# Patient Record
Sex: Female | Born: 1960 | Race: Black or African American | Hispanic: No | Marital: Single | Smoking: Never smoker
Health system: Southern US, Community
[De-identification: ages and names within clinical notes are randomized; demographics above are authoritative.]

## PROBLEM LIST (undated history)

## (undated) DIAGNOSIS — Z9289 Personal history of other medical treatment: Secondary | ICD-10-CM

## (undated) DIAGNOSIS — J302 Other seasonal allergic rhinitis: Secondary | ICD-10-CM

## (undated) DIAGNOSIS — R937 Abnormal findings on diagnostic imaging of other parts of musculoskeletal system: Secondary | ICD-10-CM

## (undated) DIAGNOSIS — E119 Type 2 diabetes mellitus without complications: Secondary | ICD-10-CM

## (undated) DIAGNOSIS — Z973 Presence of spectacles and contact lenses: Secondary | ICD-10-CM

## (undated) DIAGNOSIS — D869 Sarcoidosis, unspecified: Secondary | ICD-10-CM

## (undated) DIAGNOSIS — E785 Hyperlipidemia, unspecified: Secondary | ICD-10-CM

## (undated) DIAGNOSIS — IMO0001 Reserved for inherently not codable concepts without codable children: Secondary | ICD-10-CM

## (undated) DIAGNOSIS — M7590 Shoulder lesion, unspecified, unspecified shoulder: Secondary | ICD-10-CM

## (undated) DIAGNOSIS — D709 Neutropenia, unspecified: Secondary | ICD-10-CM

## (undated) DIAGNOSIS — M7520 Bicipital tendinitis, unspecified shoulder: Secondary | ICD-10-CM

## (undated) DIAGNOSIS — I10 Essential (primary) hypertension: Secondary | ICD-10-CM

## (undated) HISTORY — DX: Neutropenia, unspecified: D70.9

## (undated) HISTORY — DX: Sarcoidosis, unspecified: D86.9

## (undated) HISTORY — DX: Essential (primary) hypertension: I10

## (undated) HISTORY — DX: Hyperlipidemia, unspecified: E78.5

## (undated) HISTORY — DX: Shoulder lesion, unspecified, unspecified shoulder: M75.90

## (undated) HISTORY — PX: TONSILLECTOMY: SUR1361

## (undated) HISTORY — DX: Presence of spectacles and contact lenses: Z97.3

## (undated) HISTORY — DX: Bicipital tendinitis, unspecified shoulder: M75.20

## (undated) HISTORY — PX: BACK SURGERY: SHX140

## (undated) HISTORY — DX: Personal history of other medical treatment: Z92.89

## (undated) HISTORY — DX: Abnormal findings on diagnostic imaging of other parts of musculoskeletal system: R93.7

---

## 1985-12-26 HISTORY — PX: LUNG SURGERY: SHX703

## 1986-12-26 DIAGNOSIS — Z9289 Personal history of other medical treatment: Secondary | ICD-10-CM

## 1986-12-26 HISTORY — DX: Personal history of other medical treatment: Z92.89

## 1988-12-26 HISTORY — PX: ABDOMINAL HYSTERECTOMY: SHX81

## 1992-12-26 HISTORY — PX: REDUCTION MAMMAPLASTY: SUR839

## 2004-11-06 ENCOUNTER — Emergency Department (HOSPITAL_COMMUNITY): Admission: AD | Admit: 2004-11-06 | Discharge: 2004-11-06 | Payer: Self-pay | Admitting: Family Medicine

## 2004-11-19 ENCOUNTER — Emergency Department (HOSPITAL_COMMUNITY): Admission: EM | Admit: 2004-11-19 | Discharge: 2004-11-19 | Payer: Self-pay | Admitting: Family Medicine

## 2005-09-14 ENCOUNTER — Emergency Department (HOSPITAL_COMMUNITY): Admission: EM | Admit: 2005-09-14 | Discharge: 2005-09-14 | Payer: Self-pay | Admitting: Family Medicine

## 2006-04-20 ENCOUNTER — Encounter: Admission: RE | Admit: 2006-04-20 | Discharge: 2006-04-20 | Payer: Self-pay | Admitting: Family Medicine

## 2006-04-20 ENCOUNTER — Other Ambulatory Visit: Admission: RE | Admit: 2006-04-20 | Discharge: 2006-04-20 | Payer: Self-pay | Admitting: Family Medicine

## 2006-06-08 ENCOUNTER — Ambulatory Visit: Payer: Self-pay | Admitting: Family Medicine

## 2006-07-06 ENCOUNTER — Ambulatory Visit: Payer: Self-pay | Admitting: Family Medicine

## 2006-08-12 ENCOUNTER — Emergency Department (HOSPITAL_COMMUNITY): Admission: EM | Admit: 2006-08-12 | Discharge: 2006-08-12 | Payer: Self-pay | Admitting: Emergency Medicine

## 2007-03-07 ENCOUNTER — Ambulatory Visit: Payer: Self-pay | Admitting: Family Medicine

## 2007-03-26 ENCOUNTER — Ambulatory Visit: Payer: Self-pay | Admitting: Family Medicine

## 2007-04-24 ENCOUNTER — Ambulatory Visit: Payer: Self-pay | Admitting: Family Medicine

## 2007-05-14 ENCOUNTER — Ambulatory Visit: Payer: Self-pay | Admitting: Family Medicine

## 2007-05-14 ENCOUNTER — Other Ambulatory Visit: Admission: RE | Admit: 2007-05-14 | Discharge: 2007-05-14 | Payer: Self-pay | Admitting: Family Medicine

## 2007-05-14 LAB — HM PAP SMEAR: HM Pap smear: NEGATIVE

## 2007-10-31 ENCOUNTER — Emergency Department (HOSPITAL_COMMUNITY): Admission: EM | Admit: 2007-10-31 | Discharge: 2007-10-31 | Payer: Self-pay | Admitting: Emergency Medicine

## 2007-11-09 ENCOUNTER — Emergency Department (HOSPITAL_COMMUNITY): Admission: EM | Admit: 2007-11-09 | Discharge: 2007-11-09 | Payer: Self-pay | Admitting: Emergency Medicine

## 2007-11-13 ENCOUNTER — Ambulatory Visit: Payer: Self-pay | Admitting: Family Medicine

## 2007-11-27 ENCOUNTER — Ambulatory Visit: Payer: Self-pay | Admitting: Family Medicine

## 2007-11-29 ENCOUNTER — Emergency Department (HOSPITAL_COMMUNITY): Admission: EM | Admit: 2007-11-29 | Discharge: 2007-11-29 | Payer: Self-pay | Admitting: Emergency Medicine

## 2007-12-07 ENCOUNTER — Ambulatory Visit: Payer: Self-pay | Admitting: Family Medicine

## 2008-02-03 ENCOUNTER — Emergency Department (HOSPITAL_COMMUNITY): Admission: EM | Admit: 2008-02-03 | Discharge: 2008-02-03 | Payer: Self-pay | Admitting: Family Medicine

## 2008-02-06 ENCOUNTER — Ambulatory Visit: Payer: Self-pay | Admitting: Family Medicine

## 2008-03-13 ENCOUNTER — Ambulatory Visit: Payer: Self-pay | Admitting: Family Medicine

## 2008-03-27 ENCOUNTER — Ambulatory Visit: Payer: Self-pay | Admitting: Family Medicine

## 2008-08-29 ENCOUNTER — Ambulatory Visit: Payer: Self-pay | Admitting: Family Medicine

## 2008-10-13 ENCOUNTER — Ambulatory Visit: Payer: Self-pay | Admitting: Family Medicine

## 2008-11-12 ENCOUNTER — Ambulatory Visit: Payer: Self-pay | Admitting: Family Medicine

## 2009-04-23 ENCOUNTER — Ambulatory Visit: Payer: Self-pay | Admitting: Family Medicine

## 2009-09-10 ENCOUNTER — Emergency Department (HOSPITAL_COMMUNITY): Admission: EM | Admit: 2009-09-10 | Discharge: 2009-09-11 | Payer: Self-pay | Admitting: Emergency Medicine

## 2009-09-14 ENCOUNTER — Ambulatory Visit: Payer: Self-pay | Admitting: Family Medicine

## 2009-10-09 ENCOUNTER — Ambulatory Visit: Payer: Self-pay | Admitting: Family Medicine

## 2009-12-03 ENCOUNTER — Ambulatory Visit: Payer: Self-pay | Admitting: Family Medicine

## 2010-01-20 ENCOUNTER — Ambulatory Visit: Payer: Self-pay | Admitting: Family Medicine

## 2010-03-22 ENCOUNTER — Ambulatory Visit: Payer: Self-pay | Admitting: Physician Assistant

## 2010-03-25 ENCOUNTER — Ambulatory Visit: Payer: Self-pay | Admitting: Oncology

## 2010-04-08 LAB — MORPHOLOGY
PLT EST: ADEQUATE
RBC Comments: NORMAL

## 2010-04-08 LAB — CBC & DIFF AND RETIC
BASO%: 0.2 % (ref 0.0–2.0)
Basophils Absolute: 0 10*3/uL (ref 0.0–0.1)
EOS%: 2.3 % (ref 0.0–7.0)
Eosinophils Absolute: 0.1 10*3/uL (ref 0.0–0.5)
HCT: 35.9 % (ref 34.8–46.6)
HGB: 12.1 g/dL (ref 11.6–15.9)
Immature Retic Fract: 1.9 % (ref 0.00–10.70)
LYMPH%: 46.3 % (ref 14.0–49.7)
MCH: 29.2 pg (ref 25.1–34.0)
MCHC: 33.7 g/dL (ref 31.5–36.0)
MCV: 86.7 fL (ref 79.5–101.0)
MONO#: 0.3 10*3/uL (ref 0.1–0.9)
MONO%: 6.9 % (ref 0.0–14.0)
NEUT#: 1.9 10*3/uL (ref 1.5–6.5)
NEUT%: 44.3 % (ref 38.4–76.8)
Platelets: 317 10*3/uL (ref 145–400)
RBC: 4.14 10*6/uL (ref 3.70–5.45)
RDW: 13.7 % (ref 11.2–14.5)
Retic %: 1.07 % (ref 0.50–1.50)
Retic Ct Abs: 44.3 10*3/uL (ref 18.30–72.70)
WBC: 4.3 10*3/uL (ref 3.9–10.3)
lymph#: 2 10*3/uL (ref 0.9–3.3)

## 2010-04-08 LAB — COMPREHENSIVE METABOLIC PANEL WITH GFR
ALT: 17 U/L (ref 0–35)
AST: 23 U/L (ref 0–37)
Albumin: 4.3 g/dL (ref 3.5–5.2)
Alkaline Phosphatase: 56 U/L (ref 39–117)
BUN: 10 mg/dL (ref 6–23)
CO2: 31 meq/L (ref 19–32)
Calcium: 9.4 mg/dL (ref 8.4–10.5)
Chloride: 103 meq/L (ref 96–112)
Creatinine, Ser: 0.77 mg/dL (ref 0.40–1.20)
Glucose, Bld: 162 mg/dL — ABNORMAL HIGH (ref 70–99)
Potassium: 3.3 meq/L — ABNORMAL LOW (ref 3.5–5.3)
Sodium: 141 meq/L (ref 135–145)
Total Bilirubin: 0.6 mg/dL (ref 0.3–1.2)
Total Protein: 7.7 g/dL (ref 6.0–8.3)

## 2010-04-08 LAB — CHCC SMEAR

## 2010-04-08 LAB — LACTATE DEHYDROGENASE: LDH: 146 U/L (ref 94–250)

## 2010-04-14 LAB — KAPPA/LAMBDA LIGHT CHAINS
Kappa free light chain: 0.99 mg/dL (ref 0.33–1.94)
Kappa:Lambda Ratio: 0.81 (ref 0.26–1.65)
Lambda Free Lght Chn: 1.22 mg/dL (ref 0.57–2.63)

## 2010-04-14 LAB — IRON AND TIBC
%SAT: 17 % — ABNORMAL LOW (ref 20–55)
Iron: 62 ug/dL (ref 42–145)
TIBC: 375 ug/dL (ref 250–470)
UIBC: 313 ug/dL

## 2010-04-14 LAB — PROTEIN ELECTROPHORESIS, SERUM
Albumin ELP: 59 % (ref 55.8–66.1)
Alpha-1-Globulin: 6.3 % — ABNORMAL HIGH (ref 2.9–4.9)
Alpha-2-Globulin: 8.7 % (ref 7.1–11.8)
Beta 2: 4.4 % (ref 3.2–6.5)
Beta Globulin: 6.8 % (ref 4.7–7.2)
Gamma Globulin: 14.8 % (ref 11.1–18.8)
Total Protein, Serum Electrophoresis: 7.4 g/dL (ref 6.0–8.3)

## 2010-04-14 LAB — VITAMIN B12: Vitamin B-12: 313 pg/mL (ref 211–911)

## 2010-04-14 LAB — TSH: TSH: 2.331 u[IU]/mL (ref 0.350–4.500)

## 2010-04-14 LAB — FOLATE: Folate: 12.7 ng/mL

## 2010-04-14 LAB — FERRITIN: Ferritin: 54 ng/mL (ref 10–291)

## 2010-07-15 ENCOUNTER — Ambulatory Visit: Payer: Self-pay | Admitting: Oncology

## 2010-07-19 LAB — CBC WITH DIFFERENTIAL/PLATELET
BASO%: 0.2 % (ref 0.0–2.0)
Basophils Absolute: 0 10*3/uL (ref 0.0–0.1)
EOS%: 1.6 % (ref 0.0–7.0)
Eosinophils Absolute: 0.1 10*3/uL (ref 0.0–0.5)
HCT: 34.9 % (ref 34.8–46.6)
HGB: 11.8 g/dL (ref 11.6–15.9)
LYMPH%: 45.7 % (ref 14.0–49.7)
MCH: 29.1 pg (ref 25.1–34.0)
MCHC: 33.8 g/dL (ref 31.5–36.0)
MCV: 86.2 fL (ref 79.5–101.0)
MONO#: 0.4 10*3/uL (ref 0.1–0.9)
MONO%: 9.9 % (ref 0.0–14.0)
NEUT#: 1.8 10*3/uL (ref 1.5–6.5)
NEUT%: 42.6 % (ref 38.4–76.8)
Platelets: 300 10*3/uL (ref 145–400)
RBC: 4.05 10*6/uL (ref 3.70–5.45)
RDW: 13.5 % (ref 11.2–14.5)
WBC: 4.3 10*3/uL (ref 3.9–10.3)
lymph#: 2 10*3/uL (ref 0.9–3.3)

## 2010-07-19 LAB — CHCC SMEAR

## 2010-07-29 ENCOUNTER — Ambulatory Visit: Payer: Self-pay | Admitting: Physician Assistant

## 2010-10-04 ENCOUNTER — Encounter: Admission: RE | Admit: 2010-10-04 | Discharge: 2010-10-04 | Payer: Self-pay | Admitting: Family Medicine

## 2010-10-29 ENCOUNTER — Ambulatory Visit: Payer: Self-pay | Admitting: Family Medicine

## 2010-11-09 ENCOUNTER — Ambulatory Visit: Payer: Self-pay | Admitting: Oncology

## 2010-12-08 ENCOUNTER — Ambulatory Visit: Payer: Self-pay | Admitting: Family Medicine

## 2011-01-10 ENCOUNTER — Ambulatory Visit
Admission: RE | Admit: 2011-01-10 | Discharge: 2011-01-10 | Payer: Self-pay | Source: Home / Self Care | Attending: Family Medicine | Admitting: Family Medicine

## 2011-01-17 ENCOUNTER — Emergency Department (HOSPITAL_COMMUNITY)
Admission: EM | Admit: 2011-01-17 | Discharge: 2011-01-18 | Payer: Self-pay | Source: Home / Self Care | Admitting: Emergency Medicine

## 2011-01-21 ENCOUNTER — Ambulatory Visit
Admission: RE | Admit: 2011-01-21 | Discharge: 2011-01-21 | Payer: Self-pay | Source: Home / Self Care | Attending: Family Medicine | Admitting: Family Medicine

## 2011-01-26 DIAGNOSIS — M7520 Bicipital tendinitis, unspecified shoulder: Secondary | ICD-10-CM

## 2011-01-26 DIAGNOSIS — M7590 Shoulder lesion, unspecified, unspecified shoulder: Secondary | ICD-10-CM

## 2011-01-26 HISTORY — DX: Shoulder lesion, unspecified, unspecified shoulder: M75.90

## 2011-01-26 HISTORY — DX: Bicipital tendinitis, unspecified shoulder: M75.20

## 2011-03-11 ENCOUNTER — Other Ambulatory Visit: Payer: Self-pay | Admitting: Oncology

## 2011-03-11 ENCOUNTER — Encounter (HOSPITAL_BASED_OUTPATIENT_CLINIC_OR_DEPARTMENT_OTHER): Payer: 59 | Admitting: Oncology

## 2011-03-11 DIAGNOSIS — I10 Essential (primary) hypertension: Secondary | ICD-10-CM

## 2011-03-11 DIAGNOSIS — E119 Type 2 diabetes mellitus without complications: Secondary | ICD-10-CM

## 2011-03-11 DIAGNOSIS — D6489 Other specified anemias: Secondary | ICD-10-CM

## 2011-03-11 LAB — CBC WITH DIFFERENTIAL/PLATELET
BASO%: 0.1 % (ref 0.0–2.0)
EOS%: 0.8 % (ref 0.0–7.0)
Eosinophils Absolute: 0.1 10*3/uL (ref 0.0–0.5)
HCT: 37.6 % (ref 34.8–46.6)
LYMPH%: 32.9 % (ref 14.0–49.7)
MCH: 29.5 pg (ref 25.1–34.0)
MCHC: 35.1 g/dL (ref 31.5–36.0)
MONO#: 0.6 10*3/uL (ref 0.1–0.9)
NEUT%: 58.1 % (ref 38.4–76.8)
Platelets: 300 10*3/uL (ref 145–400)
RDW: 12.9 % (ref 11.2–14.5)
WBC: 7.3 10*3/uL (ref 3.9–10.3)
lymph#: 2.4 10*3/uL (ref 0.9–3.3)
nRBC: 0 % (ref 0–0)

## 2011-03-11 LAB — COMPREHENSIVE METABOLIC PANEL
ALT: 12 U/L (ref 0–35)
AST: 16 U/L (ref 0–37)
Albumin: 4.5 g/dL (ref 3.5–5.2)
Alkaline Phosphatase: 55 U/L (ref 39–117)
BUN: 18 mg/dL (ref 6–23)
CO2: 27 mEq/L (ref 19–32)
Chloride: 101 mEq/L (ref 96–112)
Creatinine, Ser: 0.86 mg/dL (ref 0.40–1.20)
Potassium: 3.7 mEq/L (ref 3.5–5.3)
Total Bilirubin: 0.7 mg/dL (ref 0.3–1.2)
Total Protein: 7 g/dL (ref 6.0–8.3)

## 2011-04-01 LAB — COMPREHENSIVE METABOLIC PANEL
ALT: 16 U/L (ref 0–35)
AST: 25 U/L (ref 0–37)
Albumin: 4.6 g/dL (ref 3.5–5.2)
Alkaline Phosphatase: 65 U/L (ref 39–117)
BUN: 14 mg/dL (ref 6–23)
CO2: 30 mEq/L (ref 19–32)
Calcium: 9.5 mg/dL (ref 8.4–10.5)
Chloride: 103 mEq/L (ref 96–112)
Creatinine, Ser: 0.76 mg/dL (ref 0.4–1.2)
GFR calc Af Amer: >60 mL/min (ref >60)
GFR calc non Af Amer: >60 mL/min (ref >60)
Glucose, Bld: 152 mg/dL — ABNORMAL HIGH (ref 70–99)
Potassium: 3.7 mEq/L (ref 3.5–5.1)
Sodium: 141 mEq/L (ref 135–145)
Total Bilirubin: 0.7 mg/dL (ref 0.3–1.2)
Total Protein: 8.1 g/dL (ref 6.0–8.3)

## 2011-04-01 LAB — POCT CARDIAC MARKERS
CKMB, poc: 1.1 ng/mL (ref 1.0–8.0)
Myoglobin, poc: 79.3 ng/mL (ref 12–200)
Troponin i, poc: <0.05 ng/mL (ref 0.00–0.09)

## 2011-04-01 LAB — CBC
HCT: 37.5 % (ref 36.0–46.0)
Hemoglobin: 12.7 g/dL (ref 12.0–15.0)
MCHC: 33.9 g/dL (ref 30.0–36.0)
MCV: 88.3 fL (ref 78.0–100.0)
Platelets: 285 10*3/uL (ref 150–400)
RBC: 4.25 MIL/uL (ref 3.87–5.11)
RDW: 13.6 % (ref 11.5–15.5)
WBC: 6.2 10*3/uL (ref 4.0–10.5)

## 2011-04-01 LAB — DIFFERENTIAL
Basophils Absolute: 0 10*3/uL (ref 0.0–0.1)
Basophils Relative: 1 % (ref 0–1)
Eosinophils Absolute: 0.1 10*3/uL (ref 0.0–0.7)
Eosinophils Relative: 2 % (ref 0–5)
Lymphocytes Relative: 50 % — ABNORMAL HIGH (ref 12–46)
Lymphs Abs: 3.1 10*3/uL (ref 0.7–4.0)
Monocytes Absolute: 0.4 10*3/uL (ref 0.1–1.0)
Monocytes Relative: 6 % (ref 3–12)
Neutro Abs: 2.6 10*3/uL (ref 1.7–7.7)
Neutrophils Relative %: 41 % — ABNORMAL LOW (ref 43–77)

## 2011-04-01 LAB — LIPASE, BLOOD: Lipase: 82 U/L — ABNORMAL HIGH (ref 11–59)

## 2011-04-01 LAB — GLUCOSE, CAPILLARY: Glucose-Capillary: 175 mg/dL — ABNORMAL HIGH (ref 70–99)

## 2011-05-20 ENCOUNTER — Encounter: Payer: Self-pay | Admitting: Family Medicine

## 2011-05-20 ENCOUNTER — Ambulatory Visit (INDEPENDENT_AMBULATORY_CARE_PROVIDER_SITE_OTHER): Payer: 59 | Admitting: Family Medicine

## 2011-05-20 ENCOUNTER — Telehealth: Payer: Self-pay | Admitting: Family Medicine

## 2011-05-20 VITALS — BP 110/70 | HR 72 | Temp 97.9°F | Ht 64.0 in | Wt 170.0 lb

## 2011-05-20 DIAGNOSIS — H9201 Otalgia, right ear: Secondary | ICD-10-CM

## 2011-05-20 DIAGNOSIS — H9209 Otalgia, unspecified ear: Secondary | ICD-10-CM

## 2011-05-20 NOTE — Patient Instructions (Signed)
Please call with name of ear drops you have recently been using, and we will let you know the plan later today  Otalgia (Earache) Otalgia is pain in or around the ear. When the pain is from the ear itself it is called primary otalgia. Pain may also be coming from somewhere else, like the head and neck. This is called secondary otalgia.  CAUSES Causes of primary otalgia include:  Middle ear infection.   It can also be caused by injury to the ear or infection of the ear canal (swimmer's ear). Swimmer's ear causes pain, swelling and often drainage from the ear canal.  Causes of secondary otalgia include:  Sinus infections.   Allergies and colds that cause stuffiness of the nose and tubes that drain the ears (eustachian tubes).   Dental problems like cavities, gum infections or teething.   Sore Throat (tonsillitis and pharyngitis).   Swollen glands in the neck.   Infection of the bone behind the ear (mastoiditis).   TMJ discomfort (problems with the joint between your jaw and your skull).   Other problems such as nerve disorders, circulation problems, heart disease and tumors of the head and neck can also cause symptoms of ear pain. This is rare.  DIAGNOSIS Evaluation, Diagnosis and Testing:  Examination by your medical caregiver is recommended to evaluate and diagnose the cause of otalgia.   Further testing or referral to a specialist may be indicated if the cause of the ear pain is not found and the symptom persists.  TREATMENT  Your doctor may prescribe antibiotics if an ear infection is diagnosed.   Pain relievers and topical analgesics may be recommended.   It is important to take all medications as prescribed.  HOME CARE INSTRUCTIONS  It may be helpful to sleep with the painful ear in the up position.   A warm compress over the painful ear may provide relief.   A soft diet and avoiding gum may help while ear pain is present.  SEEK IMMEDIATE MEDICAL CARE IF YOU  DEVELOP:  Severe pain, a high fever, repeated vomiting or dehydration.   Extreme dizziness, headache, confusion, ringing in the ears (tinnitus) or hearing loss.  Document Released: 01/19/2005 Document Re-Released: 11/30/2009 [facility] Patient Information 2011 [place], [place].

## 2011-05-20 NOTE — Telephone Encounter (Signed)
Med is generic Cortisporin otic.  Should be used 4 drops to affected ear 3-4x/day for a total of 10 days.  LM on pt's machine with this info, and to have her call back with which pharmacy she uses so that we can send in a refill so she has enough to complete a course

## 2011-05-20 NOTE — Progress Notes (Signed)
Subjective:    Patient ID: Bethany Webb, female    DOB: June 13, 1961, 50 y.o.   MRN: [id number]  HPI Patient presents with complaint of R ear pain for over a week.  Has been using ear drops left over from last year, helps some, but still hurting.  Doesn't remember the name of the drops; used it once or twice a day over the last week.  Begins with itching/scratching, starts to get sore, then becomes more painful.  Denies cold symptoms.  Allergies are controlled by her Zyrtec, using it daily.    Works at [employer]  Was tent camping over the last week  Past Medical History  Diagnosis Date  . Diabetes mellitus   . Hypertension   . Hyperlipidemia   . Allergy     Past Surgical History  Procedure Date  . Abdominal hysterectomy 1990    and BSO    History   Social History  . Marital Status: Single    Spouse Name: N/A    Number of Children: N/A  . Years of Education: N/A   Occupational History  . Not on file.   Social History Main Topics  . Smoking status: Never Smoker   . Smokeless tobacco: Never Used  . Alcohol Use: Yes     wine 1- 3 times a week  . Drug Use: No  . Sexually Active: Not on file   Other Topics Concern  . Not on file   Social History Narrative  . No narrative on file    History reviewed. No pertinent family history.  Current outpatient prescriptions:amLODipine (NORVASC) 10 MG tablet, Take 10 mg by mouth daily.  , Disp: , Rfl: ;  cetirizine (ZYRTEC) 10 MG tablet, Take 10 mg by mouth daily.  , Disp: , Rfl: ;  glipiZIDE (GLUCOTROL) 10 MG tablet, Take 10 mg by mouth 2 (two) times daily before a meal.  , Disp: , Rfl: ;  lisinopril-hydrochlorothiazide (PRINZIDE,ZESTORETIC) 20-25 MG per tablet, Take 1 tablet by mouth daily.  , Disp: , Rfl:  metFORMIN (GLUCOPHAGE) 500 MG tablet, Take 500 mg by mouth 2 (two) times daily with a meal.  , Disp: , Rfl: ;  rosuvastatin (CRESTOR) 10 MG tablet, Take 10 mg by mouth daily.  , Disp: , Rfl: ;  DISCONTD: simvastatin  (ZOCOR) 40 MG tablet, Take 40 mg by mouth at bedtime.  , Disp: , Rfl:   Allergies  Allergen Reactions  . Codeine Nausea Only   Review of Systems No fevers, nausea/vomiting/diarrhea, no skin rashes, no cough, SOB, sinus pressure.     Objective:   Physical Exam BP 110/70  Pulse 72  Temp(Src) 97.9 F (36.6 C) (Oral)  Ht 5\' 4"  (1.626 m)  Wt 170 lb (77.111 kg)  BMI 29.18 kg/m2 HEENT:  PERRL, EOMI, conjunctiva clear.  TM's normal bilaterally.  Minimal cerumen.  EAC's also appear normal bilaterally without any erythema, inflammation or exudates.  Mildly tender behind ear--no lymphadenopathy, tender over muscles Neck:  No lymphadenopathy or thyromegaly Heart:  Regular rate and rhythm without murmurs Lungs:  Clear bilaterally Extremities:  No CCE       Assessment & Plan:   1. Otalgia of right ear    No evidence of otitis media or externa on exam today.  May have partially treated OE.  Patient will call with name of drops she has been using.    Discussed ear drops with HC Discussed use of NSAIDs prn for neck (behind ear) pain Follow  up if fevers, worsening pain, other symptoms develop for re-evaluation.  Phone call from patient--on cortisporin otic.  See phone message. Recommended using at proper dose (4 ggts TID). F/u prn

## 2011-06-30 ENCOUNTER — Encounter: Payer: Self-pay | Admitting: Medical

## 2011-06-30 ENCOUNTER — Ambulatory Visit (INDEPENDENT_AMBULATORY_CARE_PROVIDER_SITE_OTHER): Payer: 59 | Admitting: Medical

## 2011-06-30 VITALS — BP 122/88 | HR 80 | Temp 98.2°F | Ht 64.0 in | Wt 169.0 lb

## 2011-06-30 DIAGNOSIS — T887XXA Unspecified adverse effect of drug or medicament, initial encounter: Secondary | ICD-10-CM

## 2011-06-30 DIAGNOSIS — D492 Neoplasm of unspecified behavior of bone, soft tissue, and skin: Secondary | ICD-10-CM

## 2011-06-30 DIAGNOSIS — IMO0002 Reserved for concepts with insufficient information to code with codable children: Secondary | ICD-10-CM

## 2011-06-30 DIAGNOSIS — H6690 Otitis media, unspecified, unspecified ear: Secondary | ICD-10-CM

## 2011-06-30 MED ORDER — AMOXICILLIN 875 MG PO TABS: 875.0000 mg | ORAL_TABLET | Freq: Two times a day (BID) | ORAL | Status: AC

## 2011-06-30 NOTE — Progress Notes (Signed)
Subjective:     Bethany Webb is a 50 y.o. female who presents with ear pain and possible ear infection. Symptoms include: right ear pain. Onset of symptoms was 1 month ago, and have been gradually worsening since that time. Associated symptoms include: achiness, sinus pressure and discomfort in right neck.  Patient denies: chills, fever  and sneezing. She is drinking plenty of fluids.  Treatment to date: finished course of cortisporin ear drops.  Denies sick contacts.  No other aggravating or relieving factors.    She also notes skin on tip of nose is darker than surrounding skin that has gotten bigger over months.  It doesn't bother her, but she has tried creams OTC unsuccessfully.    She notes chronic nausea and belly discomfort on Metformin.  Has been on other medications prior.    The following portions of the patient's history were reviewed and updated as appropriate: allergies, current medications, past family history, past medical history, past social history, past surgical history and problem list.  Past Medical History  Diagnosis Date  . Diabetes mellitus   . Hypertension   . Hyperlipidemia   . Allergy     Review of Systems Constitutional: denies chills, sweats, anorexia Skin: denies rash HEENT: +ear pain, denies hearing loss, sore throat, itchy watery eyes, sneezing Cardiovascular: denies chest pain Lungs: denies wheezing, SOB Abdomen: +nausea; denies abdominal pain, vomiting, diarrhea GU: denies dysuria  Objective:   Filed Vitals:   06/30/11 0811  BP: 122/88  Pulse: 80  Temp: 98.2 F (36.8 C)    General appearance: Alert, WD/WN, no distress, mildly ill appearing                             Skin: warm, no rash                           Head: no sinus tenderness                            Eyes: conjunctiva normal, corneas clear, PERRLA                            Ears: left TM normal, right TM retracted and mildly erythema, external ear canals normal              Nose: septum midline, turbinates swollen, with erythema and clear discharge             Mouth/throat: MMM, tongue normal, mild pharyngeal erythema                           Neck: tender on the right, supple, no adenopathy, no thyromegaly                          Heart: RRR, normal S1, S2, no murmurs                         Lungs: CTA bilaterally, no wheezes, rales, or rhonchi     Assessment:   Encounter Diagnoses  Name Primary?  . Otitis media Yes  . Neoplasm of skin of nose   . Adverse effect      Plan:   OM - Prescription for Amoxicillin given as below.  Discussed diagnosis and treatment of otitis media.  Suggested symptomatic OTC remedies.  Nasal saline spray for nasal congestion.  Tylenol or Ibuprofen OTC for fever and malaise.  Call/return in 2-3 days if symptoms aren't resolving.   Skin of nose - referral to derm for eval  Adverse effect - metformin causes her nausea frequently.  Advised she take the medication with food, return soon for diabetes checkup and discuss other medication options.

## 2011-08-22 ENCOUNTER — Other Ambulatory Visit: Payer: Self-pay | Admitting: Family Medicine

## 2011-08-24 ENCOUNTER — Other Ambulatory Visit: Payer: Self-pay

## 2011-09-16 ENCOUNTER — Telehealth: Payer: Self-pay | Admitting: Medical

## 2011-09-16 MED ORDER — ROSUVASTATIN CALCIUM 10 MG PO TABS: 10.0000 mg | ORAL_TABLET | Freq: Every day | ORAL | Status: DC

## 2011-09-16 NOTE — Telephone Encounter (Signed)
PATIENT WAS NOTIFIED THAT SHE WOULD NEED TO COME IN FOR A OFFICE VISIT BUT WE WILL REFILL HER CRESTOR FOR 30 DAYS. SHE AGREEDED. RX REILL WAS SENT IN. CLS

## 2011-10-03 LAB — BASIC METABOLIC PANEL
BUN: 11
Calcium: 9.3
GFR calc non Af Amer: >60
Glucose, Bld: 251 — ABNORMAL HIGH
Sodium: 136

## 2011-10-03 LAB — CBC
HCT: 36.8
MCHC: 35
MCV: 83.2
RBC: 4.42
WBC: 6.3

## 2011-10-03 LAB — DIFFERENTIAL
Basophils Relative: 1
Eosinophils Absolute: 0.1 — ABNORMAL LOW
Eosinophils Relative: 1
Lymphs Abs: 2.3
Monocytes Absolute: 0.4
Monocytes Relative: 7
Neutrophils Relative %: 55

## 2011-10-05 ENCOUNTER — Encounter: Payer: Self-pay | Admitting: Family Medicine

## 2011-10-06 ENCOUNTER — Ambulatory Visit (INDEPENDENT_AMBULATORY_CARE_PROVIDER_SITE_OTHER): Payer: 59 | Admitting: Medical

## 2011-10-06 ENCOUNTER — Encounter: Payer: Self-pay | Admitting: Medical

## 2011-10-06 VITALS — BP 120/80 | HR 84 | Temp 98.2°F | Resp 20 | Ht 64.0 in | Wt 168.0 lb

## 2011-10-06 DIAGNOSIS — M791 Myalgia, unspecified site: Secondary | ICD-10-CM

## 2011-10-06 DIAGNOSIS — Z1211 Encounter for screening for malignant neoplasm of colon: Secondary | ICD-10-CM

## 2011-10-06 DIAGNOSIS — E118 Type 2 diabetes mellitus with unspecified complications: Secondary | ICD-10-CM | POA: Insufficient documentation

## 2011-10-06 DIAGNOSIS — I10 Essential (primary) hypertension: Secondary | ICD-10-CM

## 2011-10-06 DIAGNOSIS — E1159 Type 2 diabetes mellitus with other circulatory complications: Secondary | ICD-10-CM | POA: Insufficient documentation

## 2011-10-06 DIAGNOSIS — E119 Type 2 diabetes mellitus without complications: Secondary | ICD-10-CM

## 2011-10-06 DIAGNOSIS — I152 Hypertension secondary to endocrine disorders: Secondary | ICD-10-CM | POA: Insufficient documentation

## 2011-10-06 DIAGNOSIS — M79603 Pain in arm, unspecified: Secondary | ICD-10-CM

## 2011-10-06 DIAGNOSIS — E785 Hyperlipidemia, unspecified: Secondary | ICD-10-CM

## 2011-10-06 DIAGNOSIS — M79609 Pain in unspecified limb: Secondary | ICD-10-CM

## 2011-10-06 DIAGNOSIS — R5381 Other malaise: Secondary | ICD-10-CM

## 2011-10-06 DIAGNOSIS — IMO0001 Reserved for inherently not codable concepts without codable children: Secondary | ICD-10-CM

## 2011-10-06 DIAGNOSIS — R5383 Other fatigue: Secondary | ICD-10-CM

## 2011-10-06 DIAGNOSIS — E1169 Type 2 diabetes mellitus with other specified complication: Secondary | ICD-10-CM | POA: Insufficient documentation

## 2011-10-06 LAB — POCT URINALYSIS DIPSTICK
Blood, UA: NEGATIVE
Glucose, UA: NEGATIVE
Ketones, UA: NEGATIVE
Protein, UA: NEGATIVE
Spec Grav, UA: 1.015

## 2011-10-06 LAB — CBC WITH DIFFERENTIAL/PLATELET
Basophils Absolute: 0 10*3/uL (ref 0.0–0.1)
Eosinophils Relative: 2 % (ref 0–5)
HCT: 37 % (ref 36.0–46.0)
Hemoglobin: 12.3 g/dL (ref 12.0–15.0)
Lymphocytes Relative: 46 % (ref 12–46)
Lymphs Abs: 1.4 10*3/uL (ref 0.7–4.0)
MCV: 87.5 fL (ref 78.0–100.0)
Monocytes Absolute: 0.2 10*3/uL (ref 0.1–1.0)
Neutro Abs: 1.3 10*3/uL — ABNORMAL LOW (ref 1.7–7.7)
RBC: 4.23 MIL/uL (ref 3.87–5.11)
RDW: 14.1 % (ref 11.5–15.5)
WBC: 3 10*3/uL — ABNORMAL LOW (ref 4.0–10.5)

## 2011-10-06 LAB — COMPREHENSIVE METABOLIC PANEL
BUN: 15 mg/dL (ref 6–23)
CO2: 28 mEq/L (ref 19–32)
Calcium: 9.4 mg/dL (ref 8.4–10.5)
Chloride: 103 mEq/L (ref 96–112)
Creat: 0.78 mg/dL (ref 0.50–1.10)
Glucose, Bld: 179 mg/dL — ABNORMAL HIGH (ref 70–99)
Total Bilirubin: 0.9 mg/dL (ref 0.3–1.2)

## 2011-10-06 LAB — LIPID PANEL
HDL: 45 mg/dL (ref >39)
LDL Cholesterol: 100 mg/dL — ABNORMAL HIGH (ref 0–99)
Total CHOL/HDL Ratio: 3.5 Ratio
Triglycerides: 62 mg/dL (ref <150)

## 2011-10-06 LAB — HEMOGLOBIN A1C: Hgb A1c MFr Bld: 7.2 % — ABNORMAL HIGH (ref <5.7)

## 2011-10-06 LAB — CK: Total CK: 136 U/L (ref 7–177)

## 2011-10-06 LAB — TSH: TSH: 1.697 u[IU]/mL (ref 0.350–4.500)

## 2011-10-06 MED ORDER — ROSUVASTATIN CALCIUM 10 MG PO TABS: 10.0000 mg | ORAL_TABLET | Freq: Every day | ORAL | Status: DC

## 2011-10-06 MED ORDER — METFORMIN HCL 500 MG PO TABS: 500.0000 mg | ORAL_TABLET | Freq: Two times a day (BID) | ORAL | Status: DC

## 2011-10-06 MED ORDER — GLIPIZIDE 10 MG PO TABS: 10.0000 mg | ORAL_TABLET | Freq: Every day | ORAL | Status: DC

## 2011-10-06 MED ORDER — CETIRIZINE HCL 10 MG PO TABS: 10.0000 mg | ORAL_TABLET | Freq: Every day | ORAL | Status: DC

## 2011-10-06 MED ORDER — LISINOPRIL-HYDROCHLOROTHIAZIDE 20-25 MG PO TABS: 1.0000 | ORAL_TABLET | Freq: Every day | ORAL | Status: DC

## 2011-10-06 NOTE — Progress Notes (Signed)
Subjective:     Bethany Webb is an 50 y.o. female who presents for follow up of diabetes. Current symptoms include: none. Patient denies foot ulcerations, paresthesia of the feet, polydipsia, polyuria and visual disturbances.  Home sugars: usually 120-150s, but one recent 55 after not eating supper.  Last dilated eye exam May 2012.  She declines flu and pneumococcal vaccines.  She did have Hep B vaccine series in remote past.    Patient here for follow-up of elevated blood pressure.  She is exercising and is adherent to a low-salt diet.  Blood pressure is well controlled at home. Cardiac symptoms: none. Patient denies: chest pain and palpitations. Cardiovascular risk factors: diabetes mellitus, dyslipidemia and hypertension. Use of agents associated with hypertension: none. History of target organ damage: none.   Bethany Webb is here for follow up of elevated cholesterol. Compliance with treatment has been excellent. The patient exercises several times per week. Patient denies muscle pain associated with her medications.  She has never had colonoscopy and is agreeable.  She wants to see a doctor in [place].   She notes ongoing aches in her right elbow, right arm, right shoulder and upper back.  Last year she had this on the left.  She gets occasional pain that comes down her right neck to upper arm.  She notes seeing [facility] for this in the past year with MRI, xrays, and no definitive diagnosis found.  She has been looking on the Internet and thinks she has fibromyalgia.  Using OTC menthol pain patches with some relief.   The following portions of the patient's history were reviewed and updated as appropriate: allergies, current medications, past family history, past medical history, past social history, past surgical history and problem list.  Past Medical History  Diagnosis Date  . Diabetes mellitus   . Hypertension   . Hyperlipidemia   . Allergy     Review of  Systems Constitutional: +fatigue; denies recent fevers, chills, weight changes, anorexia Allergy: denies recent allergy problems, nasal congestion, sneezing Skin: denies foot lesions, rash, or other worrisome lesion ENT: denies cough, runny nose, sore throat, hoarseness Cardiology: denies chest pain, palpitations, edema, orthopnea, paroxysmal nocturnal dyspnea Respiratory: denies shortness of breath, dyspnea on exertion, wheezing Gastroenterology: no abdominal pain, nausea, vomiting, diarrhea, constipation, bowel change, blood in stool Musculoskeletal: +myalgias, upper back pain, right arm pain Opthalmology: denies vision change, blurred vision, allergy symptoms Urology: denies dysuria, frequency, urgency, blood in urine, stream changes Neurology: denies numbness, tingling, weakness, gait changes   Objective:   Filed Vitals:   10/06/11 0911  BP: 120/80  Pulse: 84  Temp: 98.2 F (36.8 C)  Resp: 20    General Appearance:    Alert, no distress   HEENT:    Normocephalic, without obvious abnormality, PERRLA, conjunctiva/corneas clear, EOM intact, fundi benign, TMs and external ears normal, nares patent, mucosa normal, pharynx normal appearing  Oral cavity:   Lips, mucosa, and tongue normal; teeth and gums normal  Neck:   Supple, no lymphadenopathy;  thyroid:  no    enlargement/tenderness/nodules; no carotid   bruit or JVD  Lungs:     Clear to auscultation bilaterally without wheezes, rales or       rhonchi; respirations unlabored   Heart:    Regular rate and rhythm, S1 and S2 normal, no murmur  Abdomen:     Soft, non-tender, non distended, normoactive bowel sounds,    no masses, no hepatosplenomegaly MSK: tender along right arm along olecranon,  distal tricep, medial forearm, tender along right upper back and deltoid, but no obvious joint tenderness, no obvious spasm,  otherwise UE and LE exam unremarkable  Extremities:   No clubbing, cyanosis or edema  Pulses:   2+ and symmetric all  extremities  Skin:   Warm, dry, normal turgor, specifically - no foot lesions  Neurologic:   monofilament exam of feet normal          Psych:   Normal mood, affect, hygiene and grooming.     Assessment:   Encounter Diagnoses  Name Primary?  . Type II or unspecified type diabetes mellitus without mention of complication, not stated as uncontrolled Yes  . Hyperlipidemia   . Essential hypertension, benign   . Myalgia   . Fatigue   . Arm pain   . Screen for colon cancer      Plan:  DM type II - c/t same medications, refilled meds today, advised daily foot checks, check glucose fasting daily.   Hemoglobin A1C lab today.   Last micro albumin within the year is normal  Hyperlipidemia - refilled medications, labs today.  HTN - controlled on current medications.  Myalgia - labs.  Advised massage, routine exercise including walking and stretching and strength training.  Consider Capsaicin cream.   We discussed other medications such as Cymbatla but she wants to hold off on this.   We will send for prior records from [facility].  Fatigue - labs  Arm pain - pending ortho records  Screen for Colon Cancer - she will call back with name of GI physician in [facility] she wants to be referred to.

## 2011-10-06 NOTE — Patient Instructions (Signed)
Consider OTC Capsacian cream to the painful areas.   Exercise daily with walking and some time of upper body exercise such as Yoga, stretching, and consider strength training to work the muscles.   Call back with info on where you want to go for colonoscopy.  We will call with lab results.   I refilled your medications today.  Next visit we need to do breast and pelvic exam, and set you up for mammogram.  You are due at this time for mammogram.

## 2011-10-12 ENCOUNTER — Other Ambulatory Visit: Payer: Self-pay | Admitting: Medical

## 2011-10-12 ENCOUNTER — Other Ambulatory Visit: Payer: Self-pay | Admitting: Family Medicine

## 2011-10-12 MED ORDER — METFORMIN HCL 850 MG PO TABS: 850.0000 mg | ORAL_TABLET | Freq: Two times a day (BID) | ORAL | Status: DC

## 2011-10-12 MED ORDER — AMLODIPINE BESYLATE 10 MG PO TABS: 10.0000 mg | ORAL_TABLET | Freq: Every day | ORAL | Status: DC

## 2011-10-18 ENCOUNTER — Ambulatory Visit (INDEPENDENT_AMBULATORY_CARE_PROVIDER_SITE_OTHER): Payer: 59 | Admitting: Medical

## 2011-10-18 ENCOUNTER — Encounter: Payer: Self-pay | Admitting: Medical

## 2011-10-18 VITALS — BP 120/70 | HR 72 | Temp 98.3°F | Resp 16 | Wt 168.0 lb

## 2011-10-18 DIAGNOSIS — H1045 Other chronic allergic conjunctivitis: Secondary | ICD-10-CM

## 2011-10-18 DIAGNOSIS — J309 Allergic rhinitis, unspecified: Secondary | ICD-10-CM

## 2011-10-18 DIAGNOSIS — H1010 Acute atopic conjunctivitis, unspecified eye: Secondary | ICD-10-CM | POA: Insufficient documentation

## 2011-10-18 MED ORDER — OLOPATADINE HCL 0.2 % OP SOLN: 1.0000 [drp] | Freq: Every day | OPHTHALMIC | Status: DC

## 2011-10-18 NOTE — Progress Notes (Signed)
Subjective:   HPI  Bethany Webb is a 50 y.o. female who presents for 1 day c/o pink eye.  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  She notes this morning she had some pus and clear eye discharge, irritation, and was worried about pink eye infection.  She also has allergies, on Zyrtec daily with relatively good control.   No sick contacts. No other aggravating or relieving factors.  No other c/o.  She is [place] to the mountains [place] this [place].     The following portions of the patient's history were reviewed and updated as appropriate: allergies, current medications, past family history, past medical history, past social history, past surgical history and problem list.  Past Medical History  Diagnosis Date  . Diabetes mellitus   . Hypertension   . Hyperlipidemia   . Allergy     Review of Systems Constitutional: -fever, -chills, -sweats, -unexpected -weight change,-fatigue ENT: +runny nose, -ear pain, -sore throat Cardiology:  -chest pain, -palpitations, -edema Respiratory: -cough, -shortness of breath, -wheezing Gastroenterology: -abdominal pain, -nausea, -vomiting, -diarrhea, -constipation Hematology: -bleeding or bruising problems Musculoskeletal: -arthralgias, -myalgias, -joint swelling, -back pain Ophthalmology: +vision changes Urology: -dysuria, -difficulty urinating, -hematuria, -urinary frequency, -urgency Neurology: -headache, -weakness, -tingling, -numbness     Objective:   Physical Exam  Filed Vitals:   10/18/11 1604  BP: 120/70  Pulse: 72  Temp: 98.3 F (36.8 C)  Resp: 16    General appearance: alert, no distress, WD/WN Eye: bilat conjunctiva with clear discharge, but no erythema, on inflammation, swelling, crusting, PERRLA, EOMi, 20/20 left eye and 20/25 right eye corrected  HEENT: normocephalic, sclerae anicteric, TMs pearly, nares patent, no discharge or erythema, pharynx normal Oral  cavity: MMM, no lesions Neck: supple, no lymphadenopathy, no thyromegaly, no masses   Assessment and Plan :    Encounter Diagnoses  Name Primary?  . Allergic conjunctivitis Yes  . Allergic rhinitis    C/t Zyrtec daily, daily nasal saline, script for Pataday eye drops.   Call/return if worse or not improving.

## 2011-10-28 ENCOUNTER — Telehealth: Payer: Self-pay | Admitting: Medical

## 2011-11-01 ENCOUNTER — Other Ambulatory Visit: Payer: Self-pay | Admitting: Medical

## 2011-11-01 MED ORDER — AZELASTINE HCL 0.05 % OP SOLN: 1.0000 [drp] | Freq: Two times a day (BID) | OPHTHALMIC | Status: DC

## 2011-11-01 MED ORDER — PRAVASTATIN SODIUM 80 MG PO TABS: 80.0000 mg | ORAL_TABLET | Freq: Every evening | ORAL | Status: DC

## 2011-11-01 NOTE — Telephone Encounter (Signed)
Patient was notified that her medication was called intot the pharmacy. CLS

## 2011-11-01 NOTE — Telephone Encounter (Signed)
i had already handwrote on Thursday on the pharmacy paper to change to optivar.  I resent electronic script for Optivar today, and I sent Pravachol 80mg .  Thus, when she runs out of Crestor, lets switch to Pravachol 80mg  daily at bedtime.  Recheck in 55mo on cholesterol given the change to pravachol.

## 2012-01-06 ENCOUNTER — Ambulatory Visit (INDEPENDENT_AMBULATORY_CARE_PROVIDER_SITE_OTHER): Payer: 59 | Admitting: Medical

## 2012-01-06 ENCOUNTER — Encounter: Payer: Self-pay | Admitting: Medical

## 2012-01-06 VITALS — BP 130/80 | HR 78 | Temp 97.7°F | Resp 16 | Wt 165.0 lb

## 2012-01-06 DIAGNOSIS — J069 Acute upper respiratory infection, unspecified: Secondary | ICD-10-CM | POA: Insufficient documentation

## 2012-01-06 MED ORDER — BECLOMETHASONE DIPROPIONATE 80 MCG/ACT NA AERS: 1.0000 | INHALATION_SPRAY | Freq: Two times a day (BID) | NASAL | Status: DC

## 2012-01-06 MED ORDER — AMOXICILLIN 875 MG PO TABS: 875.0000 mg | ORAL_TABLET | Freq: Two times a day (BID) | ORAL | Status: DC

## 2012-01-06 NOTE — Progress Notes (Signed)
Subjective:  Bethany Webb is a 51 y.o. female who presents for 1 week hx/o nasal congestion, stuffy nose, sinus pressure, sneezing, headache, runny nose, subjective fever, malaise.  Denies body aches, sore throat, ear pain.  Using Ibuprofen, Zyrtec, green tea and water.  Using OTC nasal spray which helps some. Using Vicks vapor rub on nose.  Sick contacts at work. No other aggravating or relieving factors.  No other c/o.  Past Medical History  Diagnosis Date  . Diabetes mellitus   . Hypertension   . Hyperlipidemia   . Allergy    ROS: Gen: +subjective fever, chills, sweat Lungs: no SOB, wheezing Heart: no CP, palpitations. GI: +occasional nausea, occasional vomiting    Objective:   Filed Vitals:   01/06/12 0812  BP: 130/80  Pulse: 78  Temp: 97.7 F (36.5 C)  Resp: 16    General appearance: Alert, WD/WN, no distress, mildly ill appearing                             Skin: warm, no rash                           Head: mild sinus tenderness                            Eyes: conjunctiva normal, corneas clear, PERRLA                            Ears: pearly TMs, external ear canals normal                          Nose: septum midline, turbinates swollen, with erythema and clear discharge             Mouth/throat: MMM, tongue normal, mild pharyngeal erythema                           Neck: supple, no adenopathy, no thyromegaly, nontender                          Heart: RRR, normal S1, S2, no murmurs                         Lungs: CTA bilaterally, no wheezes, rales, or rhonchi     Assessment and Plan:   Encounter Diagnosis  Name Primary?  . Viral URI Yes   Advised that she currently has a viral cold.   Advised she continue drinking plenty of fluids, rest, you can use nasal saline spray, or you can try Neti Pot nasal saline flush OTC.  Can continue Zyrtec OTC, can continue Ibuprofen, consider Mucinex DM, Mucinex plain or Coricidin HBP for cough and congestion.  For nasal  congestion, you can get relief with either Afrin nasal spray OTC for 3 days or less, or try the nasal spray sample Qnasl 1-2 sprays per nostril once to twice daily.  Worse over the weekend with fever, worse nasal congestion sinus pressure with colored discharge, can consider beginning amoxicillin. Prescription given just in case.

## 2012-01-06 NOTE — Patient Instructions (Signed)
You have a viral cold.   Continue drinking plenty of fluids, rest, you can use nasal saline spray, or you can try Neti Pot nasal saline flush OTC.  You can continue Zyrtec OTC, can continue Ibuprofen, consider Mucinex DM, Mucinex plain or Coricidin HBP for cough and congestion.   For nasal congestion, you can get relief with either Afrin nasal spray OTC for 3 days or less, or try the nasal spray sample Qnasl 1-2 sprays per nostril once to twice daily.    Upper Respiratory Infection, Adult An upper respiratory infection (URI) is also known as the common cold. It is often caused by a type of germ (virus). Colds are easily spread (contagious). You can pass it to others by kissing, coughing, sneezing, or drinking out of the same glass. Usually, you get better in 1 or 2 weeks.  HOME CARE   Only take medicine as told by your doctor.   Use a warm mist humidifier or breathe in steam from a hot shower.   Drink enough water and fluids to keep your pee (urine) clear or pale yellow.   Get plenty of rest.   Return to work when your temperature is back to normal or as told by your doctor. You may use a face mask and wash your hands to stop your cold from spreading.  GET HELP RIGHT AWAY IF:   After the first few days, you feel you are getting worse.   You have questions about your medicine.   You have chills, shortness of breath, or brown or red spit (mucus).   You have yellow or brown snot (nasal discharge) or pain in the face, especially when you bend forward.   You have a fever, puffy (swollen) neck, pain when you swallow, or white spots in the back of your throat.   You have a bad headache, ear pain, sinus pain, or chest pain.   You have a high-pitched whistling sound when you breathe in and out (wheezing).   You have a lasting cough or cough up blood.   You have sore muscles or a stiff neck.  MAKE SURE YOU:   Understand these instructions.   Will watch your condition.   Will get  help right away if you are not doing well or get worse.  Document Released: 05/30/2008 Document Revised: 08/24/2011 Document Reviewed: 04/18/2011 [facility] Patient Information 2012 [place], [place].

## 2012-03-02 ENCOUNTER — Encounter: Payer: Self-pay | Admitting: Internal Medicine

## 2012-03-06 HISTORY — PX: COLONOSCOPY: SHX174

## 2012-03-09 ENCOUNTER — Ambulatory Visit (INDEPENDENT_AMBULATORY_CARE_PROVIDER_SITE_OTHER): Payer: 59 | Admitting: Medical

## 2012-03-09 ENCOUNTER — Encounter: Payer: Self-pay | Admitting: Medical

## 2012-03-09 ENCOUNTER — Ambulatory Visit: Payer: 59 | Admitting: Medical

## 2012-03-09 VITALS — BP 110/80 | HR 80 | Temp 98.0°F | Resp 16 | Wt 165.0 lb

## 2012-03-09 DIAGNOSIS — Z79899 Other long term (current) drug therapy: Secondary | ICD-10-CM

## 2012-03-09 DIAGNOSIS — J309 Allergic rhinitis, unspecified: Secondary | ICD-10-CM

## 2012-03-09 DIAGNOSIS — E119 Type 2 diabetes mellitus without complications: Secondary | ICD-10-CM

## 2012-03-09 DIAGNOSIS — E785 Hyperlipidemia, unspecified: Secondary | ICD-10-CM

## 2012-03-09 DIAGNOSIS — I10 Essential (primary) hypertension: Secondary | ICD-10-CM

## 2012-03-09 NOTE — Progress Notes (Signed)
Subjective: Here for recheck on diabetes, cholesterol, blood pressure.  Been doing well, compliant with medications, exercising, trying to eat healthy.  At last visit we increased her metformin to 850 twice a day, but she continues to have a lot of problems with diarrhea with the metformin.  She also had diarrhea with metformin on the 500 mg dose.  He continues on glipizide 10 mg once a day, Pravachol 80. We had switched from name brand to generic over 6 months ago due to cost.  She was checking her glucose twice a day, fasting morning readings usually from 90s to 150s.  No other complaint.  Since last visit she did have a colonoscopy with Dr. Jason Fila in [place], had one polyp, pathology still pending.  The following portions of the patient's history were reviewed and updated as appropriate: allergies, current medications, past family history, past medical history, past social history, past surgical history and problem list.  Past Medical History  Diagnosis Date  . Diabetes mellitus   . Hypertension   . Hyperlipidemia   . Allergy     Review of Systems As noted in history of present illness    Objective:   Physical Exam  Filed Vitals:   03/09/12 1335  BP: 110/80  Pulse: 80  Temp: 98 F (36.7 C)  Resp: 16    General appearance: alert, no distress, WD/WN HEENT: normocephalic, sclerae anicteric, TMs pearly, nares with boggy turbinates, clear discharge, pharynx normal Oral cavity: MMM, no lesions Neck: supple, no lymphadenopathy, no thyromegaly, no masses Heart: RRR, normal S1, S2, no murmurs Lungs: CTA bilaterally, no wheezes, rhonchi, or rales Pulses: 2+ symmetric   Assessment and Plan :    Encounter Diagnoses  Name Primary?  . Type II or unspecified type diabetes mellitus without mention of complication, not stated as uncontrolled Yes  . Hyperlipidemia   . Essential hypertension, benign   . Encounter for long-term (current) use of other medications   . Allergic  rhinitis    She will come in for fasting labs on Monday.  Continue same medications for now, healthy diet, exercise.  Blood pressure well controlled. We'll likely need to switch to a different version of metformin for different medication given the diarrhea.  Advise she take Zyrtec 5 mg nightly since 10 mg dries her out, and asked her to use the Qnasal nasal spray I gave her last visit to help with her allergies.

## 2012-03-12 ENCOUNTER — Other Ambulatory Visit: Payer: 59

## 2012-03-26 ENCOUNTER — Other Ambulatory Visit: Payer: 59

## 2012-03-26 DIAGNOSIS — E119 Type 2 diabetes mellitus without complications: Secondary | ICD-10-CM

## 2012-03-26 DIAGNOSIS — E785 Hyperlipidemia, unspecified: Secondary | ICD-10-CM

## 2012-03-26 DIAGNOSIS — Z79899 Other long term (current) drug therapy: Secondary | ICD-10-CM

## 2012-03-26 LAB — LIPID PANEL: Cholesterol: 142 mg/dL (ref 0–200)

## 2012-03-26 LAB — ALT: ALT: 14 U/L (ref 0–35)

## 2012-03-27 ENCOUNTER — Encounter: Payer: Self-pay | Admitting: Medical

## 2012-03-27 ENCOUNTER — Ambulatory Visit (INDEPENDENT_AMBULATORY_CARE_PROVIDER_SITE_OTHER): Payer: 59 | Admitting: Medical

## 2012-03-27 VITALS — BP 130/90 | HR 68 | Temp 98.9°F | Resp 16 | Wt 162.0 lb

## 2012-03-27 DIAGNOSIS — M25511 Pain in right shoulder: Secondary | ICD-10-CM

## 2012-03-27 DIAGNOSIS — E785 Hyperlipidemia, unspecified: Secondary | ICD-10-CM

## 2012-03-27 DIAGNOSIS — I10 Essential (primary) hypertension: Secondary | ICD-10-CM

## 2012-03-27 DIAGNOSIS — E119 Type 2 diabetes mellitus without complications: Secondary | ICD-10-CM

## 2012-03-27 DIAGNOSIS — M25519 Pain in unspecified shoulder: Secondary | ICD-10-CM

## 2012-03-27 LAB — HEMOGLOBIN A1C
Hgb A1c MFr Bld: 6.4 % — ABNORMAL HIGH (ref <5.7)
Mean Plasma Glucose: 137 mg/dL — ABNORMAL HIGH (ref <117)

## 2012-03-27 MED ORDER — NAPROXEN SODIUM ER 750 MG PO TB24: 1.0000 | ORAL_TABLET | Freq: Every day | ORAL | Status: DC

## 2012-03-27 NOTE — Progress Notes (Signed)
  Subjective:   HPI  Bethany Webb is a 51 y.o. female who presents with shoulder pain.  She denies specific injury or trauma, but has been lifting some boxes above her head and taking out the trash, doing routine chores around the house routines, but the shoulder started flaring up over the last several days.  She wonders if this is fibromyalgia.  The pain is mostly right neck, upper back, right sided radiating pains, pain from neck to forearm.  She does get some tingling the first 1-3 fingers on the right.   She has had issues with the left shoulder prior, saw therapy for this.  Here to discuss her recent lab results.  No other c/o.  The following portions of the patient's history were reviewed and updated as appropriate: allergies, current medications, past family history, past medical history, past social history, past surgical history and problem list.  Past Medical History  Diagnosis Date  . Diabetes mellitus   . Hypertension   . Hyperlipidemia   . Allergy     Allergies  Allergen Reactions  . Codeine Nausea Only   Review of Systems ROS reviewed and was negative other than noted in HPI or above.    Objective:   Physical Exam  General appearance: alert, no distress, WD/WN Oral cavity: MMM, no lesions Neck: somewhat guarded and decreased ROM due to pain, no lymphadenopathy, no thyromegaly, no masses MSK: tender throughout right UE, somewhat out of proportion to expected exam, even minimal palpation of right AC joint, anterior shoulder upper arm, distal right clavicle all with tenderness, pain with passive ROM, and passive and active ROM limited to about 15% of normal in all planes.  No forearm, wrist or hand pain on the right.  Left shoulder with mild tenderness, but otherwise good RO Neuro: normal sensation and strength of UE Pulses: 2+ symmetric, upper extremities, normal cap refill   Assessment and Plan :     Encounter Diagnoses  Name Primary?  . Shoulder pain, bilateral  Yes  . Type II or unspecified type diabetes mellitus without mention of complication, not stated as uncontrolled   . Hyperlipidemia   . Essential hypertension, benign    Shoulder pain, bilat, R>L - etiology today likely bursitis.   Advised rest, ice gentle stretching and ROM as pain improves, begin samples of Naprelan 750mg  daily x 2 days, then 500mg  daily x 4 days, call report by Friday.  Discussed her recent labs which all looked fine for ALT, HgbA1C 6.4%, lipid panel.  C/t same medications, diabetic diet, and exercise in general.

## 2012-03-29 ENCOUNTER — Ambulatory Visit (INDEPENDENT_AMBULATORY_CARE_PROVIDER_SITE_OTHER): Payer: 59 | Admitting: Family Medicine

## 2012-03-29 ENCOUNTER — Encounter: Payer: Self-pay | Admitting: Family Medicine

## 2012-03-29 ENCOUNTER — Telehealth: Payer: Self-pay | Admitting: Internal Medicine

## 2012-03-29 VITALS — BP 130/84 | HR 72 | Ht 64.0 in | Wt 162.0 lb

## 2012-03-29 DIAGNOSIS — M62838 Other muscle spasm: Secondary | ICD-10-CM

## 2012-03-29 DIAGNOSIS — M25519 Pain in unspecified shoulder: Secondary | ICD-10-CM

## 2012-03-29 MED ORDER — TRAMADOL HCL 50 MG PO TABS: 50.0000 mg | ORAL_TABLET | Freq: Four times a day (QID) | ORAL | Status: DC | PRN

## 2012-03-29 MED ORDER — CYCLOBENZAPRINE HCL 10 MG PO TABS: 10.0000 mg | ORAL_TABLET | Freq: Three times a day (TID) | ORAL | Status: AC | PRN

## 2012-03-29 NOTE — Patient Instructions (Signed)
Please use heat and/or ice at least three times daily.  Do some range of motion exercises for both your neck, AND your shoulder.  It is important to keep the shoulder moving to avoid it getting "frozen"/stiff.  Continue the naprelan as prescribed by [facility].  This might take a week or so to really start helping.  Call next week if not improving, and we can refer you to physical therapy.  We can call in rx for naprosyn if you're doing well with the samples.

## 2012-03-29 NOTE — Telephone Encounter (Signed)
Error, pt decided to come back in about shoulder pain

## 2012-03-29 NOTE — Progress Notes (Signed)
Presents for re-evaluation of R shoulder pain.  Started 4 days ago, rapidly progressed. Unable to lift arm.  Seen on Tuesday and given Naprelan.  Didn't take anything today, but too 750 mg for the last 2 days, eased the pain just a little bit. Thumb is twitching.  Yesterday had some numbness in her R hand, but no numbness today.  Earlier in the week, was having numbness in R 1st three fingers.  Very tender in 2 spots, anteriorly (AC) and posteriorly at her neck/shoulder.  Has had similar problems with left arm in the past, last year. She had been given cortisone shots, and physical therapy.  It eventually went away, although it still throbs, but not as severe. She states it "moved to the right arm".    Past Medical History  Diagnosis Date  . Diabetes mellitus   . Hypertension   . Hyperlipidemia   . Allergy    Past Surgical History  Procedure Date  . Breast reduction surgery   . Exploratory laparotomy w/ pull-through 1980    due to bloody aspirate  . Abdominal hysterectomy 1990    and BSO; endometriosis   Current Outpatient Prescriptions on File Prior to Visit  Medication Sig Dispense Refill  . amLODipine (NORVASC) 10 MG tablet Take 1 tablet (10 mg total) by mouth daily.  30 tablet  5  . Beclomethasone Dipropionate (QNASL) 80 MCG/ACT AERS Place 1 spray into the nose 2 (two) times daily.  8.7 g  0  . glipiZIDE (GLUCOTROL) 10 MG tablet Take 1 tablet (10 mg total) by mouth daily.  90 tablet  3  . lisinopril-hydrochlorothiazide (PRINZIDE,ZESTORETIC) 20-25 MG per tablet Take 1 tablet by mouth daily.  90 tablet  3  . metFORMIN (GLUCOPHAGE) 850 MG tablet Take 1 tablet (850 mg total) by mouth 2 (two) times daily with a meal.  60 tablet  11  . pravastatin (PRAVACHOL) 80 MG tablet Take 1 tablet (80 mg total) by mouth every evening.  30 tablet  3  . Naproxen Sodium (NAPRELAN) 750 MG TB24 Take 1 tablet (750 mg total) by mouth daily.  60 each  4  . DISCONTD: cetirizine (ZYRTEC) 10 MG tablet Take 1  tablet (10 mg total) by mouth daily.  90 tablet  3   Allergies  Allergen Reactions  . Codeine Nausea Only   Reports she also can't tolerate hydrocodone due to stomach upset, nausea.  ROS:  Denies fevers, URI symptoms, chest pain.  Currently denies numbness, tingling, weakness, just severe pain.  Denies neck pain, nausea, vomiting, abdominal pain, bleeding/bruising or other concerns  PHYSICAL EXAM: BP 130/84  Pulse 72  Ht 5\' 4"  (1.626 m)  Wt 162 lb (73.483 kg)  BMI 27.81 kg/m2 Well developed female, in moderate distress, unable to support or move her RUE, using L arm to support/move it. Very limited range of motion--abduction only about 15 degrees.  Also very limited forward flexion, and internal/external rotation Spine is nontender. Very tender at R trapezius--significant spasm. Also tender at R [facility] joint Normal strength/sensation Skin: no rashes  Pt is using L arm to move her R  ASSESSMENT/PLAN:  1. Shoulder pain  traMADol (ULTRAM) 50 MG tablet  2. Muscle spasm  cyclobenzaprine (FLEXERIL) 10 MG tablet    Risks/side effects of medications reviewed in detail.  Use flexeril qHS, can try 1/2 tablet during day, up to full tablet if not too sedating.  If too sedating even at 1/2 tab, can call for less sedating muscle relaxant to  take during the day.  Discussed heat/cold, stretches shown. Continue the naprelan--may call for rx next week if effective.  Call next week for referral to PT if ongoing pain

## 2012-04-13 ENCOUNTER — Telehealth: Payer: Self-pay | Admitting: Internal Medicine

## 2012-04-13 MED ORDER — PRAVASTATIN SODIUM 80 MG PO TABS: 80.0000 mg | ORAL_TABLET | Freq: Every evening | ORAL | Status: DC

## 2012-04-13 NOTE — Telephone Encounter (Signed)
Med sent in.

## 2012-04-30 ENCOUNTER — Encounter: Payer: Self-pay | Admitting: Gastroenterology

## 2012-05-25 ENCOUNTER — Encounter: Payer: Self-pay | Admitting: Internal Medicine

## 2012-05-25 DIAGNOSIS — Z9289 Personal history of other medical treatment: Secondary | ICD-10-CM

## 2012-05-25 HISTORY — DX: Personal history of other medical treatment: Z92.89

## 2012-05-25 LAB — HM MAMMOGRAPHY: HM Mammogram: NEGATIVE

## 2012-06-18 ENCOUNTER — Telehealth: Payer: Self-pay | Admitting: Internal Medicine

## 2012-06-18 MED ORDER — AMLODIPINE BESYLATE 10 MG PO TABS: 10.0000 mg | ORAL_TABLET | Freq: Every day | ORAL | Status: DC

## 2012-06-18 NOTE — Telephone Encounter (Signed)
I SENT THE RX REFILL TO THE PHARMACY FOR THE PATIENTS NORVASC. CLS

## 2012-07-24 ENCOUNTER — Encounter: Payer: Self-pay | Admitting: Medical

## 2012-07-24 ENCOUNTER — Ambulatory Visit (INDEPENDENT_AMBULATORY_CARE_PROVIDER_SITE_OTHER): Payer: 59 | Admitting: Medical

## 2012-07-24 VITALS — BP 140/80 | HR 80 | Temp 98.1°F | Resp 16 | Wt 167.0 lb

## 2012-07-24 DIAGNOSIS — D234 Other benign neoplasm of skin of scalp and neck: Secondary | ICD-10-CM

## 2012-07-24 MED ORDER — AMOXICILLIN-POT CLAVULANATE 875-125 MG PO TABS: 1.0000 | ORAL_TABLET | Freq: Two times a day (BID) | ORAL | Status: AC

## 2012-07-24 NOTE — Progress Notes (Signed)
Subjective: Here for knot on right scalp.  Been there for 22mo but getting bigger to the point of tenderness. No warmth, redness or drainage.  No prior similar.  No other aggravating or relieving factors.  No other c/o  Objective: Gen: wd, wn, nad Scalp: right scalp with raised cystic 1.5 cm diameter mobile lesion c/w sebaceous cyst. No induration or fluctuance.    Assessment: Encounter Diagnosis  Name Primary?  . Dermoid cyst of scalp Yes    Plan: She is involved in camp and can't do procedure today.  Wants to return on Friday for I&D.   In the meantime will begin Augmentin and warm compresses.  Discussed nature of these cysts.  RTC Friday.

## 2012-07-27 ENCOUNTER — Ambulatory Visit (INDEPENDENT_AMBULATORY_CARE_PROVIDER_SITE_OTHER): Payer: 59 | Admitting: Medical

## 2012-07-27 ENCOUNTER — Encounter: Payer: Self-pay | Admitting: Medical

## 2012-07-27 VITALS — BP 132/80 | HR 84 | Temp 98.5°F | Resp 16

## 2012-07-27 DIAGNOSIS — L723 Sebaceous cyst: Secondary | ICD-10-CM

## 2012-07-27 DIAGNOSIS — L720 Epidermal cyst: Secondary | ICD-10-CM

## 2012-07-27 NOTE — Patient Instructions (Signed)
I removed a sebaceous cyst from your right parietal scalp today.  The sutures will need to be removed in 1 week.  You will need to be seen to have these removed.   There are 2 simple interrupted blue Prolene sutures along the right parietal scalp.    Keep the area clean and dry for 24 hours.  Then after 24 hours, you can shower, but don't let water just pour on the area.  Use soap and water to keep the area clean.  Epidermal Cyst An epidermal cyst is sometimes called a sebaceous cyst, epidermal inclusion cyst, or infundibular cyst. These cysts usually contain a substance that looks "pasty" or "cheesy" and may have a bad smell. This substance is a protein called keratin. Epidermal cysts are usually found on the face, neck, or trunk. They may also occur in the vaginal area or other parts of the genitalia of both men and women. Epidermal cysts are usually small, painless, slow-growing bumps or lumps that move freely under the skin. It is important not to try to pop them. This may cause an infection and lead to tenderness and swelling. CAUSES  Epidermal cysts may be caused by a deep penetrating injury to the skin or a plugged hair follicle, often associated with acne. SYMPTOMS  Epidermal cysts can become inflamed and cause:  Redness.   Tenderness.   Increased temperature of the skin over the bumps or lumps.   Grayish-white, bad smelling material that drains from the bump or lump.  DIAGNOSIS  Epidermal cysts are easily diagnosed by your caregiver during an exam. Rarely, a tissue sample (biopsy) may be taken to rule out other conditions that may resemble epidermal cysts. TREATMENT   Epidermal cysts often get better and disappear on their own. They are rarely ever cancerous.   If a cyst becomes infected, it may become inflamed and tender. This may require opening and draining the cyst. Treatment with antibiotics may be necessary. When the infection is gone, the cyst may be removed with minor  surgery.   Small, inflamed cysts can often be treated with antibiotics or by injecting steroid medicines.   Sometimes, epidermal cysts become large and bothersome. If this happens, surgical removal in your caregiver's office may be necessary.  HOME CARE INSTRUCTIONS  Only take over-the-counter or prescription medicines as directed by your caregiver.   Take your antibiotics as directed. Finish them even if you start to feel better.  SEEK MEDICAL CARE IF:   Your cyst becomes tender, red, or swollen.   Your condition is not improving or is getting worse.   You have any other questions or concerns.  MAKE SURE YOU:  Understand these instructions.   Will watch your condition.   Will get help right away if you are not doing well or get worse.  Document Released: 11/12/2004 Document Revised: 12/01/2011 Document Reviewed: 06/20/2011 [facility] Patient Information 2012 [place], [place].

## 2012-07-27 NOTE — Progress Notes (Signed)
Subjective: Here for removal of sebaceous cyst.   Cyst has been there about 6 mo on scalp, but has grown to the point that it is tender and has pressure behind it.  No other c/o.  No fever, nausea.     Objective: Gen: wd, wn, nad Skin: right scalp parietal region, somewhat anterior within hairline with 1.5 cm raised round lesion with central pore c/w sebaceous cyst.  No induration, fluctuance, or drainage.  Assessment: Encounter Diagnosis  Name Primary?  . Epidermoid cyst of skin Yes   Plan: discussed risks/benefits of procedure, gave consent for excision/I&D of cyst.  prepped skin in usual sterile fashion, used 2.5 cc of 1% lidocaine with epinephrine for local anesthesia, used #15 blade to make an elliptical incision, used scissors and forceps to tease away cyst sac.  With pressure some cheesy material was expressed but entire sac was removed.  Irrigated area with approx 30 cc of saline, used 5.0 Prolene to close with 2 interrupted simple sutures.  Cleaned skin off with saline, applied small amount of antibiotic ointment, and pt tolerated procedure well.  Less then 3 cc blood loss.  Pt tolerated procedure well.  discussed wound care, and advised she f/u in 1 wk for suture removal.  Advised she can stop the antibiotic.

## 2012-08-03 ENCOUNTER — Ambulatory Visit (INDEPENDENT_AMBULATORY_CARE_PROVIDER_SITE_OTHER): Payer: 59 | Admitting: Medical

## 2012-08-03 ENCOUNTER — Encounter: Payer: Self-pay | Admitting: Medical

## 2012-08-03 DIAGNOSIS — Z4802 Encounter for removal of sutures: Secondary | ICD-10-CM

## 2012-08-03 NOTE — Progress Notes (Signed)
Subjective: Here for suture removal.  I saw her 07/27/12 for sebaceous cyst removal of right parietal scalp.  She has since done well, no particular c/o.  No other c/o.  No fever, nausea, vomiting, drainage.  Objective: Gen: wd, wn Skin: right parietal scalp with 2 simple interrupted purple Prolene sutures present. No erythema, induration, fluctuance  Assessment: Encounter Diagnosis  Name Primary?  . Visit for suture removal Yes  ' Plan: Cleaned and prepped scalp, removed 2 single simple interrupted Prolene sutures.  Pt tolerated procedure well.

## 2012-10-02 ENCOUNTER — Encounter: Payer: Self-pay | Admitting: Medical

## 2012-10-02 ENCOUNTER — Ambulatory Visit (INDEPENDENT_AMBULATORY_CARE_PROVIDER_SITE_OTHER): Payer: 59 | Admitting: Medical

## 2012-10-02 VITALS — BP 140/82 | HR 80 | Temp 98.1°F | Resp 16 | Wt 169.0 lb

## 2012-10-02 DIAGNOSIS — J329 Chronic sinusitis, unspecified: Secondary | ICD-10-CM

## 2012-10-02 DIAGNOSIS — R51 Headache: Secondary | ICD-10-CM

## 2012-10-02 DIAGNOSIS — J309 Allergic rhinitis, unspecified: Secondary | ICD-10-CM

## 2012-10-02 NOTE — Progress Notes (Signed)
Subjective:  Bethany Webb is a 51 y.o. female who presents for week hx/o sinus pressure, bad headache, congestion in head and ears, sneezing, runny nose, some sore throat, some nausea, fatigue similar to prior sinus infections.  Has been using allergy pill some and qnasal some for allergies, but not daily.  Denies sick contacts.  No other aggravating or relieving factors.  No other c/o.   Objective:   Filed Vitals:   10/02/12 1105  BP: 140/82  Pulse: 80  Temp: 98.1 F (36.7 C)  Resp: 16    General appearance: Alert, WD/WN, no distress, congested sounding, ill appearing                             Skin: warm, no rash                           Head: + frontal sinus tenderness,                            Eyes: conjunctiva normal, corneas clear, PERRLA                            Ears: pearly TMs, external ear canals normal                          Nose: septum midline, turbinates swollen, with erythema and clear discharge             Mouth/throat: MMM, tongue normal, mild pharyngeal erythema                           Neck: supple, no adenopathy, no thyromegaly, nontender                          Heart: RRR, normal S1, S2, no murmurs                         Lungs: CTA bilaterally, no wheezes, rales, or rhonchi      Assessment and Plan:   Encounter Diagnoses  Name Primary?  . Sinusitis Yes  . Headache   . Allergic rhinitis    Begin Augmentin (from prior script a month ago she didn't end up using) BID x 10 days.  Mucinex DM OTC.   Discussed symptomatic relief, nasal saline, and call or return if worse or not improving in 2-3 days.     Headache - OTC Tylenol or Ibuprofen for fever and malaise.   Allergic rhinitis - c/t OTC Zyrtec and nasal spray Qnasal

## 2012-10-10 ENCOUNTER — Telehealth: Payer: Self-pay | Admitting: Medical

## 2012-10-10 MED ORDER — PRAVASTATIN SODIUM 80 MG PO TABS: 80.0000 mg | ORAL_TABLET | Freq: Every evening | ORAL | Status: DC

## 2012-10-10 MED ORDER — GLIPIZIDE 10 MG PO TABS: 10.0000 mg | ORAL_TABLET | Freq: Every day | ORAL | Status: DC

## 2012-10-10 MED ORDER — LISINOPRIL-HYDROCHLOROTHIAZIDE 20-25 MG PO TABS: 1.0000 | ORAL_TABLET | Freq: Every day | ORAL | Status: DC

## 2012-10-10 NOTE — Telephone Encounter (Signed)
PATIENT'S RX REFILLS WAS SENT TO THE PHARMACY. CLS

## 2012-10-11 ENCOUNTER — Telehealth: Payer: Self-pay | Admitting: Internal Medicine

## 2012-10-11 MED ORDER — PRAVASTATIN SODIUM 80 MG PO TABS: 80.0000 mg | ORAL_TABLET | Freq: Every evening | ORAL | Status: DC

## 2012-10-11 NOTE — Telephone Encounter (Signed)
Done

## 2012-10-11 NOTE — Telephone Encounter (Signed)
Refill request for a 90 day supply for pravastatin 80mg 

## 2012-11-28 ENCOUNTER — Telehealth: Payer: Self-pay | Admitting: Internal Medicine

## 2012-11-28 NOTE — Telephone Encounter (Signed)
Bethany Webb I think pt was taken off this please advise

## 2012-11-29 ENCOUNTER — Other Ambulatory Visit: Payer: Self-pay | Admitting: Family Medicine

## 2012-11-29 MED ORDER — METFORMIN HCL 850 MG PO TABS: 850.0000 mg | ORAL_TABLET | Freq: Two times a day (BID) | ORAL | Status: DC

## 2012-11-29 NOTE — Telephone Encounter (Signed)
Patient is still on Metformin and has never stop the medication so I sent her refills into the pharmacy.CLS

## 2012-11-29 NOTE — Telephone Encounter (Signed)
Call and see if she is taking metformin at all?  We received refill request, and she was having issues with metformin?

## 2012-12-05 ENCOUNTER — Other Ambulatory Visit: Payer: Self-pay | Admitting: Family Medicine

## 2012-12-05 ENCOUNTER — Telehealth: Payer: Self-pay | Admitting: Internal Medicine

## 2012-12-05 MED ORDER — GLIPIZIDE 10 MG PO TABS: 10.0000 mg | ORAL_TABLET | Freq: Every day | ORAL | Status: DC

## 2012-12-05 MED ORDER — LISINOPRIL-HYDROCHLOROTHIAZIDE 20-25 MG PO TABS: 1.0000 | ORAL_TABLET | Freq: Every day | ORAL | Status: DC

## 2012-12-05 NOTE — Telephone Encounter (Signed)
I sent the patients Rx refills to the pharmacy. CLS

## 2012-12-28 ENCOUNTER — Ambulatory Visit (INDEPENDENT_AMBULATORY_CARE_PROVIDER_SITE_OTHER): Payer: 59 | Admitting: Medical

## 2012-12-28 ENCOUNTER — Encounter: Payer: Self-pay | Admitting: Medical

## 2012-12-28 VITALS — BP 130/80 | HR 68 | Temp 98.4°F | Resp 16 | Wt 174.0 lb

## 2012-12-28 DIAGNOSIS — M25519 Pain in unspecified shoulder: Secondary | ICD-10-CM

## 2012-12-28 DIAGNOSIS — H9201 Otalgia, right ear: Secondary | ICD-10-CM

## 2012-12-28 DIAGNOSIS — H6690 Otitis media, unspecified, unspecified ear: Secondary | ICD-10-CM

## 2012-12-28 DIAGNOSIS — H9209 Otalgia, unspecified ear: Secondary | ICD-10-CM

## 2012-12-28 MED ORDER — TRAMADOL HCL 50 MG PO TABS: 50.0000 mg | ORAL_TABLET | Freq: Four times a day (QID) | ORAL | Status: AC | PRN

## 2012-12-28 MED ORDER — AMOXICILLIN 875 MG PO TABS: 875.0000 mg | ORAL_TABLET | Freq: Two times a day (BID) | ORAL | Status: AC

## 2012-12-28 NOTE — Progress Notes (Signed)
Subjective:  Bethany Webb is a 52 y.o. female who presents with right ear pain x 4-5 days.  Has had low grade fever, tender to touch along right ear externally and neck, feels mildly swollen but no ear drainage.  Denies cough, nasal congestion, sinus pressure, sore throat, nausea, dizziness.  Treatment to date: zyrtec.  Denies sick contacts.  No other aggravating or relieving factors.  No other c/o.  Past Medical History  Diagnosis Date  . Diabetes mellitus   . Hypertension   . Hyperlipidemia   . Allergy    ROS as in subjective   Objective:   Filed Vitals:   12/28/12 1501  BP: 130/80  Pulse: 68  Temp: 98.4 F (36.9 C)  Resp: 16    General appearance: Alert, WD/WN, no distress, ill appearing                             Skin: warm, no rash, no erythema, no induration                           Head: no sinus tenderness                            Eyes: conjunctiva normal, corneas clear, PERRLA                            Ears: left TM pearly, right external ear mildly tender, tender speculum exam, TM mild erythema and decreased light reflex, canal seems swollen mildly, but no discharge/drainage                          Nose: septum midline, turbinates normal, no discharge             Mouth/throat: MMM, tongue normal, pharynx normal                           Neck: supple, no adenopathy, no thyromegaly, nontender                               Assessment and Plan:   Otitis media, otalgia  Prescription for Amoxicillin given as below.  Ultram script for pain.  discussed signs of worsening infection that would prompt immediate return.  Call/return in 2-3 days if symptoms aren't resolving.

## 2013-01-03 ENCOUNTER — Telehealth: Payer: Self-pay | Admitting: Internal Medicine

## 2013-01-04 ENCOUNTER — Other Ambulatory Visit: Payer: Self-pay | Admitting: Family Medicine

## 2013-01-04 MED ORDER — PRAVASTATIN SODIUM 80 MG PO TABS: 80.0000 mg | ORAL_TABLET | Freq: Every evening | ORAL | Status: DC

## 2013-01-04 NOTE — Telephone Encounter (Signed)
I sent medication refills into the pharmacy. CLS

## 2013-01-30 ENCOUNTER — Telehealth: Payer: Self-pay | Admitting: Internal Medicine

## 2013-01-31 ENCOUNTER — Other Ambulatory Visit: Payer: Self-pay

## 2013-01-31 MED ORDER — AMLODIPINE BESYLATE 10 MG PO TABS: 10.0000 mg | ORAL_TABLET | Freq: Every day | ORAL | Status: DC

## 2013-01-31 NOTE — Telephone Encounter (Signed)
done

## 2013-01-31 NOTE — Telephone Encounter (Signed)
Amlodipine sent in

## 2013-02-23 DIAGNOSIS — D709 Neutropenia, unspecified: Secondary | ICD-10-CM

## 2013-02-23 HISTORY — DX: Neutropenia, unspecified: D70.9

## 2013-02-28 ENCOUNTER — Telehealth: Payer: Self-pay | Admitting: Internal Medicine

## 2013-02-28 DIAGNOSIS — I10 Essential (primary) hypertension: Secondary | ICD-10-CM

## 2013-02-28 MED ORDER — AMLODIPINE BESYLATE 10 MG PO TABS: 10.0000 mg | ORAL_TABLET | Freq: Every day | ORAL | Status: DC

## 2013-02-28 NOTE — Telephone Encounter (Signed)
Done

## 2013-03-11 ENCOUNTER — Encounter: Payer: Self-pay | Admitting: Medical

## 2013-03-11 ENCOUNTER — Telehealth: Payer: Self-pay | Admitting: Internal Medicine

## 2013-03-11 ENCOUNTER — Ambulatory Visit: Payer: 59 | Admitting: Medical

## 2013-03-11 VITALS — BP 138/80 | HR 68 | Temp 97.5°F | Resp 16 | Wt 173.0 lb

## 2013-03-11 DIAGNOSIS — M25519 Pain in unspecified shoulder: Secondary | ICD-10-CM

## 2013-03-11 DIAGNOSIS — M25512 Pain in left shoulder: Secondary | ICD-10-CM

## 2013-03-11 DIAGNOSIS — M25511 Pain in right shoulder: Secondary | ICD-10-CM

## 2013-03-11 DIAGNOSIS — M6281 Muscle weakness (generalized): Secondary | ICD-10-CM

## 2013-03-11 DIAGNOSIS — M542 Cervicalgia: Secondary | ICD-10-CM

## 2013-03-11 LAB — SEDIMENTATION RATE: Sed Rate: 6 mm/hr (ref 0–22)

## 2013-03-11 MED ORDER — NAPROXEN 375 MG PO TABS: 375.0000 mg | ORAL_TABLET | Freq: Two times a day (BID) | ORAL | Status: DC

## 2013-03-11 NOTE — Progress Notes (Signed)
Subjective: Here for pains in neck, shoulders and arms.  Has been seen for this prior in 2013.  Has throbbing pains in neck, shoulder and upper back.  Has pains in both shoulders, worse right>L.  But at times left can be as bad.  Denies hip pains or weakness.  Has been getting pains constant for about a week, but been aggravating her for 2 weeks or more this time.  It is a throbbing pain, pains shoots down to elbow.  Feels weakness with hands and shoulder.  At times can get tingling or numbness in all fingers, depending on which side is aggravating her the most.  The pain seems to start in the upper back, migrates to neck.  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  Has has a few headaches here or there, but not a major compliant.  Using Aleve with some improvement.   Gets massage from time to time.     Past Medical History  Diagnosis Date  . Diabetes mellitus   . Hypertension   . Hyperlipidemia   . Allergy    ROS as in subjective   Objective: Filed Vitals:   03/11/13 1306  BP: 138/80  Pulse: 68  Temp: 97.5 F (36.4 C)  Resp: 16    General appearance: alert, no distress, WD/WN Oral cavity: MMM, no lesions Neck: supple, no lymphadenopathy, no thyromegaly, no masses, mild bilat and posterior tenderness, mild pain with neck ROM, but ROM seems full MSK: tender bilat deltoids, upper arms, pain with passive and active shoulder ROM above 80 degrees with flexion and abduction, pain with empty can and resisted shoulder strength testing in general, otherwise nontender, ROM of UE seems full otherwise.  Negative special tests of the shoulder otherwise. Back: upper back paraspinal muscles tender, nontender midline Neuro: bilat arms, hands, fingers normal strength, sensation and DTRs Pulses: 2+ symmetric, upper extremities, normal cap  refill  Assessment: Encounter Diagnoses  Name Primary?  . Neck pain Yes  . Shoulder pain, bilateral   . Muscle weakness    Plan: Discussed possible differential - arthritis, PMR, myositis, fibromyalgia, cervical disc disease, other.   Reviewed C spine xray from 2011 showing mild spondylosis.  Sed rate lab today.  Script for Naprosyn today, advised daily stretching routine as demonstrated, occasional massage therapy, heat, and she has some left over Flexeril prn for QHS use.  She was seen by Dr. Adria Dill [facility] in 01/2011, MRI shoulder at that time recommended, not sure if this was done.  There was concern for evaluating for rotator cuff vs mass of scapula?  Follow-up pending lab.  I reviewed prior ortho notes, we will call to see what other imaging was done through ortho that I dont' have records of.

## 2013-03-11 NOTE — Telephone Encounter (Signed)
90 day request for naproxen 375mg  #180 to [facility]

## 2013-03-12 ENCOUNTER — Other Ambulatory Visit: Payer: Self-pay | Admitting: Medical

## 2013-03-20 ENCOUNTER — Encounter: Payer: Self-pay | Admitting: Family Medicine

## 2013-03-22 ENCOUNTER — Encounter: Payer: Self-pay | Admitting: Medical

## 2013-03-22 ENCOUNTER — Ambulatory Visit (INDEPENDENT_AMBULATORY_CARE_PROVIDER_SITE_OTHER): Payer: 59 | Admitting: Medical

## 2013-03-22 VITALS — BP 130/80 | HR 60 | Temp 98.5°F | Resp 16 | Wt 174.0 lb

## 2013-03-22 DIAGNOSIS — M25512 Pain in left shoulder: Secondary | ICD-10-CM

## 2013-03-22 DIAGNOSIS — M25511 Pain in right shoulder: Secondary | ICD-10-CM

## 2013-03-22 DIAGNOSIS — M25519 Pain in unspecified shoulder: Secondary | ICD-10-CM

## 2013-03-22 DIAGNOSIS — M542 Cervicalgia: Secondary | ICD-10-CM

## 2013-03-22 LAB — CBC WITH DIFFERENTIAL/PLATELET
Hemoglobin: 12 g/dL (ref 12.0–15.0)
Lymphocytes Relative: 42 % (ref 12–46)
Lymphs Abs: 1.6 10*3/uL (ref 0.7–4.0)
Monocytes Relative: 11 % (ref 3–12)
Neutro Abs: 1.6 10*3/uL — ABNORMAL LOW (ref 1.7–7.7)
Neutrophils Relative %: 43 % (ref 43–77)
Platelets: 330 10*3/uL (ref 150–400)
RBC: 4.32 MIL/uL (ref 3.87–5.11)
WBC: 3.7 10*3/uL — ABNORMAL LOW (ref 4.0–10.5)

## 2013-03-22 NOTE — Progress Notes (Signed)
Subjective: Here for recheck on pains in neck, shoulders and both arms. Has throbbing pains in neck, shoulder and upper back.  Has pains in both shoulders, worse right>L.  But at times left can be as bad.  Denies hip pains or weakness.  Has been getting pains constant for about 2 weeks.  It is a throbbing pain, pains shoots down to elbow.  Feels weakness with hands and shoulder.  At times can get tingling or numbness in all fingers, depending on which side is aggravating her the most.  The pain seems to start in the upper back, migrates to neck.  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  Has has a few headaches here or there, but not a major compliant.  Using Aleve with some improvement.   Gets massage from time to time which helps the pain.   Past Medical History  Diagnosis Date  . Diabetes mellitus   . Hypertension   . Hyperlipidemia   . Allergy    ROS as in subjective   Objective: Filed Vitals:   03/22/13 0808  BP: 130/80  Pulse: 60  Temp: 98.5 F (36.9 C)  Resp: 16    General appearance: alert, no distress, WD/WN Oral cavity: MMM, no lesions Neck: supple, no lymphadenopathy, no thyromegaly, no masses, mild bilat and posterior tenderness, mild pain with neck ROM, but ROM seems full MSK: tender bilat deltoids, upper arms, pain with passive and active shoulder ROM above 80 degrees with flexion and abduction, pain with empty can and resisted shoulder strength testing in general, otherwise nontender, ROM of UE seems full otherwise.  Negative special tests of the shoulder otherwise. Back: upper back paraspinal muscles tender, nontender midline Neuro: bilat arms, hands, fingers normal strength, sensation and DTRs Pulses: 2+ symmetric, upper extremities, normal cap refill  Assessment: Encounter Diagnoses  Name Primary?  . Cervicalgia Yes   . Shoulder pain, bilateral     Plan: Discussed possible differential - arthritis, PMR, myositis, fibromyalgia, cervical disc disease, other.   Reviewed C spine xray from 2011 showing mild spondylosis.  Recent sed rate normal.  CBC and CRP today for completeness.  C/t Naprosyn, advised daily stretching routine as demonstrated, occasional massage therapy, heat, and she has some left over Flexeril prn for QHS use.  She was seen by Dr. Adria Dill [facility] in 01/2011, MRI left shoulder showed mild supraspinatus and infraspinatus tendinosis.  At this point if labs +, will add prednisone.   If not, consider routine exercise and stretching, massage therapy, and consider neck imaging to help eval for radicular pains.  She declined my recent physical therapy referral.

## 2013-03-23 ENCOUNTER — Encounter: Payer: Self-pay | Admitting: Medical

## 2013-03-23 ENCOUNTER — Other Ambulatory Visit: Payer: Self-pay | Admitting: Medical

## 2013-03-23 DIAGNOSIS — M542 Cervicalgia: Secondary | ICD-10-CM

## 2013-03-23 DIAGNOSIS — M25511 Pain in right shoulder: Secondary | ICD-10-CM

## 2013-03-23 DIAGNOSIS — M79601 Pain in right arm: Secondary | ICD-10-CM

## 2013-03-23 LAB — C-REACTIVE PROTEIN: CRP: <0.5 mg/dL (ref <0.60)

## 2013-03-26 ENCOUNTER — Other Ambulatory Visit: Payer: Self-pay | Admitting: Medical

## 2013-03-26 DIAGNOSIS — IMO0001 Reserved for inherently not codable concepts without codable children: Secondary | ICD-10-CM

## 2013-03-26 DIAGNOSIS — M25511 Pain in right shoulder: Secondary | ICD-10-CM

## 2013-03-26 DIAGNOSIS — M542 Cervicalgia: Secondary | ICD-10-CM

## 2013-03-26 DIAGNOSIS — M79603 Pain in arm, unspecified: Secondary | ICD-10-CM

## 2013-03-29 ENCOUNTER — Ambulatory Visit
Admission: RE | Admit: 2013-03-29 | Discharge: 2013-03-29 | Disposition: A | Discharge status: Home / Self Care | Payer: 59 | Source: Ambulatory Visit | Attending: Medical | Admitting: Medical

## 2013-03-29 DIAGNOSIS — M25511 Pain in right shoulder: Secondary | ICD-10-CM

## 2013-03-29 DIAGNOSIS — M542 Cervicalgia: Secondary | ICD-10-CM

## 2013-03-29 DIAGNOSIS — M79601 Pain in right arm: Secondary | ICD-10-CM

## 2013-03-29 DIAGNOSIS — R202 Paresthesia of skin: Secondary | ICD-10-CM

## 2013-04-02 ENCOUNTER — Other Ambulatory Visit: Payer: 59

## 2013-04-05 ENCOUNTER — Encounter: Payer: Self-pay | Admitting: Medical

## 2013-04-05 ENCOUNTER — Encounter: Payer: 59 | Admitting: Medical

## 2013-04-05 ENCOUNTER — Ambulatory Visit (INDEPENDENT_AMBULATORY_CARE_PROVIDER_SITE_OTHER): Payer: 59 | Admitting: Medical

## 2013-04-05 VITALS — BP 130/80 | HR 84 | Temp 98.4°F | Resp 16 | Ht 65.0 in | Wt 173.0 lb

## 2013-04-05 DIAGNOSIS — M25519 Pain in unspecified shoulder: Secondary | ICD-10-CM

## 2013-04-05 DIAGNOSIS — M25512 Pain in left shoulder: Secondary | ICD-10-CM

## 2013-04-05 DIAGNOSIS — M25511 Pain in right shoulder: Secondary | ICD-10-CM

## 2013-04-05 DIAGNOSIS — Z0000 Encounter for general adult medical examination without abnormal findings: Secondary | ICD-10-CM

## 2013-04-05 DIAGNOSIS — T50905S Adverse effect of unspecified drugs, medicaments and biological substances, sequela: Secondary | ICD-10-CM

## 2013-04-05 DIAGNOSIS — Z1239 Encounter for other screening for malignant neoplasm of breast: Secondary | ICD-10-CM

## 2013-04-05 DIAGNOSIS — E785 Hyperlipidemia, unspecified: Secondary | ICD-10-CM

## 2013-04-05 DIAGNOSIS — I10 Essential (primary) hypertension: Secondary | ICD-10-CM

## 2013-04-05 DIAGNOSIS — M542 Cervicalgia: Secondary | ICD-10-CM

## 2013-04-05 DIAGNOSIS — E119 Type 2 diabetes mellitus without complications: Secondary | ICD-10-CM

## 2013-04-05 DIAGNOSIS — Z90710 Acquired absence of both cervix and uterus: Secondary | ICD-10-CM

## 2013-04-05 LAB — LIPID PANEL
HDL: 46 mg/dL (ref >39)
LDL Cholesterol: 101 mg/dL — ABNORMAL HIGH (ref 0–99)
Total CHOL/HDL Ratio: 3.5 Ratio

## 2013-04-05 LAB — COMPREHENSIVE METABOLIC PANEL
ALT: 11 U/L (ref 0–35)
CO2: 32 mEq/L (ref 19–32)
Sodium: 140 mEq/L (ref 135–145)
Total Bilirubin: 0.9 mg/dL (ref 0.3–1.2)
Total Protein: 7 g/dL (ref 6.0–8.3)

## 2013-04-05 LAB — POCT URINALYSIS DIPSTICK
Bilirubin, UA: NEGATIVE
Leukocytes, UA: NEGATIVE
Nitrite, UA: NEGATIVE
Protein, UA: NEGATIVE
Urobilinogen, UA: NEGATIVE
pH, UA: 7

## 2013-04-05 LAB — HEMOGLOBIN A1C: Hgb A1c MFr Bld: 7.2 % — ABNORMAL HIGH (ref <5.7)

## 2013-04-05 NOTE — Progress Notes (Addendum)
Subjective:   HPI  Bethany Webb is a 52 y.o. female who presents for a complete physical.   Preventative care: Last ophthalmology visit: 07/2012- Dr. Shea Evans Last dental visit: yes- [facility]. [facility] Last colonoscopy:02/2012 Last mammogram:04/2012 Last gynecological exam: ? Last EKG:08/2009 Last labs:02/2013  Prior vaccinations: TD or Tdap:03/2006 Influenza:never Pneumococcal:n/a Shingles/Zostavax:n/a  Advanced directive:n/a Health care power of attorney:n/a Living will:n/a  Concerns: ongoing neck and shoulder pains, and we referred her to neurosurgery for consult.    Past Medical History  Diagnosis Date  . Hypertension   . Hyperlipidemia   . Allergy   . Supraspinatus tendonitis 2/12    Dr. Darrelyn Hillock  . Biceps tendonitis 2/12    Dr. Darrelyn Hillock  . History of mammogram 05/25/12    stable, no suspicious finding, extremely dense breast tissue  . S/P hysterectomy   . Neutropenia, unspecified 02/2013    slight, likely ethnicity related  . 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 Diabetes mellitus     type II  . Pneumococcal vaccine refused 03/2013  . Refused influenza vaccine 03/2013    Past Surgical History  Procedure Laterality Date  . Breast reduction surgery    . Exploratory laparotomy w/ pull-through  1980    due to bloody aspirate  . Abdominal hysterectomy  1990    and BSO; endometriosis  . Colonoscopy  03/06/12    sigmoid polyp; repeat 10 years; Dr. Mariana Kaufman    Family History  Problem Relation Age of Onset  . Cancer Mother     breast  . Diabetes Mother   . Heart disease Mother 66  . Heart disease Father 32    died of MI, CABG   . Multiple sclerosis Sister   . Asthma Brother   . Cancer Maternal Uncle     hematologic  . Hypertension Maternal Grandmother   . Cancer Maternal Grandfather     prostate  . Hypertension Maternal Grandfather     History   Social History  .  Marital Status: Single    Spouse Name: N/A    Number of Children: N/A  . Years of Education: N/A   Occupational History  . Not on file.   Social History Main Topics  . Smoking status: Never Smoker   . Smokeless tobacco: Never Used  . Alcohol Use: 1.8 oz/week    3 Glasses of wine per week     Comment: wine 1- 3 times a week  . Drug Use: No  . Sexually Active: Not on file   Other Topics Concern  . 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 amLODipine (NORVASC) 10 MG tablet Take 1 tablet (10 mg total) by mouth daily.  30 tablet  0  . Beclomethasone Dipropionate (QNASL) 80 MCG/ACT AERS Place 1 spray into the nose 2 (two) times daily.  8.7 g  0  . glipiZIDE (GLUCOTROL) 10 MG tablet Take 1 tablet (10 mg total) by mouth daily.  90 tablet  0  . lisinopril-hydrochlorothiazide (PRINZIDE,ZESTORETIC) 20-25 MG per tablet Take 1 tablet by mouth daily.  90 tablet  0  . metFORMIN (GLUCOPHAGE) 850 MG tablet Take 1 tablet (850 mg total) by mouth 2 (two)  times daily with a meal.  60 tablet  11  . naproxen (NAPROSYN) 375 MG tablet Take 1 tablet (375 mg total) by mouth 2 (two) times daily with a meal.  30 tablet  0  . pravastatin (PRAVACHOL) 80 MG tablet Take 1 tablet (80 mg total) by mouth every evening.  90 tablet  0  . [DISCONTINUED] cetirizine (ZYRTEC) 10 MG tablet Take 1 tablet (10 mg total) by mouth daily.  90 tablet  3   No current facility-administered medications on file prior to visit.    Allergies  Allergen Reactions  . Codeine Nausea Only   Reviewed their medical, surgical, family, social, medication, and allergy history and updated chart as appropriate.  Review of Systems Constitutional: -fever, -chills, -sweats, -unexpected weight change, -decreased appetite, -fatigue Allergy:  -sneezing, -itching, -congestion Dermatology: -changing moles, --rash, -lumps ENT: -runny nose, -ear pain, -sore throat, -hoarseness, -sinus pain, -teeth pain, - ringing in ears, -hearing loss, -nosebleeds Cardiology: -chest pain, -palpitations, -swelling, -difficulty breathing when lying flat, -waking up short of breath Respiratory: -cough, -shortness of breath, -difficulty breathing with exercise or exertion, -wheezing, -coughing up blood Gastroenterology: -abdominal pain, -nausea, -vomiting, -diarrhea, -constipation, -blood in stool, -changes in bowel movement, -difficulty swallowing or eating Hematology: -bleeding, -bruising  Musculoskeletal: +joint aches, -muscle aches, -joint swelling, +back pain, +neck pain, -cramping, -changes in gait Ophthalmology: denies vision changes, eye redness, itching, discharge Urology: -burning with urination, -difficulty urinating, -blood in urine, -urinary frequency, -urgency, -incontinence Neurology: -headache, -weakness, +tingling, +numbness, -memory loss, -falls, -dizziness Psychology: -depressed mood, -agitation, -sleep problems     Objective:   Physical Exam  Nurse notes and vitals reviewed  General appearance: alert, no distress, WD/WN, overweight AA female, in pain Skin:few scattered benign appearing lesions on face and torso, callous of ritght lateral distal phalanx of 5th toe HEENT: normocephalic, conjunctiva/corneas normal, sclerae anicteric, PERRLA, EOMi, nares patent, no discharge or erythema, pharynx normal Oral cavity: MMM, tongue normal, teeth upper bridge, otherwise in good repair Neck: supple, no lymphadenopathy, no thyromegaly, no masses, tender posteriorly, decreased extension, otherwise ROM normal, no bruits Chest: non tender, normal shape and expansion Heart: RRR, normal S1, S2, no murmurs Lungs: CTA bilaterally, no wheezes, rhonchi, or rales Abdomen: +bs, soft, non tender, non distended, no masses, no hepatomegaly, no splenomegaly,  no bruits Back: non tender, normal ROM, no scoliosis Musculoskeletal:tender bilat deltoids, upper arms, pain with passive and active shoulder ROM above 80 degrees with flexion and abduction, pain with empty can and resisted shoulder strength testing in general, otherwise nontender, ROM of UE seems full otherwise. Negative special tests of the shoulder otherwise.  Lower extremities non tender, no obvious deformity, normal ROM throughout Extremities: no edema, no cyanosis, no clubbing Pulses: 2+ symmetric, upper and lower extremities, normal cap refill Neurological: monofilament exam normal, alert, oriented x 3, CN2-12 intact, strength normal upper extremities and lower extremities, sensation normal throughout, DTRs 2+ throughout, no cerebellar signs, gait normal Psychiatric: normal affect, behavior normal, pleasant  Breast: nontender, surgical scars inferior breasts and bilat inferior horizontal from prior breast reduction surgery, no masses or lumps, no skin changes, no nipple discharge or inversion, no axillary lymphadenopathy Gyn: Normal external genitalia without lesions, vagina with normal mucosa, cervix without lesions, no cervical motion tenderness, no abnormal vaginal discharge.  Uterus and adnexa not enlarged, nontender, no masses.  Pap performed.  Exam chaperoned by nurse. Rectal: deferred, just had colonoscopy 2013    Assessment and Plan :    Encounter Diagnoses  Name Primary?  Marland Kitchen  Routine general medical examination at a health care facility Yes  . Type II or unspecified type diabetes mellitus without mention of complication, not stated as uncontrolled   . Hyperlipidemia   . Essential hypertension, benign   . Adverse effects of medication, sequela   . Screening for breast cancer   . S/P hysterectomy   . Cervicalgia   . Shoulder pain, bilateral       Physical exam - discussed healthy lifestyle, diet, exercise, preventative care, vaccinations, and addressed their concerns.   Handout given.  Discussed he concerns for weight loss, interested in medication for weight loss, but is getting ready to start Weight Watchers program.  DM type II - controlled, checking feet daily, yearly eye exams, f/u pending labs  Hyperlipidemia - c/t same medication, labs today  HTN - controlled on current regimen  Adverse effect - nausea with metformin, has tried other medications.  Referral for yearly mammogram given mother's history and dense breast tissue  S/p hysterectomy  Cervicalgia and shoulder pain - reviewed recent MRI C spine, has neurosurgery consult in 2 wk.   Follow-up pending studies

## 2013-04-17 ENCOUNTER — Telehealth: Payer: Self-pay | Admitting: Internal Medicine

## 2013-04-17 DIAGNOSIS — I10 Essential (primary) hypertension: Secondary | ICD-10-CM

## 2013-04-17 MED ORDER — AMLODIPINE BESYLATE 10 MG PO TABS: 10.0000 mg | ORAL_TABLET | Freq: Every day | ORAL | Status: DC

## 2013-04-17 NOTE — Telephone Encounter (Signed)
I sent the Rx refills to her pharmacy. CLS

## 2013-04-17 NOTE — Telephone Encounter (Signed)
Refill request for amlodipine 10mg  to [facility]

## 2013-04-24 ENCOUNTER — Encounter: Payer: Self-pay | Admitting: Medical

## 2013-05-03 ENCOUNTER — Other Ambulatory Visit: Payer: Self-pay | Admitting: Neurosurgery

## 2013-05-03 DIAGNOSIS — M542 Cervicalgia: Secondary | ICD-10-CM

## 2013-05-03 DIAGNOSIS — M5410 Radiculopathy, site unspecified: Secondary | ICD-10-CM

## 2013-05-03 DIAGNOSIS — M479 Spondylosis, unspecified: Secondary | ICD-10-CM

## 2013-05-03 DIAGNOSIS — M4800 Spinal stenosis, site unspecified: Secondary | ICD-10-CM

## 2013-05-10 ENCOUNTER — Ambulatory Visit
Admission: RE | Admit: 2013-05-10 | Discharge: 2013-05-10 | Disposition: A | Discharge status: Home / Self Care | Payer: 59 | Source: Ambulatory Visit | Attending: Neurosurgery | Admitting: Neurosurgery

## 2013-05-10 VITALS — BP 126/65 | HR 81

## 2013-05-10 DIAGNOSIS — M479 Spondylosis, unspecified: Secondary | ICD-10-CM

## 2013-05-10 DIAGNOSIS — M542 Cervicalgia: Secondary | ICD-10-CM

## 2013-05-10 DIAGNOSIS — M4800 Spinal stenosis, site unspecified: Secondary | ICD-10-CM

## 2013-05-10 DIAGNOSIS — M5410 Radiculopathy, site unspecified: Secondary | ICD-10-CM

## 2013-05-10 MED ORDER — DIAZEPAM 5 MG PO TABS
10.0000 mg | ORAL_TABLET | Freq: Once | ORAL | Status: AC
  Administered 2013-05-10: 10 mg via ORAL

## 2013-05-10 MED ORDER — MEPERIDINE HCL 100 MG/ML IJ SOLN
75.0000 mg | Freq: Once | INTRAMUSCULAR | Status: AC
  Administered 2013-05-10: 75 mg via INTRAMUSCULAR

## 2013-05-10 MED ORDER — IOHEXOL 300 MG/ML  SOLN
10.0000 mL | Freq: Once | INTRAMUSCULAR | Status: AC | PRN
  Administered 2013-05-10: 10 mL via INTRATHECAL

## 2013-05-10 MED ORDER — ONDANSETRON HCL 4 MG/2ML IJ SOLN
4.0000 mg | Freq: Once | INTRAMUSCULAR | Status: AC
  Administered 2013-05-10: 4 mg via INTRAMUSCULAR

## 2013-05-10 NOTE — Progress Notes (Signed)
Patient states she has been off Tramadol for at least the past two days.  Discharge instructions explained to patient.  Jeanne Lohr, RN 

## 2013-06-11 ENCOUNTER — Other Ambulatory Visit: Payer: Self-pay | Admitting: Neurosurgery

## 2013-06-12 ENCOUNTER — Telehealth: Payer: Self-pay | Admitting: Internal Medicine

## 2013-06-12 ENCOUNTER — Encounter (HOSPITAL_COMMUNITY): Payer: Self-pay | Admitting: Pharmacy Technician

## 2013-06-12 MED ORDER — GLIPIZIDE 10 MG PO TABS: 10.0000 mg | ORAL_TABLET | Freq: Every day | ORAL | Status: DC

## 2013-06-12 MED ORDER — PRAVASTATIN SODIUM 80 MG PO TABS: 80.0000 mg | ORAL_TABLET | Freq: Every evening | ORAL | Status: DC

## 2013-06-12 MED ORDER — LISINOPRIL-HYDROCHLOROTHIAZIDE 20-25 MG PO TABS: 1.0000 | ORAL_TABLET | Freq: Every day | ORAL | Status: DC

## 2013-06-12 NOTE — Telephone Encounter (Signed)
Refill request for pravastatin 80mg , and lisinopril-hctz 20-25mg  and glipizide 10mg  to [facility]

## 2013-06-12 NOTE — Telephone Encounter (Signed)
REFILL ON MEDICATION SENT. CLS

## 2013-06-17 NOTE — Pre-Procedure Instructions (Addendum)
Bethany Webb  06/17/2013   Your procedure is scheduled on: Thursday, June 26TH   Report to [facility] at 8:30 AM.             Arrival time is per your surgeon's request.             Come through Entrance "A". Follow signs to EAST Elevators and go to 3rd floor.   Call this number if you have problems the morning of surgery: [phone]   Remember:   Do not eat food or drink liquids after midnight Wednesday.   Take these medicines the morning of surgery with A SIP OF WATER: Norvasc, Zyrtec   Do not wear jewelry, make-up or nail polish.  Do not wear lotions, powders, or perfumes. You may NOT wear deodorant.  Do not shave underarms and legs 48 hours prior to surgery.    Do not bring valuables to the hospital.  [facility] is not responsible for any belongings or valuables.  Contacts, dentures or bridgework may not be worn into surgery.   Leave suitcase in the car. After surgery it may be brought to your room.  For patients admitted to the hospital, checkout time is 11:00 AM the day of discharge.   Name and phone number of your driver:    Special Instructions: Shower using CHG 2 nights before surgery and the night before surgery.  If you shower the day of surgery use CHG.  Use special wash - you have one bottle of CHG for all showers.  You should use approximately 1/3 of the bottle for each shower.   Please read over the following fact sheets that you were given: Pain Booklet, MRSA Information and Surgical Site Infection Prevention

## 2013-06-18 ENCOUNTER — Encounter (HOSPITAL_COMMUNITY): Payer: Self-pay

## 2013-06-18 ENCOUNTER — Encounter (HOSPITAL_COMMUNITY)
Admission: RE | Admit: 2013-06-18 | Discharge: 2013-06-18 | Disposition: A | Discharge status: Home / Self Care | Payer: 59 | Source: Ambulatory Visit | Attending: Neurosurgery | Admitting: Neurosurgery

## 2013-06-18 HISTORY — DX: Personal history of other medical treatment: Z92.89

## 2013-06-18 LAB — BASIC METABOLIC PANEL
GFR calc Af Amer: >90 mL/min (ref >90)
GFR calc non Af Amer: 81 mL/min — ABNORMAL LOW (ref >90)
Potassium: 3 mEq/L — ABNORMAL LOW (ref 3.5–5.1)
Sodium: 140 mEq/L (ref 135–145)

## 2013-06-18 LAB — CBC
MCHC: 33.7 g/dL (ref 30.0–36.0)
RDW: 13.4 % (ref 11.5–15.5)

## 2013-06-19 MED ORDER — CEFAZOLIN SODIUM-DEXTROSE 2-3 GM-% IV SOLR
2.0000 g | INTRAVENOUS | Status: AC
  Administered 2013-06-20: 2 g via INTRAVENOUS
  Filled 2013-06-19: qty 50

## 2013-06-20 ENCOUNTER — Ambulatory Visit (HOSPITAL_COMMUNITY): Payer: 59 | Admitting: Anesthesiology

## 2013-06-20 ENCOUNTER — Ambulatory Visit (HOSPITAL_COMMUNITY): Payer: 59

## 2013-06-20 ENCOUNTER — Encounter (HOSPITAL_COMMUNITY)
Admission: RE | Disposition: A | Discharge status: Home / Self Care | Payer: Self-pay | Source: Ambulatory Visit | Attending: Neurosurgery

## 2013-06-20 ENCOUNTER — Observation Stay (HOSPITAL_COMMUNITY)
Admission: RE | Admit: 2013-06-20 | Discharge: 2013-06-21 | Disposition: A | Discharge status: Home / Self Care | Payer: 59 | Source: Ambulatory Visit | Attending: Neurosurgery | Admitting: Neurosurgery

## 2013-06-20 ENCOUNTER — Encounter (HOSPITAL_COMMUNITY): Payer: Self-pay | Admitting: Anesthesiology

## 2013-06-20 ENCOUNTER — Encounter (HOSPITAL_COMMUNITY): Payer: Self-pay | Admitting: Neurology

## 2013-06-20 DIAGNOSIS — M5030 Other cervical disc degeneration, unspecified cervical region: Secondary | ICD-10-CM | POA: Insufficient documentation

## 2013-06-20 DIAGNOSIS — Z01818 Encounter for other preprocedural examination: Secondary | ICD-10-CM | POA: Insufficient documentation

## 2013-06-20 DIAGNOSIS — Z01812 Encounter for preprocedural laboratory examination: Secondary | ICD-10-CM | POA: Insufficient documentation

## 2013-06-20 DIAGNOSIS — G5600 Carpal tunnel syndrome, unspecified upper limb: Secondary | ICD-10-CM | POA: Insufficient documentation

## 2013-06-20 DIAGNOSIS — Z79899 Other long term (current) drug therapy: Secondary | ICD-10-CM | POA: Insufficient documentation

## 2013-06-20 DIAGNOSIS — M47812 Spondylosis without myelopathy or radiculopathy, cervical region: Principal | ICD-10-CM | POA: Insufficient documentation

## 2013-06-20 DIAGNOSIS — Z01810 Encounter for preprocedural cardiovascular examination: Secondary | ICD-10-CM | POA: Insufficient documentation

## 2013-06-20 DIAGNOSIS — M4712 Other spondylosis with myelopathy, cervical region: Secondary | ICD-10-CM

## 2013-06-20 HISTORY — PX: ANTERIOR CERVICAL DECOMP/DISCECTOMY FUSION: SHX1161

## 2013-06-20 HISTORY — PX: CARPAL TUNNEL RELEASE: SHX101

## 2013-06-20 LAB — GLUCOSE, CAPILLARY
Glucose-Capillary: 129 mg/dL — ABNORMAL HIGH (ref 70–99)
Glucose-Capillary: 126 mg/dL — ABNORMAL HIGH (ref 70–99)
Glucose-Capillary: 187 mg/dL — ABNORMAL HIGH (ref 70–99)

## 2013-06-20 SURGERY — ANTERIOR CERVICAL DECOMPRESSION/DISCECTOMY FUSION 1 LEVEL
Anesthesia: General | Site: Wrist | Laterality: Right | Wound class: Clean

## 2013-06-20 MED ORDER — LACTATED RINGERS IV SOLN
INTRAVENOUS | Status: DC
  Administered 2013-06-20 – 2013-06-21 (×2): via INTRAVENOUS

## 2013-06-20 MED ORDER — MORPHINE SULFATE 2 MG/ML IJ SOLN: 1.0000 mg | INTRAMUSCULAR | Status: DC | PRN

## 2013-06-20 MED ORDER — PHENYLEPHRINE HCL 10 MG/ML IJ SOLN
10.0000 mg | INTRAMUSCULAR | Status: DC | PRN
  Administered 2013-06-20: 10 ug/min via INTRAVENOUS

## 2013-06-20 MED ORDER — HYDROCODONE-ACETAMINOPHEN 5-325 MG PO TABS: 1.0000 | ORAL_TABLET | ORAL | Status: DC | PRN

## 2013-06-20 MED ORDER — ACETAMINOPHEN 10 MG/ML IV SOLN: 1000.0000 mg | Freq: Once | INTRAVENOUS | Status: DC | PRN

## 2013-06-20 MED ORDER — BUPIVACAINE-EPINEPHRINE PF 0.5-1:200000 % IJ SOLN
INTRAMUSCULAR | Status: DC | PRN
  Administered 2013-06-20: 3 mL
  Administered 2013-06-20: 10 mL

## 2013-06-20 MED ORDER — HYDROCHLOROTHIAZIDE 25 MG PO TABS
25.0000 mg | ORAL_TABLET | Freq: Every day | ORAL | Status: DC
  Administered 2013-06-20: 25 mg via ORAL
  Filled 2013-06-20 (×2): qty 1

## 2013-06-20 MED ORDER — LACTATED RINGERS IV SOLN
INTRAVENOUS | Status: DC | PRN
  Administered 2013-06-20 (×3): via INTRAVENOUS

## 2013-06-20 MED ORDER — ROCURONIUM BROMIDE 100 MG/10ML IV SOLN
INTRAVENOUS | Status: DC | PRN
  Administered 2013-06-20 (×2): 10 mg via INTRAVENOUS
  Administered 2013-06-20: 40 mg via INTRAVENOUS

## 2013-06-20 MED ORDER — LISINOPRIL-HYDROCHLOROTHIAZIDE 20-25 MG PO TABS: 1.0000 | ORAL_TABLET | Freq: Every day | ORAL | Status: DC

## 2013-06-20 MED ORDER — GLYCOPYRROLATE 0.2 MG/ML IJ SOLN
INTRAMUSCULAR | Status: DC | PRN
  Administered 2013-06-20: .4 mg via INTRAVENOUS

## 2013-06-20 MED ORDER — ACETAMINOPHEN 325 MG PO TABS: 650.0000 mg | ORAL_TABLET | ORAL | Status: DC | PRN

## 2013-06-20 MED ORDER — ONDANSETRON HCL 4 MG/2ML IJ SOLN
4.0000 mg | INTRAMUSCULAR | Status: DC | PRN
  Administered 2013-06-20: 4 mg via INTRAVENOUS
  Filled 2013-06-20: qty 2

## 2013-06-20 MED ORDER — SIMVASTATIN 40 MG PO TABS
40.0000 mg | ORAL_TABLET | Freq: Every day | ORAL | Status: DC
  Administered 2013-06-20: 40 mg via ORAL
  Filled 2013-06-20 (×2): qty 1

## 2013-06-20 MED ORDER — DOCUSATE SODIUM 100 MG PO CAPS
100.0000 mg | ORAL_CAPSULE | Freq: Two times a day (BID) | ORAL | Status: DC
  Filled 2013-06-20: qty 1

## 2013-06-20 MED ORDER — DEXAMETHASONE SODIUM PHOSPHATE 4 MG/ML IJ SOLN: 2.0000 mg | Freq: Four times a day (QID) | INTRAMUSCULAR | Status: AC

## 2013-06-20 MED ORDER — LISINOPRIL 20 MG PO TABS
20.0000 mg | ORAL_TABLET | Freq: Every day | ORAL | Status: DC
  Administered 2013-06-20: 20 mg via ORAL
  Filled 2013-06-20 (×2): qty 1

## 2013-06-20 MED ORDER — CEFAZOLIN SODIUM-DEXTROSE 2-3 GM-% IV SOLR
2.0000 g | Freq: Three times a day (TID) | INTRAVENOUS | Status: AC
  Administered 2013-06-20 – 2013-06-21 (×2): 2 g via INTRAVENOUS
  Filled 2013-06-20 (×2): qty 50

## 2013-06-20 MED ORDER — LORATADINE 10 MG PO TABS
10.0000 mg | ORAL_TABLET | Freq: Every day | ORAL | Status: DC
  Administered 2013-06-20: 10 mg via ORAL
  Filled 2013-06-20 (×2): qty 1

## 2013-06-20 MED ORDER — ONDANSETRON HCL 4 MG/2ML IJ SOLN: 4.0000 mg | Freq: Once | INTRAMUSCULAR | Status: DC | PRN

## 2013-06-20 MED ORDER — OXYCODONE-ACETAMINOPHEN 5-325 MG PO TABS
1.0000 | ORAL_TABLET | ORAL | Status: DC | PRN
  Administered 2013-06-20 (×2): 2 via ORAL
  Filled 2013-06-20 (×3): qty 2

## 2013-06-20 MED ORDER — DEXAMETHASONE 4 MG PO TABS
4.0000 mg | ORAL_TABLET | Freq: Four times a day (QID) | ORAL | Status: AC
  Administered 2013-06-20 – 2013-06-21 (×3): 4 mg via ORAL
  Filled 2013-06-20 (×3): qty 1

## 2013-06-20 MED ORDER — ONDANSETRON HCL 4 MG/2ML IJ SOLN
INTRAMUSCULAR | Status: DC | PRN
  Administered 2013-06-20: 4 mg via INTRAVENOUS

## 2013-06-20 MED ORDER — HYDROMORPHONE HCL PF 1 MG/ML IJ SOLN: 0.2500 mg | INTRAMUSCULAR | Status: DC | PRN

## 2013-06-20 MED ORDER — LIDOCAINE HCL 4 % MT SOLN
OROMUCOSAL | Status: DC | PRN
  Administered 2013-06-20: 4 mL via TOPICAL

## 2013-06-20 MED ORDER — INSULIN ASPART 100 UNIT/ML ~~LOC~~ SOLN
0.0000 [IU] | SUBCUTANEOUS | Status: DC
  Administered 2013-06-21: 7 [IU] via SUBCUTANEOUS

## 2013-06-20 MED ORDER — NEOSTIGMINE METHYLSULFATE 1 MG/ML IJ SOLN
INTRAMUSCULAR | Status: DC | PRN
  Administered 2013-06-20: 4 mg via INTRAVENOUS

## 2013-06-20 MED ORDER — SODIUM CHLORIDE 0.9 % IV SOLN
INTRAVENOUS | Status: AC
  Filled 2013-06-20: qty 500

## 2013-06-20 MED ORDER — BACITRACIN 50000 UNITS IM SOLR
INTRAMUSCULAR | Status: AC
  Filled 2013-06-20: qty 1

## 2013-06-20 MED ORDER — PROPOFOL 10 MG/ML IV BOLUS
INTRAVENOUS | Status: DC | PRN
  Administered 2013-06-20: 150 mg via INTRAVENOUS

## 2013-06-20 MED ORDER — HEMOSTATIC AGENTS (NO CHARGE) OPTIME
TOPICAL | Status: DC | PRN
  Administered 2013-06-20: 1 via TOPICAL

## 2013-06-20 MED ORDER — ALUM & MAG HYDROXIDE-SIMETH 200-200-20 MG/5ML PO SUSP: 30.0000 mL | Freq: Four times a day (QID) | ORAL | Status: DC | PRN

## 2013-06-20 MED ORDER — 0.9 % SODIUM CHLORIDE (POUR BTL) OPTIME
TOPICAL | Status: DC | PRN
  Administered 2013-06-20: 1000 mL

## 2013-06-20 MED ORDER — MENTHOL 3 MG MT LOZG: 1.0000 | LOZENGE | OROMUCOSAL | Status: DC | PRN

## 2013-06-20 MED ORDER — SODIUM CHLORIDE 0.9 % IR SOLN
Status: DC | PRN
  Administered 2013-06-20: 13:00:00

## 2013-06-20 MED ORDER — THROMBIN 5000 UNITS EX SOLR
CUTANEOUS | Status: DC | PRN
  Administered 2013-06-20 (×2): 5000 [IU] via TOPICAL

## 2013-06-20 MED ORDER — SUFENTANIL CITRATE 50 MCG/ML IV SOLN
INTRAVENOUS | Status: DC | PRN
  Administered 2013-06-20: 15 ug via INTRAVENOUS
  Administered 2013-06-20: 10 ug via INTRAVENOUS
  Administered 2013-06-20: 15 ug via INTRAVENOUS

## 2013-06-20 MED ORDER — LIDOCAINE HCL (CARDIAC) 20 MG/ML IV SOLN
INTRAVENOUS | Status: DC | PRN
  Administered 2013-06-20: 50 mg via INTRAVENOUS

## 2013-06-20 MED ORDER — DIAZEPAM 5 MG PO TABS: 5.0000 mg | ORAL_TABLET | Freq: Four times a day (QID) | ORAL | Status: DC | PRN

## 2013-06-20 MED ORDER — ACETAMINOPHEN 650 MG RE SUPP: 650.0000 mg | RECTAL | Status: DC | PRN

## 2013-06-20 MED ORDER — AMLODIPINE BESYLATE 10 MG PO TABS
10.0000 mg | ORAL_TABLET | Freq: Every day | ORAL | Status: DC
  Filled 2013-06-20: qty 1

## 2013-06-20 MED ORDER — METFORMIN HCL 850 MG PO TABS
850.0000 mg | ORAL_TABLET | Freq: Two times a day (BID) | ORAL | Status: DC
  Administered 2013-06-20: 850 mg via ORAL
  Filled 2013-06-20 (×4): qty 1

## 2013-06-20 MED ORDER — PHENOL 1.4 % MT LIQD: 1.0000 | OROMUCOSAL | Status: DC | PRN

## 2013-06-20 MED ORDER — BACITRACIN ZINC 500 UNIT/GM EX OINT
TOPICAL_OINTMENT | CUTANEOUS | Status: DC | PRN
  Administered 2013-06-20: 1 via TOPICAL

## 2013-06-20 MED ORDER — GLIPIZIDE 10 MG PO TABS
10.0000 mg | ORAL_TABLET | Freq: Every day | ORAL | Status: DC
  Administered 2013-06-20: 10 mg via ORAL
  Filled 2013-06-20 (×2): qty 1

## 2013-06-20 MED ORDER — PHENYLEPHRINE HCL 10 MG/ML IJ SOLN
INTRAMUSCULAR | Status: DC | PRN
  Administered 2013-06-20 (×2): 120 ug via INTRAVENOUS

## 2013-06-20 MED ORDER — MIDAZOLAM HCL 5 MG/5ML IJ SOLN
INTRAMUSCULAR | Status: DC | PRN
  Administered 2013-06-20: 2 mg via INTRAVENOUS

## 2013-06-20 SURGICAL SUPPLY — 72 items
BAG DECANTER FOR FLEXI CONT (MISCELLANEOUS) ×6 IMPLANT
BANDAGE ELASTIC 4 VELCRO ST LF (GAUZE/BANDAGES/DRESSINGS) ×3 IMPLANT
BANDAGE GAUZE 4  KLING STR (GAUZE/BANDAGES/DRESSINGS) ×3 IMPLANT
BENZOIN TINCTURE PRP APPL 2/3 (GAUZE/BANDAGES/DRESSINGS) ×3 IMPLANT
BIT DRILL NEURO 2X3.1 SFT TUCH (MISCELLANEOUS) ×4 IMPLANT
BLADE SURG 15 STRL LF DISP TIS (BLADE) ×4 IMPLANT
BLADE SURG 15 STRL SS (BLADE) ×2
BLADE ULTRA TIP 2M (BLADE) ×3 IMPLANT
BRUSH SCRUB EZ PLAIN DRY (MISCELLANEOUS) ×6 IMPLANT
BUR BARREL STRAIGHT FLUTE 4.0 (BURR) ×3 IMPLANT
BUR MATCHSTICK NEURO 3.0 LAGG (BURR) ×3 IMPLANT
CANISTER SUCTION 2500CC (MISCELLANEOUS) ×3 IMPLANT
CLIP TI MEDIUM 6 (CLIP) ×3 IMPLANT
CLOTH BEACON ORANGE TIMEOUT ST (SAFETY) ×6 IMPLANT
CONT SPEC 4OZ CLIKSEAL STRL BL (MISCELLANEOUS) ×3 IMPLANT
CORDS BIPOLAR (ELECTRODE) ×3 IMPLANT
COVER MAYO STAND STRL (DRAPES) ×3 IMPLANT
DEVICE FUSION VIST S 14X14X6MM (Trauma) ×2 IMPLANT
DISPOSABLE MIXING BOWL ×3 IMPLANT
DRAPE EXTREMITY T 121X128X90 (DRAPE) ×3 IMPLANT
DRAPE LAPAROTOMY 100X72 PEDS (DRAPES) ×3 IMPLANT
DRAPE MICROSCOPE LEICA (MISCELLANEOUS) IMPLANT
DRAPE POUCH INSTRU U-SHP 10X18 (DRAPES) ×3 IMPLANT
DRAPE PROXIMA HALF (DRAPES) ×6 IMPLANT
DRAPE SURG 17X23 STRL (DRAPES) ×6 IMPLANT
DRILL NEURO 2X3.1 SOFT TOUCH (MISCELLANEOUS) ×6
ELECT REM PT RETURN 9FT ADLT (ELECTROSURGICAL) ×3
ELECTRODE REM PT RTRN 9FT ADLT (ELECTROSURGICAL) ×2 IMPLANT
GAUZE SPONGE 4X4 16PLY XRAY LF (GAUZE/BANDAGES/DRESSINGS) ×3 IMPLANT
GLOVE BIO SURGEON STRL SZ8.5 (GLOVE) ×6 IMPLANT
GLOVE EXAM NITRILE LRG STRL (GLOVE) IMPLANT
GLOVE EXAM NITRILE MD LF STRL (GLOVE) IMPLANT
GLOVE EXAM NITRILE XL STR (GLOVE) IMPLANT
GLOVE EXAM NITRILE XS STR PU (GLOVE) IMPLANT
GLOVE SS BIOGEL STRL SZ 8 (GLOVE) ×4 IMPLANT
GLOVE SUPERSENSE BIOGEL SZ 8 (GLOVE) ×2
GOWN BRE IMP SLV AUR LG STRL (GOWN DISPOSABLE) ×3 IMPLANT
GOWN BRE IMP SLV AUR XL STRL (GOWN DISPOSABLE) ×6 IMPLANT
KIT BASIN OR (CUSTOM PROCEDURE TRAY) ×6 IMPLANT
KIT ROOM TURNOVER OR (KITS) ×6 IMPLANT
MARKER SKIN DUAL TIP RULER LAB (MISCELLANEOUS) ×6 IMPLANT
NEEDLE HYPO 22GX1.5 SAFETY (NEEDLE) ×3 IMPLANT
NEEDLE HYPO 25X1 1.5 SAFETY (NEEDLE) ×3 IMPLANT
NEEDLE SPNL 18GX3.5 QUINCKE PK (NEEDLE) ×3 IMPLANT
NS IRRIG 1000ML POUR BTL (IV SOLUTION) ×6 IMPLANT
PACK LAMINECTOMY NEURO (CUSTOM PROCEDURE TRAY) ×3 IMPLANT
PACK SURGICAL SETUP 50X90 (CUSTOM PROCEDURE TRAY) ×3 IMPLANT
PAD ARMBOARD 7.5X6 YLW CONV (MISCELLANEOUS) ×3 IMPLANT
PIN DISTRACTION 14MM (PIN) ×6 IMPLANT
PLATE ANT CERV XTEND 1 LV 12 (Plate) ×3 IMPLANT
PUTTY 2.5ML ACTIFUSE ABX (Putty) ×3 IMPLANT
RUBBERBAND STERILE (MISCELLANEOUS) IMPLANT
SCREW XTD VAR 4.2 SELF TAP 12 (Screw) ×12 IMPLANT
SPONGE GAUZE 4X4 12PLY (GAUZE/BANDAGES/DRESSINGS) ×3 IMPLANT
SPONGE INTESTINAL PEANUT (DISPOSABLE) ×6 IMPLANT
SPONGE SURGIFOAM ABS GEL SZ50 (HEMOSTASIS) ×3 IMPLANT
STOCKINETTE 4X48 STRL (DRAPES) ×3 IMPLANT
STRIP CLOSURE SKIN 1/2X4 (GAUZE/BANDAGES/DRESSINGS) ×3 IMPLANT
SUT ETHILON 3 0 PS 1 (SUTURE) ×3 IMPLANT
SUT VIC AB 0 CT1 27 (SUTURE) ×1
SUT VIC AB 0 CT1 27XBRD ANTBC (SUTURE) ×2 IMPLANT
SUT VIC AB 3-0 SH 8-18 (SUTURE) ×6 IMPLANT
SYR 20ML ECCENTRIC (SYRINGE) ×3 IMPLANT
SYR BULB 3OZ (MISCELLANEOUS) ×3 IMPLANT
SYR CONTROL 10ML LL (SYRINGE) ×3 IMPLANT
TAPE CLOTH SURG 4X10 WHT LF (GAUZE/BANDAGES/DRESSINGS) ×3 IMPLANT
TOWEL OR 17X24 6PK STRL BLUE (TOWEL DISPOSABLE) ×12 IMPLANT
TOWEL OR 17X26 10 PK STRL BLUE (TOWEL DISPOSABLE) ×3 IMPLANT
TUBE CONNECTING 12X1/4 (SUCTIONS) ×3 IMPLANT
UNDERPAD 30X30 INCONTINENT (UNDERPADS AND DIAPERS) ×3 IMPLANT
VISTA S O 14X14X6MM (Trauma) ×3 IMPLANT
WATER STERILE IRR 1000ML POUR (IV SOLUTION) ×6 IMPLANT

## 2013-06-20 NOTE — Op Note (Signed)
Brief history: The patient is a 52 year old black female who complains of right hand numbness consistent with carpal tunnel syndrome. She has failed medical management and was worked up with MCV/EMG which confirmed right carpal tunnel syndrome. I discussed the situation with the patient and she decided proceed with a right carpal tunnel release.  Preoperative diagnosis: The carpal tunnel syndrome  Postoperative diagnosis: The same  Procedure: Right carpal tunnel release  Surgeon: Dr. Delma Officer  Asst.: None  Anesthesia: MAC  Estimated blood loss: Minimal  Drains: None  Complications: None  Description of procedure: The patient was brought to the operating room by the anesthesia team. MAC anesthesia was induced. The patient remained in the supine position. The patient's right upper extremity was prepared with Betadine scrub and Betadine solution. Sterile drapes were applied.  I then injected the area to be incised with Marcaine solution. I then used a scalpel to make an incision in the palmar crease. I then dissected with the scalpel deeper and incised the superficial fascia. We inserted the Weitlanter retractor for exposure. We then identified the transverse carpal ligament. I carefully incised with a 15 blade scalpel. We then identified the median nerve. I used the Metzenbaum scissors to divide the  transverse carpal ligament both proximally and distally staying along the ulnar aspect of the median nerve. We got a good decompression of the nerve.  I then obtained hemostasis using bipolar electrocautery. We then irrigated the wound out with bacitracin solution. I then removed the retractor. We then reapproximated patient's subcutaneous tissue with interrupted 3-0 Vicryl suture. The skin was reapproximated with a running 3-0 nylon suture. The wound was then covered with bacitracin ointment. A sterile dressing was applied. The drapes were removed. Patient was subsequently transported to the  post anesthesia care unit in stable condition. All sponge instrument and needle counts were reportedly correct at the end of this case.

## 2013-06-20 NOTE — Op Note (Signed)
Brief history: The patient is a 52 year old black female who complains of neck shoulder and arm pain consistent with a cervical radiculopathy. She has failed medical management and was worked up with a cervical MRI. This demonstrated multilevel spondylosis and degenerative changes most prominent at C4-5. I discussed the various treatment options with the patient including surgery. She has weighed the risks, benefits, and alternatives surgery and decided proceed with a C4-5 anterior cervical discectomy, fusion, and plating.  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 Delma Officer  Asst.: Dr. Shirlean Kelly  Anesthesia: Gen. endotracheal  Estimated blood loss: 50 cc  Drains: None  Complications: None  Description of procedure: The patient was brought to the operating room by the anesthesia team. General endotracheal anesthesia was induced. A roll was placed under the patient's shoulders to keep the neck in the neutral position. The patient's anterior cervical region was then prepared with Betadine scrub and Betadine solution. Sterile drapes were applied.  The area to be incised was then injected with Marcaine with epinephrine solution. I then used a scalpel to make a transverse incision in the patient's left anterior neck. I used the Metzenbaum scissors to divide the platysmal muscle and then to dissect medial to the sternocleidomastoid muscle, jugular vein, and carotid artery. I carefully dissected down towards the anterior cervical spine identifying the esophagus and retracting it  medially. Then using Kitner swabs to clear soft tissue from the anterior cervical spine. We then inserted a bent spinal needle into the upper exposed intervertebral disc space. We then obtained intraoperative radiographs confirm our location.  I then used electrocautery to detach the medial border of the longus colli muscle bilaterally from the C4-5 intervertebral disc spaces. I then inserted the Caspar self-retaining retractor underneath the longus colli muscle bilaterally to provide exposure.  We then incised the intervertebral disc at C4-5. We then performed a partial intervertebral discectomy with a pituitary forceps and the Karlin curettes. I then inserted distraction screws into the vertebral bodies at C4-5. We then distracted the interspace. We then used the high-speed drill to decorticate the vertebral endplates at C4-5, to drill away the remainder of the intervertebral disc, to drill away some posterior spondylosis, and to thin out the posterior longitudinal ligament. I then incised ligament with the arachnoid knife. We then removed the ligament with a Kerrison punches undercutting the vertebral endplates and decompressing the thecal sac. We then performed foraminotomies about the bilateral C5 nerve roots. This completed the decompression at this level.  We now turned our to attention to the interbody fusion. We used the trial spacers to determine the appropriate size for the interbody prosthesis. We then pre-filled prosthesis with a combination of local morcellized autograft bone that we obtained during decompression as well as Actifuse bone graft extender. We then inserted the prosthesis into the distracted interspace at C4-5. We then removed the distraction screws. There was a good snug fit of the prosthesis in the interspace.  Having completed the fusion we now turned attention to the anterior spinal instrumentation. We used the high-speed drill to drill away some anterior spondylosis at the  disc spaces so that the plate lay down flat. We selected the appropriate length titanium anterior cervical plate.  We laid it along the anterior aspect of the vertebral bodies from C4-C5. We then drilled 12 mm holes at C4 and C5. We then secured the plate to the vertebral bodies by placing two 12 mm self-tapping screws at C4 and C5. We then obtained intraoperative radiograph. The demonstrating good position of the instrumentation. We therefore secured the screws the plate the locking each cam. This completed the instrumentation.  We then obtained hemostasis using bipolar electrocautery. We irrigated the wound out with bacitracin solution. We then removed the retractor. We inspected the esophagus for any damage. There was none apparent. We then reapproximated patient's platysmal muscle with interrupted 3-0 Vicryl suture. We then reapproximated the subcutaneous tissue with interrupted 3-0 Vicryl suture. The skin was reapproximated with Steri-Strips and benzoin. The wound was then covered with bacitracin ointment. A sterile dressing was applied. The drapes were removed. Patient was subsequently extubated by the anesthesia team and transported to the post anesthesia care unit in stable condition. All sponge instrument and needle counts were reportedly correct at the end of this case.

## 2013-06-20 NOTE — Transfer of Care (Signed)
Immediate Anesthesia Transfer of Care Note  Patient: Bethany Webb  Procedure(s) Performed: Procedure(s) with comments: ANTERIOR CERVICAL DECOMPRESSION/DISCECTOMY FUSION 1 LEVEL (N/A) - C45 anterior cervical decompression with fusion interbody prothesis plating and bonegraft CARPAL TUNNEL RELEASE (Right) - RIGHT carpal tunnel release  Patient Location: PACU  Anesthesia Type:General  Level of Consciousness: awake, sedated and patient cooperative  Airway & Oxygen Therapy: Patient Spontanous Breathing and Patient connected to nasal cannula oxygen  Post-op Assessment: Report given to PACU RN and Post -op Vital signs reviewed and stable  Post vital signs: Reviewed and stable  Complications: No apparent anesthesia complications

## 2013-06-20 NOTE — Anesthesia Postprocedure Evaluation (Signed)
  Anesthesia Post-op Note  Patient: [name]  Procedure(s) Performed: Procedure(s) with comments: ANTERIOR CERVICAL DECOMPRESSION/DISCECTOMY FUSION 1 LEVEL (N/A) - C45 anterior cervical decompression with fusion interbody prothesis plating and bonegraft CARPAL TUNNEL RELEASE (Right) - RIGHT carpal tunnel release  Patient Location: PACU  Anesthesia Type:General  Level of Consciousness: awake, alert  and oriented  Airway and Oxygen Therapy: Patient Spontanous Breathing  Post-op Pain: mild  Post-op Assessment: Post-op Vital signs reviewed, Patient's Cardiovascular Status Stable, Respiratory Function Stable, Patent Airway and Pain level controlled  Post-op Vital Signs: stable  Complications: No apparent anesthesia complications

## 2013-06-20 NOTE — Anesthesia Preprocedure Evaluation (Signed)
Anesthesia Evaluation  Patient identified by MRN, date of birth, ID band Patient awake    Reviewed: Allergy & Precautions, H&P , NPO status , Patient's Chart, lab work & pertinent test results  Airway Mallampati: II      Dental  (+) Teeth Intact and Dental Advisory Given   Pulmonary  breath sounds clear to auscultation        Cardiovascular Rhythm:Regular Rate:Normal     Neuro/Psych    GI/Hepatic   Endo/Other    Renal/GU      Musculoskeletal   Abdominal   Peds  Hematology   Anesthesia Other Findings   Reproductive/Obstetrics                           Anesthesia Physical Anesthesia Plan  ASA: III  Anesthesia Plan: General   Post-op Pain Management:    Induction: Intravenous  Airway Management Planned: Oral ETT  Additional Equipment:   Intra-op Plan:   Post-operative Plan: Extubation in OR  Informed Consent: I have reviewed the patients History and Physical, chart, labs and discussed the procedure including the risks, benefits and alternatives for the proposed anesthesia with the patient or authorized representative who has indicated his/her understanding and acceptance.   Dental advisory given  Plan Discussed with: CRNA, Anesthesiologist and Surgeon  Anesthesia Plan Comments: (Cervical spondylosis with C4 osteophyte with foraminal narrowing Htn Type 2 DM glucose 129  Plan GA with oral ETT  Kipp Brood, MD)        Anesthesia Quick Evaluation

## 2013-06-20 NOTE — OR Nursing (Signed)
Procedure one, carpal tunnel release ended at 1302

## 2013-06-20 NOTE — H&P (Signed)
Subjective: Seen is a 52 year old black female who has complained of neck, hand,  and arm pain/numbness. She has failed medical management and was worked up with a cervical myelo CT and MCV EMGs. 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 The [facility] EMGs demonstrate a carpal tunnel syndrome. I discussed the situation with the patient. We discussed the various treatment options including a C4-5 anterior cervical discectomy, fusion, and plating, as well as a right carpal tunnel release. The patient has weighed the risks, benefits, and alternatives surgery and decided proceed with the surgery.  Past Medical History  Diagnosis Date  . Hypertension   . Hyperlipidemia   . Allergy   . Supraspinatus tendonitis 2/12    Dr. Darrelyn Hillock  . Biceps tendonitis 2/12    Dr. Darrelyn Hillock  . History of mammogram 05/25/12    stable, no suspicious finding, extremely dense breast tissue  . S/P hysterectomy   . Neutropenia, unspecified 02/2013    slight, likely ethnicity related  . 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 Diabetes mellitus     type II  . Pneumococcal vaccine refused 03/2013  . Refused influenza vaccine 03/2013  . Wears glasses   . History of blood transfusion 1988    hysterectomy    Past Surgical History  Procedure Laterality Date  . Breast reduction surgery    . Exploratory laparotomy w/ pull-through  1980    due to bloody aspirate  . Abdominal hysterectomy  1990    and BSO; endometriosis  . Colonoscopy  03/06/12    sigmoid polyp; repeat 10 years; Dr. Mariana Kaufman    Allergies  Allergen Reactions  . Codeine Nausea Only    History  Substance Use Topics  . Smoking status: Never Smoker   . Smokeless tobacco: Never Used  . Alcohol Use: 0.0 oz/week    0 Glasses of wine per week     Comment: ocas    Family History  Problem Relation Age of Onset  . Cancer Mother     breast  .  Diabetes Mother   . Heart disease Mother 70  . Heart disease Father 70    died of MI, CABG   . Multiple sclerosis Sister   . Asthma Brother   . Cancer Maternal Uncle     hematologic  . Hypertension Maternal Grandmother   . Cancer Maternal Grandfather     prostate  . Hypertension Maternal Grandfather    Prior to Admission medications   Medication Sig Start Date End Date Taking? Authorizing Provider  amLODipine (NORVASC) 10 MG tablet Take 1 tablet (10 mg total) by mouth daily. 04/17/13  Yes Kermit Balo Tysinger, PA-C  cetirizine (ZYRTEC) 10 MG tablet Take 10 mg by mouth daily.   Yes Historical Provider, MD  glipiZIDE (GLUCOTROL) 10 MG tablet Take 1 tablet (10 mg total) by mouth daily. 06/12/13  Yes Kermit Balo Tysinger, PA-C  ibuprofen (ADVIL,MOTRIN) 200 MG tablet Take 400 mg by mouth every 6 (six) hours as needed for pain.   Yes Historical Provider, MD  lisinopril-hydrochlorothiazide (PRINZIDE,ZESTORETIC) 20-25 MG per tablet Take 1 tablet by mouth daily. 06/12/13  Yes Kermit Balo Tysinger, PA-C  metFORMIN (GLUCOPHAGE) 850 MG tablet Take 1 tablet (850 mg total) by mouth 2 (two) times daily with a meal. 11/29/12 11/29/13 Yes Kermit Balo Tysinger, PA-C  pravastatin (PRAVACHOL) 80 MG tablet Take 1 tablet (80 mg total)  by mouth every evening. 06/12/13 06/12/14 Yes Kermit Balo Tysinger, PA-C     Review of Systems  Positive ROS: As above  All other systems have been reviewed and were otherwise negative with the exception of those mentioned in the HPI and as above.  Objective: Vital signs in last 24 hours: Temp:  [97.2 F (36.2 C)] 97.2 F (36.2 C) (06/26 0825) Pulse Rate:  [90] 90 (06/26 0825) Resp:  [20] 20 (06/26 0825) BP: (145)/(83) 145/83 mmHg (06/26 0825) SpO2:  [99 %] 99 % (06/26 0825)  General Appearance: Alert, cooperative, no distress, appears stated age Head: Normocephalic, without obvious abnormality, atraumatic Eyes: PERRL, conjunctiva/corneas clear, EOM's intact, fundi benign, both eyes       Ears: Normal TM's and external ear canals, both ears Throat: Lips, mucosa, and tongue normal; teeth and gums normal Neck: Supple, symmetrical, trachea midline, no adenopathy; thyroid: No enlargement/tenderness/nodules; no carotid bruit or JVD Back: Symmetric, no curvature, ROM normal, no CVA tenderness Lungs: Clear to auscultation bilaterally, respirations unlabored Heart: Regular rate and rhythm, S1 and S2 normal, no murmur, rub or gallop Abdomen: Soft, non-tender, bowel sounds active all four quadrants, no masses, no organomegaly Extremities: Extremities normal, atraumatic, no cyanosis or edema Pulses: 2+ and symmetric all extremities Skin: Skin color, texture, turgor normal, no rashes or lesions  NEUROLOGIC:   Mental status: alert and oriented, no aphasia, good attention span, Fund of knowledge/ memory ok Motor Exam - grossly normal Sensory Exam - grossly normal except the patient has decreased sensation in the right median nerve distribution. Reflexes:  Coordination - grossly normal Gait - grossly normal Balance - grossly normal Cranial Nerves: I: smell Not tested  II: visual acuity  OS: Normal    OD: Normal   II: visual fields Full to confrontation  II: pupils Equal, round, reactive to light  III,VII: ptosis None  III,IV,VI: extraocular muscles  Full ROM  V: mastication Normal  V: facial light touch sensation  Normal  V,VII: corneal reflex  Present  VII: facial muscle function - upper  Normal  VII: facial muscle function - lower Normal  VIII: hearing Not tested  IX: soft palate elevation  Normal  IX,X: gag reflex Present  XI: trapezius strength  5/5  XI: sternocleidomastoid strength 5/5  XI: neck flexion strength  5/5  XII: tongue strength  Normal    Data Review Lab Results  Component Value Date   WBC 3.8* 06/18/2013   HGB 12.2 06/18/2013   HCT 36.2 06/18/2013   MCV 84.8 06/18/2013   PLT 312 06/18/2013   Lab Results  Component Value Date   NA 140 06/18/2013   K  3.0* 06/18/2013   CL 101 06/18/2013   CO2 27 06/18/2013   BUN 11 06/18/2013   CREATININE 0.81 06/18/2013   GLUCOSE 122* 06/18/2013   No results found for this basename: INR, PROTIME    Assessment/Plan: 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 I discussed the situation with the patient. I have reviewed her myelo CT and [facility] EMG report with her. We have discussed the various treatment options including surgery. I described the surgical treatment option of a C4-5 anterior cervical discectomy, fusion, and plating as was the right carpal tunnel release. I have described the surgery to her. I've shown her surgical models. We have discussed the risks, benefits, alternatives, and likelihood of achieving our goals with surgery. I've answered all the patient's questions. She wants to proceed with surgery.   Cristi Loron 06/20/2013  11:34 AM

## 2013-06-21 LAB — GLUCOSE, CAPILLARY: Glucose-Capillary: 209 mg/dL — ABNORMAL HIGH (ref 70–99)

## 2013-06-21 MED ORDER — OXYCODONE-ACETAMINOPHEN 10-325 MG PO TABS: 1.0000 | ORAL_TABLET | ORAL | Status: DC | PRN

## 2013-06-21 MED ORDER — DIAZEPAM 5 MG PO TABS: 5.0000 mg | ORAL_TABLET | Freq: Four times a day (QID) | ORAL | Status: DC | PRN

## 2013-06-21 MED ORDER — DSS 100 MG PO CAPS: 100.0000 mg | ORAL_CAPSULE | Freq: Two times a day (BID) | ORAL | Status: DC

## 2013-06-21 NOTE — Discharge Summary (Signed)
Physician Discharge Summary  Patient ID: CAMIRA GEIDEL MRN: [id number] DOB/AGE: August 28, 1961 52 y.o.  Admit date: 06/20/2013 Discharge date: 06/21/2013  Admission Diagnoses: C4-5 disc degeneration, spondylosis, stenosis, cervical radiculopathy, cervicalgia, right carpal tunnel syndrome  Discharge Diagnoses: The same Active Problems:   * No active hospital problems. *   Discharged Condition: good  Hospital Course: I performed a C4-5 anterior cervical discectomy, fusion, and plating as well as a right carpal tunnel release on the patient on 06/20/2013. The surgery went well.  The patient's postoperative course was unremarkable. On postop day #1 the patient requested discharge to home. She was given oral and written discharge instructions. All her questions were answered.  Consults: None Significant Diagnostic Studies: none Treatments: C4-5 intracervical discectomy, fusion, plating right carpal tunnel release Discharge Exam: Blood pressure 114/58, pulse 93, temperature 97.8 F (36.6 C), temperature source Oral, resp. rate 20, height 5\' 4"  (1.626 m), weight 80.377 kg (177 lb 3.2 oz), SpO2 97.00%. The patient is alert and pleasant. She looks well. Her dressing is clean and dry. There is no evidence of cervical hematoma or shift. Her strength is normal.  Disposition: Home  Discharge Orders   Future Orders Complete By Expires     Call MD for:  difficulty breathing, headache or visual disturbances  As directed     Call MD for:  extreme fatigue  As directed     Call MD for:  hives  As directed     Call MD for:  persistant dizziness or light-headedness  As directed     Call MD for:  persistant nausea and vomiting  As directed     Call MD for:  redness, tenderness, or signs of infection (pain, swelling, redness, odor or green/yellow discharge around incision site)  As directed     Call MD for:  severe uncontrolled pain  As directed     Call MD for:  temperature >100.4  As directed     Diet - low sodium heart healthy  As directed     Discharge instructions  As directed     Comments:      Call [phone] for a followup appointment. Take a stool softener while you are using pain medications.    Driving Restrictions  As directed     Comments:      Do not drive for 2 weeks.    Increase activity slowly  As directed     Lifting restrictions  As directed     Comments:      Do not lift more than 5 pounds. No excessive bending or twisting.    May shower / Bathe  As directed     Comments:      He may shower after the pain she is removed 3 days after surgery. Leave the incision alone.    Remove dressing in 48 hours  As directed     Comments:      Your stitches are under the scan and will dissolve by themselves. The Steri-Strips will fall off after you take a few showers. Do not rub back or pick at the wound, Leave the wound alone.        Medication List    STOP taking these medications       ibuprofen 200 MG tablet  Commonly known as:  ADVIL,MOTRIN      TAKE these medications       amLODipine 10 MG tablet  Commonly known as:  NORVASC  Take 1 tablet (10 mg  total) by mouth daily.     cetirizine 10 MG tablet  Commonly known as:  ZYRTEC  Take 10 mg by mouth daily.     diazepam 5 MG tablet  Commonly known as:  VALIUM  Take 1 tablet (5 mg total) by mouth every 6 (six) hours as needed.     DSS 100 MG Caps  Take 100 mg by mouth 2 (two) times daily.     glipiZIDE 10 MG tablet  Commonly known as:  GLUCOTROL  Take 1 tablet (10 mg total) by mouth daily.     lisinopril-hydrochlorothiazide 20-25 MG per tablet  Commonly known as:  PRINZIDE,ZESTORETIC  Take 1 tablet by mouth daily.     metFORMIN 850 MG tablet  Commonly known as:  GLUCOPHAGE  Take 1 tablet (850 mg total) by mouth 2 (two) times daily with a meal.     oxyCODONE-acetaminophen 10-325 MG per tablet  Commonly known as:  PERCOCET  Take 1 tablet by mouth every 4 (four) hours as needed for pain.      pravastatin 80 MG tablet  Commonly known as:  PRAVACHOL  Take 1 tablet (80 mg total) by mouth every evening.         SignedCristi Loron 06/21/2013, 7:48 AM

## 2013-06-21 NOTE — Progress Notes (Signed)
UR COMPLETED  

## 2013-06-24 ENCOUNTER — Encounter (HOSPITAL_COMMUNITY): Payer: Self-pay | Admitting: Neurosurgery

## 2013-12-07 ENCOUNTER — Other Ambulatory Visit: Payer: Self-pay | Admitting: Medical

## 2013-12-09 ENCOUNTER — Other Ambulatory Visit: Payer: Self-pay | Admitting: Medical

## 2013-12-09 NOTE — Telephone Encounter (Signed)
PLEASE, SCHEDULE A MEDICATION FOLLOW UP SOON.

## 2013-12-16 ENCOUNTER — Ambulatory Visit (INDEPENDENT_AMBULATORY_CARE_PROVIDER_SITE_OTHER): Payer: 59 | Admitting: Medical

## 2013-12-16 ENCOUNTER — Encounter: Payer: Self-pay | Admitting: Medical

## 2013-12-16 VITALS — BP 130/88 | HR 88 | Temp 98.2°F | Resp 16 | Wt 166.0 lb

## 2013-12-16 DIAGNOSIS — E785 Hyperlipidemia, unspecified: Secondary | ICD-10-CM

## 2013-12-16 DIAGNOSIS — I10 Essential (primary) hypertension: Secondary | ICD-10-CM

## 2013-12-16 DIAGNOSIS — J309 Allergic rhinitis, unspecified: Secondary | ICD-10-CM

## 2013-12-16 DIAGNOSIS — E119 Type 2 diabetes mellitus without complications: Secondary | ICD-10-CM

## 2013-12-16 MED ORDER — PRAVASTATIN SODIUM 80 MG PO TABS: 80.0000 mg | ORAL_TABLET | Freq: Every evening | ORAL | Status: DC

## 2013-12-16 MED ORDER — AZELASTINE-FLUTICASONE 137-50 MCG/ACT NA SUSP: 1.0000 | Freq: Two times a day (BID) | NASAL | Status: DC

## 2013-12-16 MED ORDER — LISINOPRIL-HYDROCHLOROTHIAZIDE 20-25 MG PO TABS: 1.0000 | ORAL_TABLET | Freq: Every day | ORAL | Status: DC

## 2013-12-16 MED ORDER — AMLODIPINE BESYLATE 10 MG PO TABS: 10.0000 mg | ORAL_TABLET | Freq: Every day | ORAL | Status: DC

## 2013-12-16 MED ORDER — METFORMIN HCL 850 MG PO TABS: 850.0000 mg | ORAL_TABLET | Freq: Two times a day (BID) | ORAL | Status: DC

## 2013-12-16 MED ORDER — GLIPIZIDE 10 MG PO TABS: 10.0000 mg | ORAL_TABLET | Freq: Every day | ORAL | Status: DC

## 2013-12-16 NOTE — Progress Notes (Deleted)
   Subjective:    Patient ID: Bethany Webb, female    DOB: [birth year], 52 y.o.   MRN: [id number]  HPI    Review of Systems     Objective:   Physical Exam        Assessment & Plan:

## 2013-12-16 NOTE — Progress Notes (Signed)
  Subjective:    Bethany Webb is a 52 y.o. female who presents for routine f/u.  Home blood sugar records: not everyday/ once and a while, usually under 130 fasting.  Current symptoms/problems include none and have been stable. Daily foot checks: YES  Any foot concerns:NONE Last eye exam:  Over a year ago   Medication compliance: Current diet: well balanced Current exercise: none Known diabetic complications: none Cardiovascular risk factors: diabetes mellitus  compliant with medication for BP, cholesterol.    Only big issue is nasal congestion, runny nose, sneezing.  Using zyrtec without much relief.   The following portions of the patient's history were reviewed and updated as appropriate: allergies, current medications, past family history, past medical history, past social history, past surgical history and problem list.  ROS as in subjective above    Objective:    BP 130/88  Pulse 88  Temp(Src) 98.2 F (36.8 C) (Oral)  Resp 16  Wt 166 lb (75.297 kg)   General appearance: alert, no distress, WD/WN Neck: supple, no lymphadenopathy, no thyromegaly, no masses Heart: RRR, normal S1, S2, no murmurs Lungs: CTA bilaterally, no wheezes, rhonchi, or rales Pulses: 2+ symmetric, upper and lower extremities, normal cap refill Ext: no edema Foot exam: right medial great toenail with linear slither of brown coloration, more diffuse left small toenail brown coloration, all unchanged per pt for years Neuro: foot monofilament exam normal    Assessment:   Encounter Diagnoses  Name Primary?  . Type II or unspecified type diabetes mellitus without mention of complication, not stated as uncontrolled Yes  . Allergic rhinitis   . Essential hypertension, benign   . Hyperlipidemia         Plan:    hgbA1C 6.8% today.    C/t same medications, c/t healthy diet, increase exercise frequently.   Reviewed last panel of labs.  Recheck in April 2015, will be due for breast/pelvic  at that time, possibly pap smear.   She is up to date on mammogram, colonoscopy.   Allergic rhinitis - c/t zyrtec, begin samples of Dymista nasal spray BID.

## 2014-05-28 ENCOUNTER — Emergency Department (HOSPITAL_BASED_OUTPATIENT_CLINIC_OR_DEPARTMENT_OTHER)
Admission: EM | Admit: 2014-05-28 | Discharge: 2014-05-28 | Disposition: A | Discharge status: Home / Self Care | Payer: 59 | Attending: Emergency Medicine | Admitting: Emergency Medicine

## 2014-05-28 ENCOUNTER — Encounter (HOSPITAL_BASED_OUTPATIENT_CLINIC_OR_DEPARTMENT_OTHER): Payer: Self-pay | Admitting: Emergency Medicine

## 2014-05-28 DIAGNOSIS — Z862 Personal history of diseases of the blood and blood-forming organs and certain disorders involving the immune mechanism: Secondary | ICD-10-CM | POA: Insufficient documentation

## 2014-05-28 DIAGNOSIS — E785 Hyperlipidemia, unspecified: Secondary | ICD-10-CM | POA: Insufficient documentation

## 2014-05-28 DIAGNOSIS — L03319 Cellulitis of trunk, unspecified: Secondary | ICD-10-CM

## 2014-05-28 DIAGNOSIS — W57XXXA Bitten or stung by nonvenomous insect and other nonvenomous arthropods, initial encounter: Principal | ICD-10-CM

## 2014-05-28 DIAGNOSIS — S40269A Insect bite (nonvenomous) of unspecified shoulder, initial encounter: Principal | ICD-10-CM

## 2014-05-28 DIAGNOSIS — S20169A Insect bite (nonvenomous) of breast, unspecified breast, initial encounter: Secondary | ICD-10-CM

## 2014-05-28 DIAGNOSIS — IMO0002 Reserved for concepts with insufficient information to code with codable children: Secondary | ICD-10-CM | POA: Insufficient documentation

## 2014-05-28 DIAGNOSIS — T63481A Toxic effect of venom of other arthropod, accidental (unintentional), initial encounter: Secondary | ICD-10-CM

## 2014-05-28 DIAGNOSIS — L02219 Cutaneous abscess of trunk, unspecified: Secondary | ICD-10-CM | POA: Insufficient documentation

## 2014-05-28 DIAGNOSIS — I10 Essential (primary) hypertension: Secondary | ICD-10-CM | POA: Insufficient documentation

## 2014-05-28 DIAGNOSIS — L089 Local infection of the skin and subcutaneous tissue, unspecified: Secondary | ICD-10-CM | POA: Insufficient documentation

## 2014-05-28 DIAGNOSIS — E119 Type 2 diabetes mellitus without complications: Secondary | ICD-10-CM | POA: Insufficient documentation

## 2014-05-28 DIAGNOSIS — L0390 Cellulitis, unspecified: Secondary | ICD-10-CM

## 2014-05-28 DIAGNOSIS — Y9389 Activity, other specified: Secondary | ICD-10-CM | POA: Insufficient documentation

## 2014-05-28 DIAGNOSIS — Y9289 Other specified places as the place of occurrence of the external cause: Secondary | ICD-10-CM | POA: Insufficient documentation

## 2014-05-28 DIAGNOSIS — Z79899 Other long term (current) drug therapy: Secondary | ICD-10-CM | POA: Insufficient documentation

## 2014-05-28 LAB — CBG MONITORING, ED: Glucose-Capillary: 121 mg/dL — ABNORMAL HIGH (ref 70–99)

## 2014-05-28 MED ORDER — CLINDAMYCIN HCL 300 MG PO CAPS: 300.0000 mg | ORAL_CAPSULE | Freq: Three times a day (TID) | ORAL | Status: DC

## 2014-05-28 MED ORDER — HYDROCORTISONE 1 % EX CREA: TOPICAL_CREAM | CUTANEOUS | Status: DC

## 2014-05-28 MED ORDER — CLINDAMYCIN HCL 150 MG PO CAPS
300.0000 mg | ORAL_CAPSULE | Freq: Once | ORAL | Status: AC
  Administered 2014-05-28: 300 mg via ORAL
  Filled 2014-05-28: qty 2

## 2014-05-28 NOTE — ED Notes (Signed)
MD at bedside. 

## 2014-05-28 NOTE — ED Notes (Signed)
Pt recently returned from [place] and noticed a rash on her arms and back that began Sunday.

## 2014-05-28 NOTE — Discharge Instructions (Signed)
Take clinda for a week.  Take benadryl for itchiness. Don't drive with it.   Apply hydrocortisone cream to itchy areas as needed.   Follow up with your doctor.   Return to ER if you have fever, worse rash, purulent drainage.
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
## 2014-06-10 ENCOUNTER — Telehealth: Payer: Self-pay | Admitting: Family Medicine

## 2014-06-10 NOTE — Telephone Encounter (Signed)
ER letter sent 

## 2014-08-05 ENCOUNTER — Emergency Department (HOSPITAL_COMMUNITY)
Admission: EM | Admit: 2014-08-05 | Discharge: 2014-08-05 | Disposition: A | Discharge status: Home / Self Care | Payer: 59 | Source: Home / Self Care | Attending: Family Medicine | Admitting: Family Medicine

## 2014-08-05 ENCOUNTER — Encounter (HOSPITAL_COMMUNITY): Payer: Self-pay | Admitting: Emergency Medicine

## 2014-08-05 DIAGNOSIS — R21 Rash and other nonspecific skin eruption: Secondary | ICD-10-CM

## 2014-08-05 MED ORDER — HYDROXYZINE HCL 25 MG PO TABS: 25.0000 mg | ORAL_TABLET | ORAL | Status: DC | PRN

## 2014-08-05 MED ORDER — BETAMETHASONE DIPROPIONATE AUG 0.05 % EX CREA: TOPICAL_CREAM | Freq: Two times a day (BID) | CUTANEOUS | Status: DC

## 2014-08-05 MED ORDER — CLINDAMYCIN HCL 300 MG PO CAPS: 300.0000 mg | ORAL_CAPSULE | Freq: Three times a day (TID) | ORAL | Status: DC

## 2014-08-05 NOTE — ED Provider Notes (Signed)
CSN: [id number]     Arrival date & time 08/05/14  74 History   First MD Initiated Contact with Patient 08/05/14 1600     Chief Complaint  Patient presents with  . Rash   (Consider location/radiation/quality/duration/timing/severity/associated sxs/prior Treatment) HPI Comments: 53 year old female presents for evaluation of a rash. Starting Saturday she began to have itchy spots on her left arm. She started taking leftover clindamycin from the last time she was sick which helped the area on her arm get better, but now she is having more similar red, swollen, pleuritic areas on her left shoulder, face, neck. These areas are extremely itchy and are not responding to hydrocortisone cream.  No systemic sxs   Patient is a 53 y.o. female presenting with rash.  Rash   Past Medical History  Diagnosis Date  . Hypertension   . Hyperlipidemia   . Allergy   . Supraspinatus tendonitis 2/12    Dr. Gladstone Lighter  . Biceps tendonitis 2/12    Dr. Gladstone Lighter  . History of mammogram 05/25/12    stable, no suspicious finding, extremely dense breast tissue  . S/P hysterectomy   . Neutropenia, unspecified 02/2013    slight, likely ethnicity related  . 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 Diabetes mellitus     type II  . Pneumococcal vaccine refused 03/2013  . Refused influenza vaccine 03/2013  . Wears glasses   . History of blood transfusion 1988    hysterectomy   Past Surgical History  Procedure Laterality Date  . Breast reduction surgery    . Exploratory laparotomy w/ pull-through  1980    due to bloody aspirate  . Abdominal hysterectomy  1990    and BSO; endometriosis  . Colonoscopy  03/06/12    sigmoid polyp; repeat 10 years; Dr. Tora Duck  . Anterior cervical decomp/discectomy fusion N/A 06/20/2013    Procedure: ANTERIOR CERVICAL DECOMPRESSION/DISCECTOMY FUSION 1 LEVEL;  Surgeon: Ophelia Charter, MD;  Location: [facility];  Service: Neurosurgery;  Laterality: N/A;  C45 anterior cervical decompression with fusion interbody prothesis plating and bonegraft  . Carpal tunnel release Right 06/20/2013    Procedure: CARPAL TUNNEL RELEASE;  Surgeon: Ophelia Charter, MD;  Location: [place];  Service: Neurosurgery;  Laterality: Right;  RIGHT carpal tunnel release   Family History  Problem Relation Age of Onset  . Cancer Mother     breast  . Diabetes Mother   . Heart disease Mother 53  . Heart disease Father 46    died of MI, CABG   . Multiple sclerosis Sister   . Asthma Brother   . Cancer Maternal Uncle     hematologic  . Hypertension Maternal Grandmother   . Cancer Maternal Grandfather     prostate  . Hypertension Maternal Grandfather    History  Substance Use Topics  . Smoking status: Never Smoker   . Smokeless tobacco: Never Used  . Alcohol Use: 0.0 oz/week    0 Glasses of wine per week     Comment: ocas   OB History   Grav Para Term Preterm Abortions TAB SAB Ect Mult Living                 Review of Systems  Skin: Positive for rash.  All other systems reviewed and are negative.   Allergies  Codeine  Home Medications   Prior to Admission medications  Medication Sig Start Date End Date Taking? Authorizing Provider  amLODipine (NORVASC) 10 MG tablet Take 1 tablet (10 mg total) by mouth daily. 12/16/13   Camelia Eng Tysinger, PA-C  augmented betamethasone dipropionate (DIPROLENE AF) 0.05 % cream Apply topically 2 (two) times daily. 08/05/14   Liam Graham, PA-C  Azelastine-Fluticasone (DYMISTA) 137-50 MCG/ACT SUSP Place 1 spray into the nose 2 (two) times daily. 12/16/13   Camelia Eng Tysinger, PA-C  cetirizine (ZYRTEC) 10 MG tablet Take 10 mg by mouth daily.    Historical Provider, MD  clindamycin (CLEOCIN) 300 MG capsule Take 1 capsule (300 mg total) by mouth 3 (three) times daily. X 7 days 05/28/14   Wandra Arthurs, MD  clindamycin (CLEOCIN) 300 MG capsule Take 1 capsule (300 mg total)  by mouth 3 (three) times daily. 08/05/14   Freeman Caldron Nadine Ryle, PA-C  docusate sodium 100 MG CAPS Take 100 mg by mouth 2 (two) times daily. 06/21/13   Newman Pies, MD  glipiZIDE (GLUCOTROL) 10 MG tablet Take 1 tablet (10 mg total) by mouth daily. 12/16/13   Camelia Eng Tysinger, PA-C  hydrocortisone cream 1 % Apply to affected area 2 times daily 05/28/14   Wandra Arthurs, MD  hydrOXYzine (ATARAX/VISTARIL) 25 MG tablet Take 1 tablet (25 mg total) by mouth every 4 (four) hours as needed for itching. 08/05/14   Liam Graham, PA-C  lisinopril-hydrochlorothiazide (PRINZIDE,ZESTORETIC) 20-25 MG per tablet Take 1 tablet by mouth daily. 12/16/13   Camelia Eng Tysinger, PA-C  metFORMIN (GLUCOPHAGE) 850 MG tablet Take 1 tablet (850 mg total) by mouth 2 (two) times daily with a meal. 12/16/13   Camelia Eng Tysinger, PA-C  pravastatin (PRAVACHOL) 80 MG tablet Take 1 tablet (80 mg total) by mouth every evening. 12/16/13 12/16/14  Camelia Eng Tysinger, PA-C   BP 145/52  Pulse 90  Temp(Src) 98.5 F (36.9 C) (Oral)  Resp 18  SpO2 100% Physical Exam  Nursing note and vitals reviewed. Constitutional: She is oriented to person, place, and time. Vital signs are normal. She appears well-developed and well-nourished. No distress.  HENT:  Head: Normocephalic and atraumatic.  Pulmonary/Chest: Effort normal. No respiratory distress.  Neurological: She is alert and oriented to person, place, and time. She has normal strength. Coordination normal.  Skin: Skin is warm and dry. Rash (erythematous, circumscribed, indurated rash on the back of the left shoulder, 3 spots on the left side of the face, and one spot on the left arm) noted. She is not diaphoretic.  Psychiatric: She has a normal mood and affect. Judgment normal.    ED Course  Procedures (including critical care time) Labs Review Labs Reviewed - No data to display  Imaging Review No results found.   MDM   1. Rash    Local reaction v. Infection, treat for both, f/u if  no improvement in a few days    Meds ordered this encounter  Medications  . clindamycin (CLEOCIN) 300 MG capsule    Sig: Take 1 capsule (300 mg total) by mouth 3 (three) times daily.    Dispense:  21 capsule    Refill:  0    Order Specific Question:  Supervising Provider    Answer:  Lynne Leader, [place]  . augmented betamethasone dipropionate (DIPROLENE AF) 0.05 % cream    Sig: Apply topically 2 (two) times daily.    Dispense:  30 g    Refill:  0    Order Specific Question:  Supervising Provider  Answer:  Lynne Leader, [place]  . hydrOXYzine (ATARAX/VISTARIL) 25 MG tablet    Sig: Take 1 tablet (25 mg total) by mouth every 4 (four) hours as needed for itching.    Dispense:  20 tablet    Refill:  0    Order Specific Question:  Supervising Provider    Answer:  Lynne Leader, [place]       Liam Graham, PA-C 08/05/14 [phone]

## 2014-08-05 NOTE — Discharge Instructions (Signed)

## 2014-08-05 NOTE — ED Notes (Signed)
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

## 2014-08-06 ENCOUNTER — Encounter: Payer: Self-pay | Admitting: Medical

## 2014-08-06 ENCOUNTER — Ambulatory Visit (INDEPENDENT_AMBULATORY_CARE_PROVIDER_SITE_OTHER): Payer: 59 | Admitting: Medical

## 2014-08-06 VITALS — BP 122/80 | HR 82 | Temp 97.9°F | Resp 14 | Wt 169.0 lb

## 2014-08-06 DIAGNOSIS — W57XXXA Bitten or stung by nonvenomous insect and other nonvenomous arthropods, initial encounter: Secondary | ICD-10-CM

## 2014-08-06 DIAGNOSIS — T148 Other injury of unspecified body region: Secondary | ICD-10-CM

## 2014-08-06 DIAGNOSIS — R21 Rash and other nonspecific skin eruption: Secondary | ICD-10-CM

## 2014-08-06 NOTE — Progress Notes (Signed)
Subjective: Here for rash  She was seen by urgent care yesterday for the same.  Several days ago she got bitten by several mosquitoes while camping. She had used Deet/insect repellant but it did not seem to make a difference.  Over the course of the last few days got several large swollen lesions on her arms neck and face from the mosquito bites. Urgent care yesterday prescribed steroid cream, hydroxyzine oral, clindamycin antibiotic.  In the last 24 hours the lesions are significantly improved, but she wanted followup.\ here and make sure if she needed continue this regimen.  In recent years she has had more significant reactions to mosquito bites. Otherwise feels fine, no other complaint  Review of systems as in subjective  Objective Gen: wd/wn, nad Skin:  Left distal dorsal arm with 3cm area of faint brown coloration and central bite, that was worse swollen and red per pt, several other small hearing round raised bite areas of left neck, left shoulder, left and right forearms, and right forehead c/w mosquito bites.   Assessment: Encounter Diagnoses  Name Primary?  . Insect bite Yes  . Rash and nonspecific skin eruption     Plan: Significantly improved in the last 24 hours. Continue hydroxyzine tablets the next few days,steroid cream the next few days.    clindamycin x 3-4 more days then stop.  Discussed precautionary measures in the future to limit mosquito bites when possible.  F/u prn.

## 2014-08-10 NOTE — ED Provider Notes (Signed)
Medical screening examination/treatment/procedure(s) were performed by a resident physician or non-physician practitioner and as the supervising physician I was immediately available for consultation/collaboration.  Linna Darner, MD Family Medicine   Waldemar Dickens, MD 08/10/14 Dyann Kief

## 2014-08-22 ENCOUNTER — Ambulatory Visit (INDEPENDENT_AMBULATORY_CARE_PROVIDER_SITE_OTHER): Payer: 59 | Admitting: Medical

## 2014-08-22 ENCOUNTER — Encounter: Payer: Self-pay | Admitting: Medical

## 2014-08-22 VITALS — BP 130/80 | HR 92 | Temp 97.5°F | Resp 14 | Wt 172.0 lb

## 2014-08-22 DIAGNOSIS — Z1239 Encounter for other screening for malignant neoplasm of breast: Secondary | ICD-10-CM

## 2014-08-22 DIAGNOSIS — Z9889 Other specified postprocedural states: Secondary | ICD-10-CM

## 2014-08-22 DIAGNOSIS — N644 Mastodynia: Secondary | ICD-10-CM

## 2014-08-22 NOTE — Progress Notes (Signed)
Subjective: Here today for one-week history of right breast discomfort, throbbing radiating pain. Hurts to lie on that side.  She denies redness, fever, lymph node swelling, weight loss, night sweats, nipple discharge, no specific nodule or lump. She does check her breasts monthly.  Last mammogram 2013. She has a history of bilateral breast reduction surgery.  The only recent change was that she was camping and has been doing a lot of moving and packing up things.  No neck pain, back pain, abdominal pain, arm pain. No rash or redness.  No other aggravating or relieving factors. No other c/o.  Past Medical History  Diagnosis Date  . Hypertension   . Hyperlipidemia   . Allergy   . Supraspinatus tendonitis 2/12    Dr. Gladstone Lighter  . Biceps tendonitis 2/12    Dr. Gladstone Lighter  . History of mammogram 05/25/12    stable, no suspicious finding, extremely dense breast tissue  . S/P hysterectomy   . Neutropenia, unspecified 02/2013    slight, likely ethnicity related  . 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 Diabetes mellitus     type II  . Pneumococcal vaccine refused 03/2013  . Refused influenza vaccine 03/2013  . Wears glasses   . History of blood transfusion 1988    hysterectomy   Past Surgical History  Procedure Laterality Date  . Breast reduction surgery    . Exploratory laparotomy w/ pull-through  1980    due to bloody aspirate  . Abdominal hysterectomy  1990    and BSO; endometriosis  . Colonoscopy  03/06/12    sigmoid polyp; repeat 10 years; Dr. Tora Duck  . Anterior cervical decomp/discectomy fusion N/A 06/20/2013    Procedure: ANTERIOR CERVICAL DECOMPRESSION/DISCECTOMY FUSION 1 LEVEL;  Surgeon: Ophelia Charter, MD;  Location: [facility];  Service: Neurosurgery;  Laterality: N/A;  C45 anterior cervical decompression with fusion interbody prothesis plating and bonegraft  . Carpal tunnel release Right  06/20/2013    Procedure: CARPAL TUNNEL RELEASE;  Surgeon: Ophelia Charter, MD;  Location: [facility];  Service: Neurosurgery;  Laterality: Right;  RIGHT carpal tunnel release   ROS as in subjective  Objective: Gen: wd, wn, nad Skin: Inferior surgical scars of bilateral breast from prior breast reduction surgery Breast: Right breast inferiorly and inferior laterally with tenderness to palpation, otherwise nontender, no masses or lumps, no skin changes, no nipple discharge or inversion, no axillary or supraclavicular lymphadenopathy Chest: Non-tender chest wall Back: Nontender MSK: Arms nontender, normal range of motion, no deformity   Assessment: Encounter Diagnoses  Name Primary?  . Breast tenderness in female Yes  . Screening for breast cancer   . History of bilateral breast reduction surgery    Plan: No obvious abnormality other than tender to palpation of right breast.   Advised OTC Ibuprofen 3 tablets TID for the next several days, supportive sports bra, warm compresses, relative rest.   If symptoms resolve, will go for mammogram.  If not improving within 1-2 weeks, let me know.

## 2014-10-31 ENCOUNTER — Encounter: Payer: Self-pay | Admitting: Medical

## 2014-10-31 ENCOUNTER — Telehealth: Payer: Self-pay | Admitting: Medical

## 2014-10-31 ENCOUNTER — Ambulatory Visit (INDEPENDENT_AMBULATORY_CARE_PROVIDER_SITE_OTHER): Payer: 59 | Admitting: Medical

## 2014-10-31 VITALS — BP 120/80 | HR 78 | Temp 97.9°F | Resp 16 | Wt 169.0 lb

## 2014-10-31 DIAGNOSIS — N644 Mastodynia: Secondary | ICD-10-CM

## 2014-10-31 DIAGNOSIS — Z9889 Other specified postprocedural states: Secondary | ICD-10-CM

## 2014-10-31 DIAGNOSIS — H5789 Other specified disorders of eye and adnexa: Secondary | ICD-10-CM

## 2014-10-31 DIAGNOSIS — H578 Other specified disorders of eye and adnexa: Secondary | ICD-10-CM

## 2014-10-31 MED ORDER — POLYMYXIN B-TRIMETHOPRIM 10000-0.1 UNIT/ML-% OP SOLN: 1.0000 [drp] | OPHTHALMIC | Status: DC

## 2014-10-31 NOTE — Telephone Encounter (Signed)
Set up for right breast diagnostic mammogram and ultrasound

## 2014-10-31 NOTE — Progress Notes (Signed)
Subjective: Here today for recurrence of right breast discomfort.  I saw her in August for same.   Since last visit got some better, but never completley felt back to normal.   Still having right breast discomfort, throbbing radiating pain. Hurts to lie on that side.  She denies redness, fever, lymph node swelling, weight loss, night sweats, nipple discharge, no specific nodule or lump. She does check her breasts monthly.  Last mammogram 2013. She has a history of bilateral breast reduction surgery.  No neck pain, back pain, abdominal pain, arm pain. No rash or redness.  No other aggravating or relieving factors.   Also c/o right eye issue.  Few weeks ago had felt sensation that something was in the eye.  In the last week feels worse, some crusting at times, some tearing/watery eyes.  Sore tho blink a week ago. No vision changes.  Using saline to flush out the eyes.  No other c/o.  Past Medical History  Diagnosis Date  . Hypertension   . Hyperlipidemia   . Allergy   . Supraspinatus tendonitis 2/12    Dr. Gladstone Lighter  . Biceps tendonitis 2/12    Dr. Gladstone Lighter  . History of mammogram 05/25/12    stable, no suspicious finding, extremely dense breast tissue  . S/P hysterectomy   . Neutropenia, unspecified 02/2013    slight, likely ethnicity related  . 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 Diabetes mellitus     type II  . Pneumococcal vaccine refused 03/2013  . Refused influenza vaccine 03/2013  . Wears glasses   . History of blood transfusion 1988    hysterectomy   Past Surgical History  Procedure Laterality Date  . Breast reduction surgery    . Exploratory laparotomy w/ pull-through  1980    due to bloody aspirate  . Abdominal hysterectomy  1990    and BSO; endometriosis  . Colonoscopy  03/06/12    sigmoid polyp; repeat 10 years; Dr. Tora Duck  . Anterior cervical decomp/discectomy fusion N/A 06/20/2013   Procedure: ANTERIOR CERVICAL DECOMPRESSION/DISCECTOMY FUSION 1 LEVEL;  Surgeon: Ophelia Charter, MD;  Location: [facility];  Service: Neurosurgery;  Laterality: N/A;  C45 anterior cervical decompression with fusion interbody prothesis plating and bonegraft  . Carpal tunnel release Right 06/20/2013    Procedure: CARPAL TUNNEL RELEASE;  Surgeon: Ophelia Charter, MD;  Location: [facility];  Service: Neurosurgery;  Laterality: Right;  RIGHT carpal tunnel release   ROS as in subjective  Objective: Gen: wd, wn, nad Skin: Inferior surgical scars of bilateral breast from prior breast reduction surgery Breast: Right breast inferiorly and inferior laterally with tenderness to palpation, otherwise nontender, no masses or lumps, no skin changes, no nipple discharge or inversion, no axillary or supraclavicular lymphadenopathy Chest: Non-tender chest wall Back: Nontender MSK: Arms nontender, normal range of motion, no deformity Eyes: lateral upper right conjunctiva with round small mass, 3 mm diameter suggestive of stye, otherwise normal coloration, PERRLA, EOMi, no other erythema crusting or drainage.    20/20 corrected vision bilat, and each eye individually corrected.   fluorescein staining with no ulcerations or other abnormal findings.     Assessment: Encounter Diagnoses  Name Primary?  . Pain of right breast Yes  . S/P bilateral breast reduction   . Irritation of right eye    Plan: Pain of breast.  No obvious abnormality other than tender to  palpation of right breast.   Referral for diagnostic mammogram  Irritation of eye - possible right eye stye formation, early.  Begin warm compresses, Polytrim drops.   If not completely better in a week, then recheck .

## 2014-11-03 LAB — HM MAMMOGRAPHY: HM Mammogram: NEGATIVE

## 2014-11-03 NOTE — Telephone Encounter (Signed)
Done

## 2014-11-06 ENCOUNTER — Telehealth: Payer: Self-pay | Admitting: Medical

## 2014-11-06 NOTE — Telephone Encounter (Signed)
Mammogram normal.   Given her ongoing pains, refer to gynecology for further evaluation if still having pain.  Not sure what to think her pains given the normal mammogram.

## 2014-11-07 ENCOUNTER — Other Ambulatory Visit: Payer: Self-pay | Admitting: Family Medicine

## 2014-11-07 DIAGNOSIS — N644 Mastodynia: Secondary | ICD-10-CM

## 2014-11-07 NOTE — Telephone Encounter (Signed)
What is her Dx for going to Gynecology?

## 2014-11-07 NOTE — Telephone Encounter (Signed)
Orders are in EPIC and they will contact the patient for her appointment

## 2014-11-07 NOTE — Telephone Encounter (Signed)
Breast pain

## 2014-11-13 ENCOUNTER — Encounter: Payer: Self-pay | Admitting: Medical

## 2014-11-14 ENCOUNTER — Encounter: Payer: Self-pay | Admitting: Internal Medicine

## 2014-11-19 ENCOUNTER — Telehealth: Payer: Self-pay | Admitting: Obstetrics & Gynecology

## 2014-11-19 NOTE — Telephone Encounter (Signed)
lmtcb to schedule internal dr referral with Dr. Sabra Heck. Offer 12/01/14.

## 2014-11-24 NOTE — Telephone Encounter (Signed)
lmtcb

## 2014-12-01 ENCOUNTER — Ambulatory Visit (INDEPENDENT_AMBULATORY_CARE_PROVIDER_SITE_OTHER): Payer: 59 | Admitting: Obstetrics & Gynecology

## 2014-12-01 ENCOUNTER — Encounter: Payer: Self-pay | Admitting: Obstetrics & Gynecology

## 2014-12-01 VITALS — BP 158/82 | HR 64 | Resp 16 | Ht 63.5 in | Wt 170.2 lb

## 2014-12-01 DIAGNOSIS — Z0000 Encounter for general adult medical examination without abnormal findings: Secondary | ICD-10-CM

## 2014-12-01 DIAGNOSIS — N644 Mastodynia: Secondary | ICD-10-CM

## 2014-12-01 LAB — POCT URINALYSIS DIPSTICK
Bilirubin, UA: NEGATIVE
Blood, UA: NEGATIVE
GLUCOSE UA: NEGATIVE
KETONES UA: NEGATIVE
Nitrite, UA: NEGATIVE
Protein, UA: NEGATIVE
Urobilinogen, UA: NEGATIVE
pH, UA: 6.5

## 2014-12-01 MED ORDER — BENZONATATE 100 MG PO CAPS: 100.0000 mg | ORAL_CAPSULE | Freq: Three times a day (TID) | ORAL | Status: DC | PRN

## 2014-12-01 NOTE — Progress Notes (Signed)
53 y.o. Bethany Webb here for consultation regarding breast pain.  Pt states it started in the summer.  PCP thougth this might be due to activity at summer [employer] camp.  Pt isn't really so sure this is true.  She does have pain that   Had breast reduction that wasn't nipple sparing.  Never had tatoos placed.  Went from [facility] to DD size.  This was in 1994.   Was on premarin for about a year after her hysterectomy.  Was taken off in about a year.  Came off when the WHI was published.  This has nothing to do with her visit today but she reports having a URI and is over all of the symptoms except she just keeps coughing.  Wants some suggestions.  Non productive.  No fever.      Patient's last menstrual period was 12/27/1987.          Sexually active: No.  The current method of family planning is status post hysterectomy.    Exercising: No.  not regularly Smoker:  no  Health Maintenance: Pap:  1989-prior to hysterectomy History of abnormal Pap:  no MMG:  11/03/14 3D Diag-normal Colonoscopy:  2011-repeat in 5 years BMD:   none TDaP:  04/20/06 Screening Labs: PCP, Hb today: PCP, Urine today: WBC-1+, PH-6.5   reports that she has never smoked. She has never used smokeless tobacco. She reports that she drinks alcohol. She reports that she does not use illicit drugs.  Past Medical History  Diagnosis Date  . Hypertension   . Hyperlipidemia   . Allergy   . Supraspinatus tendonitis 2/12    Dr. Gladstone Lighter  . Biceps tendonitis 2/12    Dr. Gladstone Lighter  . History of mammogram 05/25/12    stable, no suspicious finding, extremely dense breast tissue  . S/P hysterectomy   . Neutropenia, unspecified 02/2013    slight, likely ethnicity related  . 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 Diabetes mellitus     type II  . Pneumococcal vaccine refused 03/2013  . Refused influenza vaccine 03/2013  . Wears glasses   .  History of blood transfusion 1988    hysterectomy    Past Surgical History  Procedure Laterality Date  . Breast reduction surgery    . Exploratory laparotomy w/ pull-through  1980    due to bloody aspirate  . Abdominal hysterectomy  1990    and BSO; endometriosis  . Colonoscopy  03/06/12    sigmoid polyp; repeat 10 years; Dr. Tora Duck  . Anterior cervical decomp/discectomy fusion N/A 06/20/2013    Procedure: ANTERIOR CERVICAL DECOMPRESSION/DISCECTOMY FUSION 1 LEVEL;  Surgeon: Ophelia Charter, MD;  Location: [place] [facility];  Service: Neurosurgery;  Laterality: N/A;  C45 anterior cervical decompression with fusion interbody prothesis plating and bonegraft  . Carpal tunnel release Right 06/20/2013    Procedure: CARPAL TUNNEL RELEASE;  Surgeon: Ophelia Charter, MD;  Location: [place] [facility];  Service: Neurosurgery;  Laterality: Right;  RIGHT carpal tunnel release    Current Outpatient Prescriptions  Medication Sig Dispense Refill  . amLODipine (NORVASC) 10 MG tablet Take 1 tablet (10 mg total) by mouth daily. 90 tablet 3  . glipiZIDE (GLUCOTROL) 10 MG tablet Take 1 tablet (10 mg total) by mouth daily. 90 tablet 3  . hydrocortisone cream 1 % Apply 1 application topically 2 (two) times daily. As needed    .  metFORMIN (GLUCOPHAGE) 850 MG tablet Take 1 tablet (850 mg total) by mouth 2 (two) times daily with a meal. 180 tablet 3  . NON FORMULARY On [place] as needed for cold symptoms    . peppermint oil liquid by Does not apply route as needed.    . pravastatin (PRAVACHOL) 80 MG tablet Take 1 tablet (80 mg total) by mouth every evening. 90 tablet 3   No current facility-administered medications for this visit.    Family History  Problem Relation Age of Onset  . Cancer Paternal Grandmother     breast  . Diabetes Other     maternal and paternal grandmother  . Heart disease Father 73    died of MI, CABG   . Multiple sclerosis Sister   . Asthma Brother   . Cancer Maternal Uncle      hematologic  . Hypertension Maternal Grandmother   . Cancer Maternal Grandfather     prostate  . Hypertension Maternal Grandfather     ROS:  Pertinent items are noted in HPI.  Otherwise, a comprehensive ROS was negative.  Exam:   BP 158/82 mmHg  Pulse 64  Resp 16  Ht 5' 3.5" (1.613 m)  Wt 170 lb 3.2 oz (77.202 kg)  BMI 29.67 kg/m2  LMP 12/27/1987     Height: 5' 3.5" (161.3 cm)  Ht Readings from Last 3 Encounters:  12/01/14 5' 3.5" (1.613 m)  05/28/14 5\' 4"  (1.626 m)  06/21/13 5\' 4"  (1.626 m)    General appearance: alert, cooperative and appears stated age Head: Normocephalic, without obvious abnormality, atraumatic Neck: no adenopathy, supple, symmetrical, trachea midline and thyroid normal to inspection and palpation Lungs: clear to auscultation bilaterally Breasts: normal appearance, no masses or tenderness Heart: regular rate and rhythm Abdomen: soft, non-tender; bowel sounds normal; no masses,  no organomegaly Extremities: extremities normal, atraumatic, no cyanosis or edema Skin: Skin color, texture, turgor normal. No rashes or lesions Lymph nodes: Cervical, supraclavicular, and axillary nodes normal. No abnormal inguinal nodes palpated Neurologic: Grossly normal   Pelvic: not done  A:  Breast pain now for > 6 months.  Normal [facility]D MMG at [facility] that was [facility]D  Cough after URI  P:   Trial of Vit E 200 IU or 400 IU for at least four weeks. Pt highly encouraged to stop Caffeine F/U 2 months.  An After Visit Summary was printed and given to the patient.

## 2014-12-01 NOTE — Patient Instructions (Signed)
Vit E 200 IU x 2 weeks can increase to 400 IU.  Take daily.  Trial of caffeine cessation.

## 2014-12-01 NOTE — Addendum Note (Signed)
Addended by: Robley Fries on: 12/01/2014 01:06 PM   Modules accepted: Orders

## 2014-12-22 ENCOUNTER — Other Ambulatory Visit: Payer: Self-pay | Admitting: Medical

## 2015-01-30 ENCOUNTER — Encounter: Payer: Self-pay | Admitting: Obstetrics & Gynecology

## 2015-01-30 ENCOUNTER — Telehealth: Payer: Self-pay | Admitting: Obstetrics & Gynecology

## 2015-01-30 ENCOUNTER — Ambulatory Visit: Payer: 59 | Admitting: Obstetrics & Gynecology

## 2015-01-30 NOTE — Telephone Encounter (Signed)
I called and left patient a message about her missed appointment and she called right back. She took full responsibility for missing her appointment and apologised for the doctor's lost time. She rescheduled for 02/27/15.

## 2015-01-30 NOTE — Telephone Encounter (Signed)
Thank you.  If letter hasn't gone out, don't send it.  Thanks.

## 2015-02-02 ENCOUNTER — Encounter: Payer: Self-pay | Admitting: Obstetrics & Gynecology

## 2015-02-04 ENCOUNTER — Ambulatory Visit (INDEPENDENT_AMBULATORY_CARE_PROVIDER_SITE_OTHER): Payer: 59 | Admitting: Medical

## 2015-02-04 ENCOUNTER — Encounter: Payer: Self-pay | Admitting: Medical

## 2015-02-04 ENCOUNTER — Telehealth: Payer: Self-pay | Admitting: Medical

## 2015-02-04 VITALS — BP 110/80 | HR 86 | Temp 97.9°F | Resp 14 | Wt 167.0 lb

## 2015-02-04 DIAGNOSIS — E785 Hyperlipidemia, unspecified: Secondary | ICD-10-CM

## 2015-02-04 DIAGNOSIS — I10 Essential (primary) hypertension: Secondary | ICD-10-CM

## 2015-02-04 DIAGNOSIS — E119 Type 2 diabetes mellitus without complications: Secondary | ICD-10-CM

## 2015-02-04 DIAGNOSIS — L720 Epidermal cyst: Secondary | ICD-10-CM

## 2015-02-04 NOTE — Telephone Encounter (Signed)
Refer to [facility] for recurrent epidermoid cyst of right scalp.  We cut one out in same spot 2013.

## 2015-02-04 NOTE — Progress Notes (Signed)
                                                             Subjective:  Bethany Webb is a 54 y.o. female.  She is mainly here today for cyst of scalp. We removed similar cyst in 2013 in the same area per her report.  She is frustrated that it has returned. After reviewing chart it appears that she has not been here in over a year for routine diabetes follow-up. 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 She is up-to-date with eye doctor visit just a few months ago, checks feet daily.  No other aggravating or relieving factors.    No other c/o.  The following portions of the patient's history were reviewed and updated as appropriate: allergies, current medications, past family history, past medical history, past social history, past surgical history and problem list.  ROS Otherwise as in subjective above  Objective: Physical Exam BP 110/80 mmHg  Pulse 86  Temp(Src) 97.9 F (36.6 C) (Oral)  Resp 14  Wt 167 lb (75.751 kg)  LMP 12/27/1987  General appearance: alert, no distress, WD/WN Skin: right scalp superior posterior parietal region with 28mm x 41mm round raised, mobile nontender lesion suggestive of epidermoid cyst Neck: supple, no lymphadenopathy, no thyromegaly, no masses Heart: RRR, normal S1, S2, no murmurs Lungs: CTA bilaterally, no wheezes, rhonchi, or rales Pulses: 2+ radial pulses, 2+ pedal pulses, normal cap refill Ext: no edema   Assessment: Encounter Diagnoses  Name Primary?  . Epidermoid cyst Yes  . Diabetes type 2, controlled   . Essential hypertension   . Hyperlipidemia     Plan: Epidermoid cyst - after discussing the findings, options for therapy, she elects to see dermatology we will make referral She is compliant with her medications for chronic issues including diabetes blood pressure and hyperlipidemia, however she is not  compliant with exercise and possibly not so compliant with diet. She is up-to-date on eye exam, checks feet daily, she'll return for fasting labs

## 2015-02-04 NOTE — Addendum Note (Signed)
Addended by: Carlena Hurl on: 02/04/2015 09:53 AM   Modules accepted: Orders

## 2015-02-04 NOTE — Telephone Encounter (Signed)
Patient is aware of her appointment with Nelda Severe at Dr. Ledell Peoples office on 02/25/15 @ 1030 am [phone]

## 2015-02-09 LAB — HM DIABETES EYE EXAM

## 2015-02-25 NOTE — Telephone Encounter (Signed)
Letter was voided. Okay to close encounter?

## 2015-02-25 NOTE — Telephone Encounter (Signed)
Encounter closed.  Thanks.

## 2015-02-27 ENCOUNTER — Encounter: Payer: Self-pay | Admitting: Obstetrics & Gynecology

## 2015-02-27 ENCOUNTER — Ambulatory Visit (INDEPENDENT_AMBULATORY_CARE_PROVIDER_SITE_OTHER): Payer: 59 | Admitting: Obstetrics & Gynecology

## 2015-02-27 VITALS — BP 122/64 | HR 68 | Resp 16 | Wt 170.8 lb

## 2015-02-27 DIAGNOSIS — N644 Mastodynia: Secondary | ICD-10-CM

## 2015-02-27 NOTE — Progress Notes (Signed)
Subjective:     Patient ID: Bethany Webb, female   DOB: 1961-09-13, 54 y.o.   MRN: [id number]  HPI 54 yo G1P1 SAAF here for follow up of breast pain.  Pt did decrease caffeine and states this really made a difference.  She was given a 6-pack of regular Pepsi recently and had breast pain all day after drinking one Pepsi.  She knows now she can control the pain with choices she makes.  She never tried the Vit D but knows she can take this, as well, if breast pain restarts.  MMG 11/15.    H/O TAH/BSO.  Not on HRT.  Review of Systems  All other systems reviewed and are negative.      Objective:   Physical Exam  Constitutional: She appears well-developed and well-nourished.  Pulmonary/Chest: Right breast exhibits no inverted nipple, no mass, no nipple discharge, no skin change and no tenderness. Left breast exhibits no inverted nipple, no mass, no nipple discharge, no skin change and no tenderness. Breasts are symmetrical.  S/p breast reduction bilaterally.       Assessment:     Breast pain, much improved with decreased caffeine     Plan:     Pt has had a complete hysterectomy so pap smears not indicated.  Last MMG 11/15.  Pt is welcome to return for any new gynecologic issues but I am returning her to her PCP at this time.

## 2015-04-09 ENCOUNTER — Encounter: Payer: Self-pay | Admitting: Medical

## 2015-04-09 ENCOUNTER — Ambulatory Visit (INDEPENDENT_AMBULATORY_CARE_PROVIDER_SITE_OTHER): Payer: 59 | Admitting: Medical

## 2015-04-09 VITALS — BP 114/78 | HR 82 | Temp 98.2°F | Resp 15 | Wt 170.0 lb

## 2015-04-09 DIAGNOSIS — H9201 Otalgia, right ear: Secondary | ICD-10-CM

## 2015-04-09 DIAGNOSIS — H579 Unspecified disorder of eye and adnexa: Secondary | ICD-10-CM | POA: Diagnosis not present

## 2015-04-09 DIAGNOSIS — J309 Allergic rhinitis, unspecified: Principal | ICD-10-CM

## 2015-04-09 DIAGNOSIS — H1010 Acute atopic conjunctivitis, unspecified eye: Secondary | ICD-10-CM | POA: Diagnosis not present

## 2015-04-09 MED ORDER — BECLOMETHASONE DIPROPIONATE 80 MCG/ACT NA AERS: 1.0000 | INHALATION_SPRAY | Freq: Two times a day (BID) | NASAL | Status: DC

## 2015-04-09 NOTE — Progress Notes (Signed)
Subjective: Chief Complaint  Patient presents with  . eye pressure    Dr Idolina Primer has seen patient has provided eye drops ..steroids and allergy drops, neither of these drops have helped much, eye pressure still persists   She notes pressure behind right eye, possible left eye, since 10/2014, intermittent.  At times has had ear discomfort on the right.   Went to see her eye doctor recently twice for this.  No glaucoma, no specific abnormality.  Was tried on steroid drops which didn't help.  Was given script for allergy drops and hasn't started this yet.  She does have allergy symptoms, runny nose, congestion, itchy nose and eyes, sneezing.   Using nothing currently for this.   Denies any other facial pain, no changes in sense of smell or taste, no paresthesias of face, no hearing loss, no vision loss, no crusting or drainage of the eye, no scalp pain, no rash, no sinus pressure, no headaches.   No other aggravating or relieving factors. No other complaint.  ROS as in subjective  Past Medical History  Diagnosis Date  . Hypertension   . Hyperlipidemia   . Allergy   . Supraspinatus tendonitis 2/12    Dr. Gladstone Lighter  . Biceps tendonitis 2/12    Dr. Gladstone Lighter  . History of mammogram 05/25/12    stable, no suspicious finding, extremely dense breast tissue  . S/P hysterectomy   . Neutropenia, unspecified 02/2013    slight, likely ethnicity related  . 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 Diabetes mellitus     type II  . Pneumococcal vaccine refused 03/2013  . Refused influenza vaccine 03/2013  . Wears glasses   . History of blood transfusion 1988    hysterectomy   Objective BP 114/78 mmHg  Pulse 82  Temp(Src) 98.2 F (36.8 C) (Oral)  Resp 15  Wt 170 lb (77.111 kg)  LMP 12/27/1987  General appearance: alert, no distress, WD/WN HEENT: normocephalic, PERRLA, EOMi, no discharge, abnormality, no crusting, no stye,  sclerae anicteric, TMs pearly, nares with turbinate edema, clear discharge, no erythema, pharynx normal, no facial deformity or tenderness Oral cavity: MMM, no lesions Neck: supple, no lymphadenopathy, no thyromegaly, no masses Heart: RRR, normal S1, S2, no murmurs Lungs: CTA bilaterally, no wheezes, rhonchi, or rales   Assessment: Encounter Diagnoses  Name Primary?  . Allergic conjunctivitis and rhinitis, unspecified laterality Yes  . Eye pressure   . Ear discomfort, right     Plan: Etiology unclear.  Possibly related to sinusitis or allergy symptoms.    Begin Qnasal nasal spray, OTC antihistamine QHS, allergy eye drops per eye doctor, increase water intake, and if not much improved in 7-10 days, then consider antibiotic for possible sinusitis.  If not improving in next few weeks, consider head CT.  She will call back in 1-2 weeks to update me on symptoms

## 2015-04-15 ENCOUNTER — Other Ambulatory Visit: Payer: Self-pay | Admitting: Medical

## 2015-04-25 ENCOUNTER — Telehealth: Payer: Self-pay | Admitting: Medical

## 2015-04-28 NOTE — Telephone Encounter (Signed)
P.A. Isabelle Course, pt needs trial of Flunisolide, Nasacort OTC or Zetonna. Do you want to switch?

## 2015-04-29 NOTE — Telephone Encounter (Signed)
She has failed/used fluticasone in the past already, both OTC and prescription

## 2015-04-30 ENCOUNTER — Other Ambulatory Visit: Payer: Self-pay | Admitting: Medical

## 2015-04-30 ENCOUNTER — Telehealth: Payer: Self-pay | Admitting: Internal Medicine

## 2015-04-30 ENCOUNTER — Encounter: Payer: Self-pay | Admitting: Medical

## 2015-04-30 ENCOUNTER — Ambulatory Visit (INDEPENDENT_AMBULATORY_CARE_PROVIDER_SITE_OTHER): Payer: 59 | Admitting: Medical

## 2015-04-30 VITALS — BP 110/72 | HR 86 | Temp 98.2°F | Resp 15 | Wt 171.0 lb

## 2015-04-30 DIAGNOSIS — J3489 Other specified disorders of nose and nasal sinuses: Secondary | ICD-10-CM

## 2015-04-30 DIAGNOSIS — E119 Type 2 diabetes mellitus without complications: Secondary | ICD-10-CM

## 2015-04-30 DIAGNOSIS — I10 Essential (primary) hypertension: Secondary | ICD-10-CM

## 2015-04-30 DIAGNOSIS — H579 Unspecified disorder of eye and adnexa: Secondary | ICD-10-CM | POA: Diagnosis not present

## 2015-04-30 DIAGNOSIS — E785 Hyperlipidemia, unspecified: Secondary | ICD-10-CM

## 2015-04-30 MED ORDER — METHYLPREDNISOLONE 4 MG PO TABS: ORAL_TABLET | ORAL | Status: DC

## 2015-04-30 MED ORDER — AMOXICILLIN-POT CLAVULANATE 875-125 MG PO TABS: 1.0000 | ORAL_TABLET | Freq: Two times a day (BID) | ORAL | Status: DC

## 2015-04-30 MED ORDER — TRIAMCINOLONE ACETONIDE 55 MCG/ACT NA AERO: 2.0000 | INHALATION_SPRAY | Freq: Every day | NASAL | Status: DC

## 2015-04-30 NOTE — Telephone Encounter (Signed)
i sent nasacort

## 2015-04-30 NOTE — Telephone Encounter (Signed)
Yes but that is not in their list of  covered alternatives, she has to have trial of one of those

## 2015-04-30 NOTE — Progress Notes (Signed)
Subjective: Chief Complaint  Patient presents with  . eye pressure    persistent eye pressure, shooting pains not resolved    She notes pressure behind right eye, possible left eye, since 10/2014, intermittent.  Not improved since last visit.  Has been using Qnasal with some improvement.  At times has had ear discomfort on the right.   Went to see her eye doctor recently twice for this.  No glaucoma, no specific abnormality.  Was tried on steroid drops which didn't help.  Was given script for allergy drops and hasn't started this yet.  She does have allergy symptoms, runny nose, congestion, itchy nose and eyes, sneezing.   Using nothing currently for this.   Denies any other facial pain, no changes in sense of smell or taste, no paresthesias of face, no hearing loss, no vision loss, no crusting or drainage of the eye, no scalp pain, no rash, no sinus pressure, no headaches.   No other aggravating or relieving factors. No other complaint.  ROS as in subjective  Past Medical History  Diagnosis Date  . Hypertension   . Hyperlipidemia   . Allergy   . Supraspinatus tendonitis 2/12    Dr. Gladstone Lighter  . Biceps tendonitis 2/12    Dr. Gladstone Lighter  . History of mammogram 05/25/12    stable, no suspicious finding, extremely dense breast tissue  . S/P hysterectomy   . Neutropenia, unspecified 02/2013    slight, likely ethnicity related  . 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 Diabetes mellitus     type II  . Pneumococcal vaccine refused 03/2013  . Refused influenza vaccine 03/2013  . Wears glasses   . History of blood transfusion 1988    hysterectomy   Objective BP 110/72 mmHg  Pulse 86  Temp(Src) 98.2 F (36.8 C) (Oral)  Resp 15  Wt 171 lb (77.565 kg)  LMP 12/27/1987  General appearance: alert, no distress, WD/WN HEENT: normocephalic, PERRLA, EOMi, no discharge, abnormality, no crusting, no stye, sclerae anicteric,  TMs pearly, nares with turbinate edema, clear discharge, no erythema, pharynx normal, no facial deformity or tenderness Oral cavity: MMM, no lesions Neck: supple, no lymphadenopathy, no thyromegaly, no masses Heart: RRR, normal S1, S2, no murmurs Lungs: CTA bilaterally, no wheezes, rhonchi, or rales   Assessment: Encounter Diagnoses  Name Primary?  . Eye pressure Yes  . Sinus pain     Plan: Bethany Webb was seen today for eye pressure.  Diagnoses and all orders for this visit:  Eye pressure  Sinus pain  Other orders -     amoxicillin-clavulanate (AUGMENTIN) 875-125 MG per tablet; Take 1 tablet by mouth 2 (two) times daily. -     methylPREDNISolone (MEDROL) 4 MG tablet; 6/5/4/3/2/1   If not resolving with this regimen, then consider CT head.  She will return for fasting labs tomorrow from the 01/2015 visit.

## 2015-04-30 NOTE — Telephone Encounter (Signed)
Pt states she is coming in the morning for lab work but no orders are in the system. PLEASE PUT FUTURE ORDERS IN AS LAB COLLECT to be released tomorrow when pt comes in

## 2015-05-01 ENCOUNTER — Other Ambulatory Visit: Payer: 59

## 2015-05-01 DIAGNOSIS — E119 Type 2 diabetes mellitus without complications: Secondary | ICD-10-CM

## 2015-05-01 DIAGNOSIS — I10 Essential (primary) hypertension: Secondary | ICD-10-CM

## 2015-05-01 DIAGNOSIS — E785 Hyperlipidemia, unspecified: Secondary | ICD-10-CM

## 2015-05-01 LAB — CBC WITH DIFFERENTIAL/PLATELET
BASOS ABS: 0 10*3/uL (ref 0.0–0.1)
Basophils Relative: 0 % (ref 0–1)
EOS ABS: 0.1 10*3/uL (ref 0.0–0.7)
Eosinophils Relative: 2 % (ref 0–5)
HEMATOCRIT: 38.6 % (ref 36.0–46.0)
Hemoglobin: 12.8 g/dL (ref 12.0–15.0)
Lymphocytes Relative: 46 % (ref 12–46)
Lymphs Abs: 1.8 10*3/uL (ref 0.7–4.0)
MCH: 28.8 pg (ref 26.0–34.0)
MCHC: 33.2 g/dL (ref 30.0–36.0)
MCV: 86.7 fL (ref 78.0–100.0)
MPV: 9.8 fL (ref 8.6–12.4)
Monocytes Absolute: 0.3 10*3/uL (ref 0.1–1.0)
Monocytes Relative: 8 % (ref 3–12)
NEUTROS ABS: 1.8 10*3/uL (ref 1.7–7.7)
NEUTROS PCT: 44 % (ref 43–77)
PLATELETS: 397 10*3/uL (ref 150–400)
RBC: 4.45 MIL/uL (ref 3.87–5.11)
RDW: 14.2 % (ref 11.5–15.5)
WBC: 4 10*3/uL (ref 4.0–10.5)

## 2015-05-01 LAB — COMPREHENSIVE METABOLIC PANEL
ALBUMIN: 4.5 g/dL (ref 3.5–5.2)
ALK PHOS: 71 U/L (ref 39–117)
ALT: 31 U/L (ref 0–35)
AST: 32 U/L (ref 0–37)
BUN: 11 mg/dL (ref 6–23)
CO2: 29 mEq/L (ref 19–32)
Calcium: 9.2 mg/dL (ref 8.4–10.5)
Chloride: 100 mEq/L (ref 96–112)
Creat: 0.85 mg/dL (ref 0.50–1.10)
GLUCOSE: 176 mg/dL — AB (ref 70–99)
Potassium: 3.8 mEq/L (ref 3.5–5.3)
SODIUM: 138 meq/L (ref 135–145)
Total Bilirubin: 0.7 mg/dL (ref 0.2–1.2)
Total Protein: 7.5 g/dL (ref 6.0–8.3)

## 2015-05-01 LAB — LIPID PANEL
Cholesterol: 172 mg/dL (ref 0–200)
HDL: 56 mg/dL (ref >=46)
LDL CALC: 104 mg/dL — AB (ref 0–99)
TRIGLYCERIDES: 60 mg/dL (ref <150)
Total CHOL/HDL Ratio: 3.1 Ratio
VLDL: 12 mg/dL (ref 0–40)

## 2015-05-01 LAB — HEMOGLOBIN A1C
Hgb A1c MFr Bld: 7.6 % — ABNORMAL HIGH (ref <5.7)
MEAN PLASMA GLUCOSE: 171 mg/dL — AB (ref <117)

## 2015-05-02 LAB — MICROALBUMIN / CREATININE URINE RATIO
CREATININE, URINE: 100.1 mg/dL
MICROALB/CREAT RATIO: 3 mg/g (ref 0.0–30.0)
Microalb, Ur: 0.3 mg/dL (ref <2.0)

## 2015-05-05 NOTE — Telephone Encounter (Signed)
Pt switched to Nasacort

## 2015-05-07 ENCOUNTER — Other Ambulatory Visit: Payer: Self-pay | Admitting: Medical

## 2015-05-07 MED ORDER — AMLODIPINE BESYLATE 10 MG PO TABS: 10.0000 mg | ORAL_TABLET | Freq: Every day | ORAL | Status: DC

## 2015-05-07 MED ORDER — DAPAGLIFLOZIN PRO-METFORMIN ER 10-1000 MG PO TB24: 1.0000 | ORAL_TABLET | Freq: Every day | ORAL | Status: DC

## 2015-05-07 MED ORDER — LISINOPRIL-HYDROCHLOROTHIAZIDE 20-25 MG PO TABS: 1.0000 | ORAL_TABLET | Freq: Every day | ORAL | Status: DC

## 2015-05-07 MED ORDER — PRAVASTATIN SODIUM 80 MG PO TABS: 80.0000 mg | ORAL_TABLET | Freq: Every evening | ORAL | Status: DC

## 2015-05-09 ENCOUNTER — Telehealth: Payer: Self-pay | Admitting: Medical

## 2015-05-15 NOTE — Telephone Encounter (Signed)
P.A. XIGDUO denied, pt needs trial of Invokamet, do you want to switch?

## 2015-05-15 NOTE — Telephone Encounter (Signed)
Was coupon card activated and tried?

## 2015-05-19 NOTE — Telephone Encounter (Signed)
Activated discount card & called pharmacy & went thru for $0 & they will order & it will be in tomorrow.  Called pt & left message

## 2015-05-26 ENCOUNTER — Telehealth: Payer: Self-pay | Admitting: Medical

## 2015-05-26 NOTE — Telephone Encounter (Signed)
Advised pt of Xigduo approval thru 3rd party discount card and she is out of town but will have someone pick it up for her.  She wants to know if she continues her other diabetes meds since she already has 90 days worth or does she stop.  Please call her and let her know.

## 2015-05-26 NOTE — Telephone Encounter (Signed)
Finish out the medications she was on already, then switch to [employer]

## 2015-05-27 NOTE — Telephone Encounter (Signed)
Patient is aware of Dorothea Ogle PA message about the medication and when to start

## 2015-06-24 ENCOUNTER — Telehealth: Payer: Self-pay | Admitting: Medical

## 2015-06-24 ENCOUNTER — Ambulatory Visit (INDEPENDENT_AMBULATORY_CARE_PROVIDER_SITE_OTHER): Payer: Commercial Managed Care - HMO | Admitting: Medical

## 2015-06-24 ENCOUNTER — Encounter: Payer: Self-pay | Admitting: Medical

## 2015-06-24 VITALS — BP 138/82 | HR 64 | Temp 98.0°F | Wt 168.4 lb

## 2015-06-24 DIAGNOSIS — H5711 Ocular pain, right eye: Secondary | ICD-10-CM | POA: Diagnosis not present

## 2015-06-24 DIAGNOSIS — H1013 Acute atopic conjunctivitis, bilateral: Secondary | ICD-10-CM | POA: Diagnosis not present

## 2015-06-24 DIAGNOSIS — R51 Headache: Secondary | ICD-10-CM

## 2015-06-24 DIAGNOSIS — R519 Headache, unspecified: Secondary | ICD-10-CM

## 2015-06-24 NOTE — Telephone Encounter (Signed)
Patient wants to know why is Dr. Idolina Primer giving the patient a Rx for a eye drop when they don't know what is wrong with her eye?  Patient states and she has states before she does not want to take any more daily medications. NO MORE DAILY MEDS.  Patient is aware of Dorothea Ogle PA message in detail.

## 2015-06-24 NOTE — Progress Notes (Signed)
Subjective: Chief Complaint  Patient presents with  . pain in eye    pain in eye, eyelid, itching and thrombing back to ear and shooting pain in eyelids. went to Dr. Idolina Primer for her eye and got on medicine and steriods. she might think it might be allergies   Her for recheck on eye issue, facial and eye pain .  Still notes throbbing pain, pain in eyelids, irritated and itching eyes, watery eyes, feels like something always in the eye.   I have seen her a few times for the same.   She notes pressure behind right eye, possible left eye, since 10/2014, intermittent.  Not improved since last visit.   Went to see her eye doctor earlier in the year for this.  No glaucoma, no specific abnormality.  Was tried on steroid drops which helped as long as she was on the drops.  Has also used samples of Pazeo eye drops which helped too.  Takes zyrtec from time to time.   Denies any other allergy symptoms currently, no runny nose, congestion, itchy nose and eyes, sneezing.  She does report getting achy pains in right face from ear and forehead, some times sharp, not stabbing, but most of the time achy or throbbing facial pain.   Denies changes in sense of smell or taste, no paresthesias of face, no hearing loss, no vision loss, no crusting or drainage of the eye, no scalp pain, no rash, no sinus pressure, no headaches.   No other aggravating or relieving factors. No other complaint.  ROS as in subjective  Past Medical History  Diagnosis Date  . Hypertension   . Hyperlipidemia   . Allergy   . Supraspinatus tendonitis 2/12    Dr. Gladstone Lighter  . Biceps tendonitis 2/12    Dr. Gladstone Lighter  . History of mammogram 05/25/12    stable, no suspicious finding, extremely dense breast tissue  . S/P hysterectomy   . Neutropenia, unspecified 02/2013    slight, likely ethnicity related  . 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 Diabetes mellitus    type II  . Pneumococcal vaccine refused 03/2013  . Refused influenza vaccine 03/2013  . Wears glasses   . History of blood transfusion 1988    hysterectomy   Objective BP 138/82 mmHg  Pulse 64  Temp(Src) 98 F (36.7 C) (Oral)  Wt 168 lb 6.4 oz (76.386 kg)  LMP 12/27/1987  General appearance: alert, no distress, WD/WN HEENT: normocephalic, PERRLA, EOMi, no discharge, abnormality, no crusting, no stye, sclerae anicteric, TMs pearly, nares with turbinate edema, clear discharge, no erythema, pharynx normal, no facial deformity or tenderness Oral cavity: MMM, no lesions Neck: supple, no lymphadenopathy, no thyromegaly, no masses Heart: RRR, normal S1, S2, no murmurs Lungs: CTA bilaterally, no wheezes, rhonchi, or rales   Assessment: Encounter Diagnoses  Name Primary?  . Eye pain, right Yes  . Facial pain   . Allergic conjunctivitis, bilateral       Plan: Etiology - allergic conjunctivitis vs other eye condition vs trigeminal neuralgia.   I called and spoke to Dr. Trish Fountain office about her last visit there, only diagnosis was allergic conjunctivitis. Was prescribed Pazeo eye drops. Neither eye doctor is in today, so we will expect call back tomorrow.    For now will have her get the prescription of Pazeo given by her eye doctor.   Consider treatment for trigeminal neuralgia.  F/u pending call back.     Of note, Dr. Trish Fountain office is [phone]   Bethany Webb was seen today for pain in eye.  Diagnoses and all orders for this visit:  Eye pain, right  Facial pain  Allergic conjunctivitis, bilateral

## 2015-06-24 NOTE — Telephone Encounter (Signed)
1-Since we (Dr. Trish Fountain office and I) agree she has allergic conjunctivitis symptoms, it won't hurt to do the daily eye drop the she should have script for already.  If not I can resend .  This won't hurt anything, but should help resolve her symptoms if its truly allergic conjunctivitis  2 - I expect to hear back from Dr. Trish Fountain office tomorrow to discuss if there is any thought of iritis or uveitis or episcleritis which would require different drops, particular since she has a normal appearing exam  3 - she can certainly uses Theratears or lubricating eye drops as needed, but just not at the exact same time as the allergy eye drops  4- finally, since she has a NORMAL exam, with not only eye allergy symptoms but also PAIN, I am wondering about trigeminal neuralgia as a diagnosis.  This is typically a clinical diagnosis, although we can pursue head imaging to help further evaluate.  She doesn't have a clear cut diagnosis or we would have figured this out by now.  So if she doesn't want to pursue daily treatment for possibly trigeminal neuralgia, then start the allergy eye drop, and let me get back with her after speaking with Dr. Idolina Primer

## 2015-06-24 NOTE — Telephone Encounter (Signed)
Call her back.  Her eye doctor is not in til tomorrow, and I left message for them to call back.  Have her go get the prescription that Dr. Idolina Primer sent, Pazeo allergy eye drops.      The other consider for now is to start medication for possible trigeminal neuralgia.    another option would be as needed use of Bacolfen which may help but could also cause drowsiness, but the other option is daily medication such as Tegretol  Get her thoughts

## 2015-06-25 NOTE — Telephone Encounter (Signed)
I had called and talked with Dr. Idolina Primer.  He will see her back to rule out other eye pain causes.  He was not aware of her eye pain, only allergy symptoms.

## 2015-06-25 NOTE — Telephone Encounter (Signed)
Patient is seeing Dr Idolina Primer tomorrow at 145, she is aware of all.

## 2015-09-02 ENCOUNTER — Other Ambulatory Visit: Payer: Self-pay | Admitting: Medical

## 2015-09-14 ENCOUNTER — Telehealth: Payer: Self-pay | Admitting: Medical

## 2015-09-14 MED ORDER — GLUCOSE BLOOD VI STRP: ORAL_STRIP | Status: DC

## 2015-09-14 MED ORDER — LANCETS 30G MISC: Status: AC

## 2015-09-14 NOTE — Telephone Encounter (Signed)
Pt called and was wanting to see if you could send her a refill for test strips pt uses [facility] at [address], pt can be reached at [phone]

## 2015-09-14 NOTE — Telephone Encounter (Signed)
Done.. Pt test twice daily

## 2015-11-13 ENCOUNTER — Encounter: Payer: Self-pay | Admitting: Medical

## 2015-11-13 ENCOUNTER — Ambulatory Visit (INDEPENDENT_AMBULATORY_CARE_PROVIDER_SITE_OTHER): Payer: BLUE CROSS/BLUE SHIELD | Admitting: Medical

## 2015-11-13 VITALS — BP 130/80 | HR 101 | Wt 161.0 lb

## 2015-11-13 DIAGNOSIS — L989 Disorder of the skin and subcutaneous tissue, unspecified: Secondary | ICD-10-CM

## 2015-11-13 NOTE — Progress Notes (Signed)
Subjective: Chief Complaint  Patient presents with  . cyst    said 3rd time. said she is having eye problems but not sure if they are connected.    Here for scalp concern.   She has been seen by me and dermatology prior for epidermoid cyst of right superolateral scalp.  In the past month she now has a new hard round growth that is getting bigger in the same area as the prior cyst, thinks the cyst has returned.   No other aggravating or relieving factors. No other complaint.  Objective: BP 130/80 mmHg  Pulse 101  Wt 161 lb (73.029 kg)  LMP 12/27/1987  Gen: wd, wn, nad Right superolateral scalp with 80mm round hard raised lesion in the same area as prior linear surgical scar from prior epidermoid cyst.  Nontender, no obvious cyst, or mobile mass underneath.    Assessment Encounter Diagnosis  Name Primary?  . Skin lesion of scalp Yes    Plan: discussed possibly etiologies such as fibroma, granuloma, SCC, BCC, or other.  Discussed options for biopsy, and she wants to refer back to dermatology for excisional biopsy/eval and management.  Referral made.

## 2015-11-26 ENCOUNTER — Encounter: Payer: Self-pay | Admitting: Medical

## 2015-11-26 ENCOUNTER — Ambulatory Visit (INDEPENDENT_AMBULATORY_CARE_PROVIDER_SITE_OTHER): Payer: BLUE CROSS/BLUE SHIELD | Admitting: Medical

## 2015-11-26 ENCOUNTER — Other Ambulatory Visit: Payer: Self-pay | Admitting: Medical

## 2015-11-26 VITALS — BP 118/80 | HR 106 | Wt 154.0 lb

## 2015-11-26 DIAGNOSIS — M79671 Pain in right foot: Secondary | ICD-10-CM

## 2015-11-26 DIAGNOSIS — R748 Abnormal levels of other serum enzymes: Secondary | ICD-10-CM

## 2015-11-26 DIAGNOSIS — R52 Pain, unspecified: Secondary | ICD-10-CM

## 2015-11-26 DIAGNOSIS — I10 Essential (primary) hypertension: Secondary | ICD-10-CM | POA: Diagnosis not present

## 2015-11-26 DIAGNOSIS — E118 Type 2 diabetes mellitus with unspecified complications: Secondary | ICD-10-CM | POA: Diagnosis not present

## 2015-11-26 DIAGNOSIS — R772 Abnormality of alphafetoprotein: Secondary | ICD-10-CM

## 2015-11-26 DIAGNOSIS — M79672 Pain in left foot: Secondary | ICD-10-CM | POA: Diagnosis not present

## 2015-11-26 DIAGNOSIS — R7989 Other specified abnormal findings of blood chemistry: Secondary | ICD-10-CM

## 2015-11-26 DIAGNOSIS — R945 Abnormal results of liver function studies: Principal | ICD-10-CM

## 2015-11-26 LAB — CBC
HEMATOCRIT: 39.8 % (ref 36.0–46.0)
Hemoglobin: 12.9 g/dL (ref 12.0–15.0)
MCH: 27.4 pg (ref 26.0–34.0)
MCHC: 32.4 g/dL (ref 30.0–36.0)
MCV: 84.7 fL (ref 78.0–100.0)
MPV: 10.3 fL (ref 8.6–12.4)
Platelets: 390 10*3/uL (ref 150–400)
RBC: 4.7 MIL/uL (ref 3.87–5.11)
RDW: 14 % (ref 11.5–15.5)
WBC: 3.4 10*3/uL — AB (ref 4.0–10.5)

## 2015-11-26 LAB — URIC ACID: URIC ACID, SERUM: 4.9 mg/dL (ref 2.4–7.0)

## 2015-11-26 LAB — COMPREHENSIVE METABOLIC PANEL
ALBUMIN: 3.9 g/dL (ref 3.6–5.1)
ALT: 69 U/L — ABNORMAL HIGH (ref 6–29)
AST: 69 U/L — ABNORMAL HIGH (ref 10–35)
Alkaline Phosphatase: 370 U/L — ABNORMAL HIGH (ref 33–130)
BUN: 18 mg/dL (ref 7–25)
CALCIUM: 9.3 mg/dL (ref 8.6–10.4)
CHLORIDE: 96 mmol/L — AB (ref 98–110)
CO2: 27 mmol/L (ref 20–31)
CREATININE: 0.94 mg/dL (ref 0.50–1.05)
Glucose, Bld: 161 mg/dL — ABNORMAL HIGH (ref 65–99)
POTASSIUM: 4 mmol/L (ref 3.5–5.3)
SODIUM: 136 mmol/L (ref 135–146)
TOTAL PROTEIN: 7.3 g/dL (ref 6.1–8.1)
Total Bilirubin: 1.8 mg/dL — ABNORMAL HIGH (ref 0.2–1.2)

## 2015-11-26 MED ORDER — TRAMADOL HCL 50 MG PO TABS: 50.0000 mg | ORAL_TABLET | Freq: Three times a day (TID) | ORAL | Status: DC | PRN

## 2015-11-26 MED ORDER — IBUPROFEN 800 MG PO TABS: 800.0000 mg | ORAL_TABLET | Freq: Three times a day (TID) | ORAL | Status: DC | PRN

## 2015-11-26 NOTE — Progress Notes (Signed)
Subjective Chief Complaint  Patient presents with  . gout?    both ankles are extremly tender. said that they are burning, painful to touch and she has trouble walking. bottom of feet red. said left leg is worse.    Here for possible gout. No prior hx/o gout.  She does have hx/o diabetes, hypertension, hyperlipidemia with ok control.  She isn't checking glucose.   She is compliant with medications.  A week ago started getting pain in left lower leg, ankle and foot that gradually worsened, then started getting similar in right foot, but to a lesser degree.  Pain is throbbing at times, burning at times.   No pain in heel or bottom of feet, but mostly ankles and tops of feet. No swelling.  No paresthesias.  No warmth but felt like feet were reddish at times.   No calve pain, no knee or other leg pain.  No recent fall, trauma or injury.   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 She does not drink alcohol, diet has been ok, not strict diabetic diet.  Used ibuprofen OTC a few times in recent days.  No other aggravating or relieving factors. No other complaint.  ROS as in subjective   Past Medical History  Diagnosis Date  . Hypertension   . Hyperlipidemia   . Allergy   . Supraspinatus tendonitis 2/12    Dr. Gladstone Lighter  . Biceps tendonitis 2/12    Dr. Gladstone Lighter  . History of mammogram 05/25/12    stable, no suspicious finding, extremely dense breast tissue  . S/P hysterectomy   . Neutropenia, unspecified ([place]) 02/2013    slight, likely ethnicity related  . 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 Diabetes mellitus     type II  . Pneumococcal vaccine refused 03/2013  . Refused influenza vaccine 03/2013  . Wears glasses   . History of blood transfusion 1988    hysterectomy    Objective: BP 118/80 mmHg  Pulse 106  Wt 154 lb (69.854 kg)  LMP 12/27/1987  Gen: wd, wn, nad Skin: faint  erythema of dorsal left foot, otherwise warm, dry, no rash Pedal pulses normal Normal sensation of feet. Can't test strength due to limited by pain MSK: tender over bilat ankles and dorsal feet generalized.  Slight puffiness of left lateral ankle.  otherwise no swelling, no deformity.   Pain with ankle ROM bilat.  Toes seem to have normal ROM.  otherwise legs nontender.  Mild varicosities of lower legs.   Ext: no edema   Assessment: Encounter Diagnoses  Name Primary?  . Pain in both feet Yes  . Burning pain   . Type 2 diabetes mellitus with complication, without long-term current use of insulin ([place])   . Essential hypertension     Plan: etiology unclear but possibly gout, more likely than neuropathy given pain with ankle motion.   Labs today.   Begin Ibuprofen prescription strength, ultram prn for pain, rest, and we will call with lab results and additional plan.   Will call with labs as it related to diabetes control as well.  Last visit wasn't quite at goal.  discussed symptoms and treatment for gout vs neuropathy vs other cause.

## 2015-11-27 ENCOUNTER — Non-Acute Institutional Stay (INDEPENDENT_AMBULATORY_CARE_PROVIDER_SITE_OTHER): Payer: BLUE CROSS/BLUE SHIELD | Admitting: Medical

## 2015-11-27 DIAGNOSIS — R945 Abnormal results of liver function studies: Principal | ICD-10-CM

## 2015-11-27 DIAGNOSIS — R7989 Other specified abnormal findings of blood chemistry: Secondary | ICD-10-CM

## 2015-11-27 DIAGNOSIS — R748 Abnormal levels of other serum enzymes: Secondary | ICD-10-CM

## 2015-11-27 LAB — GAMMA GT: GGT: 513 U/L — ABNORMAL HIGH (ref 7–51)

## 2015-11-27 LAB — HEPATITIS PANEL, ACUTE
HCV AB: NEGATIVE
Hep A IgM: NONREACTIVE
Hep B C IgM: NONREACTIVE
Hepatitis B Surface Ag: NEGATIVE

## 2015-11-27 LAB — HEMOGLOBIN A1C
Hgb A1c MFr Bld: 8.8 % — ABNORMAL HIGH (ref <5.7)
Mean Plasma Glucose: 206 mg/dL — ABNORMAL HIGH (ref <117)

## 2015-11-27 LAB — IRON AND TIBC
%SAT: 21 % (ref 11–50)
Iron: 84 ug/dL (ref 45–160)
TIBC: 395 ug/dL (ref 250–450)
UIBC: 311 ug/dL (ref 125–400)

## 2015-11-27 LAB — AFP TUMOR MARKER: AFP TUMOR MARKER: 4 ng/mL (ref <6.1)

## 2015-11-27 NOTE — Progress Notes (Signed)
Lab orders

## 2015-11-30 ENCOUNTER — Telehealth: Payer: Self-pay

## 2015-11-30 DIAGNOSIS — R772 Abnormality of alphafetoprotein: Secondary | ICD-10-CM

## 2015-11-30 DIAGNOSIS — R7989 Other specified abnormal findings of blood chemistry: Secondary | ICD-10-CM

## 2015-11-30 DIAGNOSIS — R748 Abnormal levels of other serum enzymes: Secondary | ICD-10-CM

## 2015-11-30 DIAGNOSIS — R945 Abnormal results of liver function studies: Principal | ICD-10-CM

## 2015-11-30 NOTE — Telephone Encounter (Signed)
Pt called and said that she does not want to see dr Collene Mares, referring to [place]

## 2015-11-30 NOTE — Telephone Encounter (Signed)
Pt called to tell me that she spoke with dr. Youlanda Mighty office and she owes them 100 dollars and they wanted records of her colonoscopy so i am sending that in with her records since the fax has not went through the past 3 times i sent it

## 2015-11-30 NOTE — Addendum Note (Signed)
Addended by: Billie Lade on: 11/30/2015 10:24 AM   Modules accepted: Orders

## 2015-12-01 ENCOUNTER — Telehealth: Payer: Self-pay

## 2015-12-01 ENCOUNTER — Encounter: Payer: Self-pay | Admitting: Gastroenterology

## 2015-12-01 NOTE — Telephone Encounter (Signed)
Pt called to tell me that Bethany Webb could not get her in until 01/30/16 so i called the office and they said that they can not get her in now until 2/7 at 2pm with dr Robet Leu. Is that okay? She will not go to dr Collene Mares & hungs office i tried them first and pt refused

## 2015-12-01 NOTE — Telephone Encounter (Signed)
Lets try Dr. Watt Climes or [facility].   If not one can see her within 2wk, then I want to go ahead and do imaging of liver, so let me know

## 2015-12-01 NOTE — Telephone Encounter (Signed)
Spoke with Bethany Webb they said after i fax her info they can call her and make the appt it would be either 12/11/15 or 12/15/15?

## 2015-12-01 NOTE — Telephone Encounter (Signed)
So i called [employer]	[place], magods office, and they said she owes them money. She said she was calling to pay and i told her to call once she made the appt and i needed to be within 2 weeks

## 2015-12-02 ENCOUNTER — Telehealth: Payer: Self-pay | Admitting: Medical

## 2015-12-02 ENCOUNTER — Other Ambulatory Visit: Payer: Self-pay | Admitting: Medical

## 2015-12-02 DIAGNOSIS — R945 Abnormal results of liver function studies: Secondary | ICD-10-CM

## 2015-12-02 DIAGNOSIS — R7989 Other specified abnormal findings of blood chemistry: Secondary | ICD-10-CM

## 2015-12-02 DIAGNOSIS — R1084 Generalized abdominal pain: Secondary | ICD-10-CM

## 2015-12-02 NOTE — Telephone Encounter (Signed)
Peer to peer being done with shane now and lm for Talyn

## 2015-12-02 NOTE — Telephone Encounter (Signed)
CT was denied by insurance.  Set up abdominal ultrasound ASAP.     I spoke to her about her elevated HgbA1C.    She agrees to study so refer to pharmqeust for diabetes study.      FYI - She is holding off on the Pravachol temporarily while we figure the liver issue out.   She does note [name] has helped appetite cravings and has given her some weight loss, even though glucose not quite at goal.  Bethany Webb.

## 2015-12-02 NOTE — Telephone Encounter (Signed)
error 

## 2015-12-02 NOTE — Telephone Encounter (Signed)
Bethany Webb spoke to Bethany Webb canceled ct since insurance did not approve it

## 2015-12-02 NOTE — Telephone Encounter (Signed)
Ok, and lets go ahead and set her up for CT abdomen due to pain, elevated liver tests.  How is she feeling today?

## 2015-12-02 NOTE — Telephone Encounter (Signed)
Spoke with pt and she is aware of her u/s on 12/07/15 at 345pm and can having npo after 1045am

## 2015-12-02 NOTE — Telephone Encounter (Signed)
Pt has appt with [facility] on 12/16/15 at 2:30 pm message left on pts voicemail

## 2015-12-07 ENCOUNTER — Ambulatory Visit
Admission: RE | Admit: 2015-12-07 | Discharge: 2015-12-07 | Disposition: A | Discharge status: Home / Self Care | Payer: BLUE CROSS/BLUE SHIELD | Source: Ambulatory Visit | Attending: Medical | Admitting: Medical

## 2015-12-07 DIAGNOSIS — R945 Abnormal results of liver function studies: Secondary | ICD-10-CM

## 2015-12-07 DIAGNOSIS — R7989 Other specified abnormal findings of blood chemistry: Secondary | ICD-10-CM

## 2015-12-08 ENCOUNTER — Other Ambulatory Visit: Payer: Self-pay | Admitting: Medical

## 2015-12-09 ENCOUNTER — Other Ambulatory Visit: Payer: Self-pay | Admitting: Medical

## 2015-12-09 MED ORDER — DAPAGLIFLOZIN PRO-METFORMIN ER 10-1000 MG PO TB24: 1.0000 | ORAL_TABLET | Freq: Every day | ORAL | Status: DC

## 2015-12-09 MED ORDER — AMLODIPINE BESYLATE 10 MG PO TABS: 10.0000 mg | ORAL_TABLET | Freq: Every day | ORAL | Status: DC

## 2015-12-09 MED ORDER — PRAVASTATIN SODIUM 80 MG PO TABS: 80.0000 mg | ORAL_TABLET | Freq: Every evening | ORAL | Status: DC

## 2015-12-09 MED ORDER — LISINOPRIL-HYDROCHLOROTHIAZIDE 20-25 MG PO TABS: 1.0000 | ORAL_TABLET | Freq: Every day | ORAL | Status: DC

## 2015-12-09 MED ORDER — GLIPIZIDE 5 MG PO TABS: 5.0000 mg | ORAL_TABLET | Freq: Every day | ORAL | Status: DC

## 2015-12-10 ENCOUNTER — Other Ambulatory Visit: Payer: Self-pay

## 2015-12-16 ENCOUNTER — Other Ambulatory Visit: Payer: Self-pay | Admitting: Physician Assistant

## 2015-12-16 DIAGNOSIS — R110 Nausea: Secondary | ICD-10-CM

## 2015-12-16 DIAGNOSIS — R945 Abnormal results of liver function studies: Principal | ICD-10-CM

## 2015-12-16 DIAGNOSIS — R634 Abnormal weight loss: Secondary | ICD-10-CM

## 2015-12-16 DIAGNOSIS — R109 Unspecified abdominal pain: Secondary | ICD-10-CM

## 2015-12-16 DIAGNOSIS — R7989 Other specified abnormal findings of blood chemistry: Secondary | ICD-10-CM

## 2015-12-17 ENCOUNTER — Ambulatory Visit
Admission: RE | Admit: 2015-12-17 | Discharge: 2015-12-17 | Disposition: A | Discharge status: Home / Self Care | Payer: BLUE CROSS/BLUE SHIELD | Source: Ambulatory Visit | Attending: Physician Assistant | Admitting: Physician Assistant

## 2015-12-17 DIAGNOSIS — R945 Abnormal results of liver function studies: Principal | ICD-10-CM

## 2015-12-17 DIAGNOSIS — R109 Unspecified abdominal pain: Secondary | ICD-10-CM

## 2015-12-17 DIAGNOSIS — R634 Abnormal weight loss: Secondary | ICD-10-CM

## 2015-12-17 DIAGNOSIS — R110 Nausea: Secondary | ICD-10-CM

## 2015-12-17 DIAGNOSIS — R7989 Other specified abnormal findings of blood chemistry: Secondary | ICD-10-CM

## 2015-12-17 MED ORDER — IOPAMIDOL (ISOVUE-300) INJECTION 61%
100.0000 mL | Freq: Once | INTRAVENOUS | Status: AC | PRN
  Administered 2015-12-17: 100 mL via INTRAVENOUS

## 2015-12-18 ENCOUNTER — Telehealth: Payer: Self-pay | Admitting: Medical

## 2015-12-18 DIAGNOSIS — R918 Other nonspecific abnormal finding of lung field: Secondary | ICD-10-CM

## 2015-12-18 NOTE — Telephone Encounter (Signed)
Has she seen GI yet?  CT abdomen pelvis shows possible plaques in the lungs.  Otherwise ok.   Lets have her see GI first, and we will need to consider lung CT after she sees GI regarding these plaques, but lets hold off until GI comes up with a plan/recommendations soon.    Have her call back in 2 wk regarding this and next steps.

## 2015-12-22 NOTE — Telephone Encounter (Signed)
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 01/08/16 with gi, said that she will call and schedule after visit. She said the GI told her she was going to call us, not sure if her message may have went to you but I have not seen anything from the GI

## 2015-12-23 ENCOUNTER — Other Ambulatory Visit: Payer: Self-pay | Admitting: Medical

## 2015-12-23 DIAGNOSIS — R918 Other nonspecific abnormal finding of lung field: Secondary | ICD-10-CM

## 2015-12-23 MED ORDER — METFORMIN HCL 500 MG PO TABS: 500.0000 mg | ORAL_TABLET | Freq: Two times a day (BID) | ORAL | Status: DC

## 2015-12-23 NOTE — Telephone Encounter (Signed)
I spoke to Mrs. Garrett at [facility].   For now, we want Ms. Yzaguirre to c/t Glipizide and restart Metformin 500mg BID since she tolerated that prior.   continue to hold off on cholesterol medication and Xigduo for now.   F/u as planned with GI on 01/08/16.     Lets go ahead and do the chest CT now thought given the abnormal lung finding on CT abdomen.  Courtney - please set up Chest CT (ask [facility] about contrast vs no contrast).     FYI - GI will let me know what other diabetes medications are safer regarding liver disease/liver inflammation.  They are not sure what is going on, but there is still possible concern for worrisome things given the  Elevated alk phos. 

## 2015-12-23 NOTE — Telephone Encounter (Signed)
Got the ct approved the auth code is [id number]. Good until 01/21/16. Appt made for 12/29/15 at 12 but needs to arrive by 11:40 and no solids four hours prior. [address] location. Pt is aware.

## 2015-12-29 ENCOUNTER — Other Ambulatory Visit: Payer: BLUE CROSS/BLUE SHIELD

## 2015-12-29 ENCOUNTER — Inpatient Hospital Stay: Admission: RE | Admit: 2015-12-29 | Payer: BLUE CROSS/BLUE SHIELD | Source: Ambulatory Visit

## 2016-01-04 ENCOUNTER — Ambulatory Visit
Admission: RE | Admit: 2016-01-04 | Discharge: 2016-01-04 | Disposition: A | Discharge status: Home / Self Care | Payer: BLUE CROSS/BLUE SHIELD | Source: Ambulatory Visit | Attending: Medical | Admitting: Medical

## 2016-01-04 ENCOUNTER — Inpatient Hospital Stay: Admission: RE | Admit: 2016-01-04 | Payer: BLUE CROSS/BLUE SHIELD | Source: Ambulatory Visit

## 2016-01-04 DIAGNOSIS — R918 Other nonspecific abnormal finding of lung field: Secondary | ICD-10-CM

## 2016-01-04 MED ORDER — IOPAMIDOL (ISOVUE-300) INJECTION 61%
75.0000 mL | Freq: Once | INTRAVENOUS | Status: AC | PRN
  Administered 2016-01-04: 75 mL via INTRAVENOUS

## 2016-01-05 ENCOUNTER — Ambulatory Visit (INDEPENDENT_AMBULATORY_CARE_PROVIDER_SITE_OTHER): Payer: BLUE CROSS/BLUE SHIELD | Admitting: Medical

## 2016-01-05 ENCOUNTER — Encounter: Payer: Self-pay | Admitting: Medical

## 2016-01-05 ENCOUNTER — Telehealth: Payer: Self-pay

## 2016-01-05 ENCOUNTER — Telehealth: Payer: Self-pay | Admitting: Hematology

## 2016-01-05 VITALS — BP 128/82 | HR 72 | Wt 146.6 lb

## 2016-01-05 DIAGNOSIS — R630 Anorexia: Secondary | ICD-10-CM | POA: Diagnosis not present

## 2016-01-05 DIAGNOSIS — R61 Generalized hyperhidrosis: Secondary | ICD-10-CM | POA: Diagnosis not present

## 2016-01-05 DIAGNOSIS — R599 Enlarged lymph nodes, unspecified: Secondary | ICD-10-CM | POA: Diagnosis not present

## 2016-01-05 DIAGNOSIS — R938 Abnormal findings on diagnostic imaging of other specified body structures: Secondary | ICD-10-CM

## 2016-01-05 DIAGNOSIS — R911 Solitary pulmonary nodule: Secondary | ICD-10-CM | POA: Insufficient documentation

## 2016-01-05 DIAGNOSIS — R748 Abnormal levels of other serum enzymes: Secondary | ICD-10-CM | POA: Diagnosis not present

## 2016-01-05 DIAGNOSIS — R9389 Abnormal findings on diagnostic imaging of other specified body structures: Secondary | ICD-10-CM

## 2016-01-05 DIAGNOSIS — R590 Localized enlarged lymph nodes: Secondary | ICD-10-CM | POA: Insufficient documentation

## 2016-01-05 DIAGNOSIS — J929 Pleural plaque without asbestos: Secondary | ICD-10-CM

## 2016-01-05 DIAGNOSIS — R634 Abnormal weight loss: Secondary | ICD-10-CM | POA: Diagnosis not present

## 2016-01-05 LAB — CBC WITH DIFFERENTIAL/PLATELET
BASOS ABS: 0 10*3/uL (ref 0.0–0.1)
Basophils Relative: 1 % (ref 0–1)
EOS ABS: 0.4 10*3/uL (ref 0.0–0.7)
Eosinophils Relative: 10 % — ABNORMAL HIGH (ref 0–5)
HEMATOCRIT: 38.6 % (ref 36.0–46.0)
Hemoglobin: 12.7 g/dL (ref 12.0–15.0)
LYMPHS ABS: 1.1 10*3/uL (ref 0.7–4.0)
LYMPHS PCT: 24 % (ref 12–46)
MCH: 27.9 pg (ref 26.0–34.0)
MCHC: 32.9 g/dL (ref 30.0–36.0)
MCV: 84.8 fL (ref 78.0–100.0)
MPV: 10 fL (ref 8.6–12.4)
Monocytes Absolute: 0.4 10*3/uL (ref 0.1–1.0)
Monocytes Relative: 10 % (ref 3–12)
NEUTROS PCT: 55 % (ref 43–77)
Neutro Abs: 2.4 10*3/uL (ref 1.7–7.7)
PLATELETS: 491 10*3/uL — AB (ref 150–400)
RBC: 4.55 MIL/uL (ref 3.87–5.11)
RDW: 14.5 % (ref 11.5–15.5)
WBC: 4.4 10*3/uL (ref 4.0–10.5)

## 2016-01-05 LAB — HEPATIC FUNCTION PANEL
ALT: 102 U/L — AB (ref 6–29)
AST: 75 U/L — AB (ref 10–35)
Albumin: 3.9 g/dL (ref 3.6–5.1)
Alkaline Phosphatase: 574 U/L — ABNORMAL HIGH (ref 33–130)
BILIRUBIN DIRECT: 0.7 mg/dL — AB (ref <=0.2)
BILIRUBIN INDIRECT: 1 mg/dL (ref 0.2–1.2)
TOTAL PROTEIN: 7.1 g/dL (ref 6.1–8.1)
Total Bilirubin: 1.7 mg/dL — ABNORMAL HIGH (ref 0.2–1.2)

## 2016-01-05 LAB — BASIC METABOLIC PANEL
BUN: 13 mg/dL (ref 7–25)
CHLORIDE: 96 mmol/L — AB (ref 98–110)
CO2: 28 mmol/L (ref 20–31)
CREATININE: 0.83 mg/dL (ref 0.50–1.05)
Calcium: 9.8 mg/dL (ref 8.6–10.4)
Glucose, Bld: 192 mg/dL — ABNORMAL HIGH (ref 65–99)
POTASSIUM: 3.9 mmol/L (ref 3.5–5.3)
Sodium: 137 mmol/L (ref 135–146)

## 2016-01-05 LAB — TSH: TSH: 1.768 u[IU]/mL (ref 0.350–4.500)

## 2016-01-05 NOTE — Telephone Encounter (Signed)
Referred to oncology

## 2016-01-05 NOTE — Progress Notes (Signed)
Subjective: Chief Complaint  Patient presents with  . follow-up    follow-up on abnormal labs   Medical team: Dorothea Ogle, PA-C primary care here Gynecology, Dr. Lyman Speller GI - Bertha Stakes, NP with Dr. Rosanne Sack Dermatology  Here at my request urgently given abnormal Chest CT findings.  She came in on 11/26/15 with new onset burning pains in the feet and decreased appetite.   She blamed the decreased appetite on the Xigduo we just started, but since we stopped it the last week of December she has had somewhat improved appetite but not great.  Currently eating once daily maybe twice daily, but using Glucerna shakes some.   Has very little appetite.   Still has occasional mild burning pain in feet.  This has eased off from early December.   Been having several weeks of low back pain.  Pain is throbbing, worse if walking too much, fairly constant.  Taking Ibuprofen once daily which helps some, takes this when she gets home from work.  Had rash on back but this resolved.  Rash was there about a week, no vesicles, just mild rash that went away.  Has nausea, but no vomiting.  Is having night sweats.   Has almost 20lb when she stopped eating in November.  No other aggravating or relieving factors. No other complaint.  Diabetes - after speaking with GI on 12/23/15 about her elevated LFTs, abdominal ultrasound, CT abdomen, and medications, we decided to hold off on Xigduo and Pravachol for now given the elevated LFTs, and just c/t Glipizide 5mg  daily and go back on Metformin 500mg  BID for now.   Sugars been running 160s.    She is sad that she didn't get to go on her yearly visit to [place] over Christmas to see her grandchildren given recommendations by GI and [name] that her liver tests are high and no good answer yet.    Past Medical History  Diagnosis Date  . Hypertension   . Hyperlipidemia   . Allergy   . Supraspinatus tendonitis 2/12    Dr. Gladstone Lighter  . Biceps tendonitis 2/12    Dr.  Gladstone Lighter  . History of mammogram 05/25/12    stable, no suspicious finding, extremely dense breast tissue  . S/P hysterectomy   . Neutropenia, unspecified ([place]) 02/2013    slight, likely ethnicity related  . 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 Diabetes mellitus     type II  . Pneumococcal vaccine refused 03/2013  . Refused influenza vaccine 03/2013  . Wears glasses   . History of blood transfusion 1988    hysterectomy   Past Surgical History  Procedure Laterality Date  . Breast reduction surgery  1994  . Abdominal hysterectomy  1990    and BSO; endometriosis  . Colonoscopy  03/06/12    sigmoid polyp; repeat 10 years; Dr. Tora Duck  . Anterior cervical decomp/discectomy fusion N/A 06/20/2013    Procedure: ANTERIOR CERVICAL DECOMPRESSION/DISCECTOMY FUSION 1 LEVEL;  Surgeon: Ophelia Charter, MD;  Location: [facility];  Service: Neurosurgery;  Laterality: N/A;  C45 anterior cervical decompression with fusion interbody prothesis plating and bonegraft  . Carpal tunnel release Right 06/20/2013    Procedure: CARPAL TUNNEL RELEASE;  Surgeon: Ophelia Charter, MD;  Location: [facility];  Service: Neurosurgery;  Laterality: Right;  RIGHT carpal tunnel release  . Lung surgery  1987  endometriosis in lungs   Review of Systems Constitutional: -fever, +chills, +sweats, +unexpected weight change,+fatigue ENT: -runny nose, -ear pain, -sore throat Cardiology:  -chest pain, -palpitations, -edema Respiratory: -cough, -shortness of breath, -wheezing Gastroenterology: -abdominal pain, +nausea, -vomiting, -diarrhea, -constipation  Hematology: -bleeding or bruising problems Musculoskeletal: -arthralgias, -myalgias, -joint swelling, +back pain Ophthalmology: -vision changes Urology: -dysuria, -difficulty urinating, -hematuria, -urinary frequency, -urgency Neurology: -headache, +weakness, -tingling, -numbness      Objective: BP 128/82 mmHg  Pulse 72  Wt 146 lb 9.6 oz (66.497 kg)  LMP 12/27/1987  Wt Readings from Last 3 Encounters:  01/05/16 146 lb 9.6 oz (66.497 kg)  11/26/15 154 lb (69.854 kg)  11/13/15 161 lb (73.029 kg)    General appearance: alert, no distress, WD/WN, very pleasant AA female Oral cavity: MMM, no lesions Neck: supple, no lymphadenopathy, no thyromegaly, no masses Chest: nontender, no obvious lymphadenopathy or mass Back: nontender, although she says palpation flares up the pain, no obvious deformity Skin: no obvious rash on the back Heart: RRR, normal S1, S2, no murmurs Lungs: CTA bilaterally, no wheezes, rhonchi, or rales Abdomen: +bs, soft, non tender, non distended, no masses, no hepatomegaly, no splenomegaly Pulses: 2+ symmetric, upper and lower extremities, normal cap refill Ext: no edema No obvious axillary or inguinal lymphadenopathy    CT chest 01/04/16:  IMPRESSION: 1. Extensive mediastinal and bilateral hilar lymphadenopathy. Findings are concerning for potential lymphoproliferative disorder. Less likely, findings could be seen in the setting of a primary bronchogenic malignancy such as a small cell carcinoma. Further evaluation with PET-CT and biopsy is suggested for diagnostic and staging purposes in the near future. 2. 8 x 6 mm smoothly marginated pulmonary nodule in the right middle lobe associated with the minor fissure. This is nonspecific, but favored to be a subpleural lymph node. Multiple other smaller 2-4 mm pulmonary nodules are also noted in the lungs bilaterally. Attention on future follow up studies is recommended. 3. Bilateral pleural plaques. These are calcified in the left hemithorax and noncalcified in the right hemithorax. These are presumably secondary to prior episodes of infection or pleural hemorrhage, potentially related to catamenial hemothorax in this patient with history of endometriosis.    Assessment: Encounter  Diagnoses  Name Primary?  . Elevated liver enzymes Yes  . Abnormal chest CT   . Lymphadenopathy, mediastinal   . Pleural plaque   . Pulmonary nodule   . Anorexia   . Loss of weight   . Night sweats      Plan: We discussed her CT chest findings, recent labs, showing findings worrisome for lymphoproliferative disease, possible lung tumor.  She is losing weight, has B symptoms, is not eating well.   We were able to secure her a hematology/oncology appt for Thursday, 2 days from now.    I asked her to supplement with the Glucerna for meal replacement when not eating, advised she try prenatal or other multivitamin, hydrate well.   Of note, she had lung surgery in the '80s for endometriosis tissue in the lungs which may explain the pleural plaques, but the nodules and lymphadenopathy are concerning.    Regarding diabetes, we may end up adding basal insulin if her sugars continue to run high.   She is on Glipizide and we changed back recently from Minco (which she was on briefly) back to plain Metformin which she only tolerates at lower doses.   She is not quite at goal.    She will likely need Megace for appetite, but I'll defer  to oncology Thursday.  Of note Colonoscopy - single benign hyperplastic polyp, 03/06/12, Dr. Tora Duck, [facility] Mammogram 11/03/14 normal Pap - s/p hysterectomy  F/u pending labs, oncology consult.

## 2016-01-05 NOTE — Telephone Encounter (Signed)
new patient appt-s/w Courtney @ [facility] and gave np appt for 01/12 @ 1 w/Dr. Irene Limbo. Per Loma Sousa will notfiy patient of appt.

## 2016-01-06 ENCOUNTER — Other Ambulatory Visit: Payer: Self-pay | Admitting: Medical

## 2016-01-06 MED ORDER — INSULIN GLARGINE 100 UNIT/ML SOLOSTAR PEN: 10.0000 [IU] | PEN_INJECTOR | Freq: Every day | SUBCUTANEOUS | Status: DC

## 2016-01-06 MED ORDER — GLIPIZIDE 5 MG PO TABS: 5.0000 mg | ORAL_TABLET | Freq: Every day | ORAL | Status: DC

## 2016-01-06 MED ORDER — METFORMIN HCL 500 MG PO TABS: 500.0000 mg | ORAL_TABLET | Freq: Two times a day (BID) | ORAL | Status: DC

## 2016-01-06 MED ORDER — MEGESTROL ACETATE 20 MG PO TABS: 20.0000 mg | ORAL_TABLET | Freq: Two times a day (BID) | ORAL | Status: DC

## 2016-01-06 MED ORDER — PRENATAL VITAMIN 27-0.8 MG PO TABS: 1.0000 | ORAL_TABLET | Freq: Every day | ORAL | Status: DC

## 2016-01-07 ENCOUNTER — Ambulatory Visit (HOSPITAL_BASED_OUTPATIENT_CLINIC_OR_DEPARTMENT_OTHER): Payer: BLUE CROSS/BLUE SHIELD

## 2016-01-07 ENCOUNTER — Telehealth: Payer: Self-pay | Admitting: Hematology

## 2016-01-07 ENCOUNTER — Ambulatory Visit (HOSPITAL_BASED_OUTPATIENT_CLINIC_OR_DEPARTMENT_OTHER): Payer: BLUE CROSS/BLUE SHIELD | Admitting: Hematology

## 2016-01-07 ENCOUNTER — Telehealth: Payer: Self-pay

## 2016-01-07 ENCOUNTER — Encounter: Payer: Self-pay | Admitting: Hematology

## 2016-01-07 VITALS — BP 125/66 | HR 110 | Temp 98.2°F | Resp 18 | Ht 63.5 in | Wt 145.1 lb

## 2016-01-07 DIAGNOSIS — R591 Generalized enlarged lymph nodes: Secondary | ICD-10-CM | POA: Diagnosis not present

## 2016-01-07 DIAGNOSIS — R918 Other nonspecific abnormal finding of lung field: Secondary | ICD-10-CM | POA: Diagnosis not present

## 2016-01-07 DIAGNOSIS — R61 Generalized hyperhidrosis: Secondary | ICD-10-CM | POA: Diagnosis not present

## 2016-01-07 DIAGNOSIS — R590 Localized enlarged lymph nodes: Secondary | ICD-10-CM

## 2016-01-07 DIAGNOSIS — R599 Enlarged lymph nodes, unspecified: Secondary | ICD-10-CM | POA: Diagnosis not present

## 2016-01-07 DIAGNOSIS — R682 Dry mouth, unspecified: Secondary | ICD-10-CM

## 2016-01-07 DIAGNOSIS — R748 Abnormal levels of other serum enzymes: Secondary | ICD-10-CM | POA: Diagnosis not present

## 2016-01-07 DIAGNOSIS — R634 Abnormal weight loss: Secondary | ICD-10-CM

## 2016-01-07 DIAGNOSIS — D869 Sarcoidosis, unspecified: Secondary | ICD-10-CM

## 2016-01-07 LAB — COMPREHENSIVE METABOLIC PANEL
ALBUMIN: 3.8 g/dL (ref 3.5–5.0)
ALK PHOS: 662 U/L — AB (ref 40–150)
ALT: 112 U/L — ABNORMAL HIGH (ref 0–55)
AST: 80 U/L — ABNORMAL HIGH (ref 5–34)
Anion Gap: 14 mEq/L — ABNORMAL HIGH (ref 3–11)
BUN: 10.1 mg/dL (ref 7.0–26.0)
CALCIUM: 10 mg/dL (ref 8.4–10.4)
CHLORIDE: 97 meq/L — AB (ref 98–109)
CO2: 27 mEq/L (ref 22–29)
Creatinine: 0.8 mg/dL (ref 0.6–1.1)
Glucose: 132 mg/dl (ref 70–140)
POTASSIUM: 3.7 meq/L (ref 3.5–5.1)
SODIUM: 138 meq/L (ref 136–145)
Total Bilirubin: 1.51 mg/dL — ABNORMAL HIGH (ref 0.20–1.20)
Total Protein: 7.8 g/dL (ref 6.4–8.3)

## 2016-01-07 LAB — PROTIME-INR
INR: 0.8 — AB (ref 2.00–3.50)
PROTIME: 9.6 s — AB (ref 10.6–13.4)

## 2016-01-07 LAB — CBC & DIFF AND RETIC
BASO%: 0.8 % (ref 0.0–2.0)
Basophils Absolute: 0 10*3/uL (ref 0.0–0.1)
EOS ABS: 0.5 10*3/uL (ref 0.0–0.5)
EOS%: 10.6 % — AB (ref 0.0–7.0)
HEMATOCRIT: 37.8 % (ref 34.8–46.6)
HEMOGLOBIN: 12.7 g/dL (ref 11.6–15.9)
Immature Retic Fract: 0.7 % — ABNORMAL LOW (ref 1.60–10.00)
LYMPH%: 31.7 % (ref 14.0–49.7)
MCH: 28 pg (ref 25.1–34.0)
MCHC: 33.6 g/dL (ref 31.5–36.0)
MCV: 83.4 fL (ref 79.5–101.0)
MONO#: 0.4 10*3/uL (ref 0.1–0.9)
MONO%: 7.5 % (ref 0.0–14.0)
NEUT%: 49.4 % (ref 38.4–76.8)
NEUTROS ABS: 2.4 10*3/uL (ref 1.5–6.5)
Platelets: 481 10*3/uL — ABNORMAL HIGH (ref 145–400)
RBC: 4.53 10*6/uL (ref 3.70–5.45)
RDW: 14.6 % — AB (ref 11.2–14.5)
RETIC %: 1.48 % (ref 0.70–2.10)
Retic Ct Abs: 67.04 10*3/uL (ref 33.70–90.70)
WBC: 4.8 10*3/uL (ref 3.9–10.3)
lymph#: 1.5 10*3/uL (ref 0.9–3.3)

## 2016-01-07 LAB — LACTATE DEHYDROGENASE: LDH: 248 U/L — AB (ref 125–245)

## 2016-01-07 LAB — CHCC SMEAR

## 2016-01-07 MED ORDER — INSULIN DETEMIR 100 UNIT/ML FLEXPEN: 10.0000 [IU] | PEN_INJECTOR | Freq: Every day | SUBCUTANEOUS | Status: DC

## 2016-01-07 NOTE — Progress Notes (Signed)
[facility]    HEMATOLOGY/ONCOLOGY CONSULTATION NOTE  Date of Service: 01/07/2016  Patient Care Team: Denita Lung, MD as PCP - General (Family Medicine)  Dorothea Ogle PA-C  CHIEF COMPLAINTS/PURPOSE OF CONSULTATION:  Mediastinal lymphadenopathy  HISTORY OF PRESENTING ILLNESS:  Bethany Webb is a wonderful 55 y.o. female who has been referred to [name] by Dorothea Ogle PA-C for evaluation and management of newly noted mediastinal lymphadenopathy.  Patient ID 65-year-old African-American history of hypertension, dyslipidemia, diabetes who has been referred to [name] for evaluation of newly noted mediastinal and supraclavicular lymphadenopathy. She was being evaluated for elevated liver function tests by her primary care physician. Negative for viral hepatitis A, B, and C labs, negative ANA, AMA. INR was normal. Normal iron labs. Her statins were held. [name] was discontinued due to concerns for possible drug related liver injury. Patient had a CT of her abdomen and pelvis on 12/17/2015 that showed a mild diffuse fatty liver and no other acute abnormalities. Incidentally noted right lung base pleural plaques. Patient subsequently had a CT of the chest that showed extensive mediastinal and bilateral hilar lymphadenopathy and pulmonary nodules. This triggered a referral to [name] due to concern of lymphoma and workup the etiology of her lymphadenopathy.  Patient notes that she has had increasing fatigue and dyspnea on exertion since November 2016. She also reports having had some burning paresthesias in her lower extremities that have since improved. Had nausea vomiting and diarrhea in September 2016 with decreased appetite. Notes that in the first week of December 2016 she developed a rash on her back which was very pruritic. This has since improved. She continues to remain significantly prohibitive all over. Has also developed increasing lower back pain for the last 2 months. Denies any take bites. Subjective  chills and night sweats. 20-25 pound weight loss. Notes some teeth pain. Has not noted any other skin lumps or bumps. Does note dry eyes with Sand in the eye sensation.    MEDICAL HISTORY:  Past Medical History  Diagnosis Date  . Hypertension   . Hyperlipidemia   . Allergy   . Supraspinatus tendonitis 2/12    Dr. Gladstone Lighter  . Biceps tendonitis 2/12    Dr. Gladstone Lighter  . History of mammogram 05/25/12    stable, no suspicious finding, extremely dense breast tissue  . S/P hysterectomy   . Neutropenia, unspecified ([place]) 02/2013    slight, likely ethnicity related  . 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 Diabetes mellitus     type II  . Pneumococcal vaccine refused 03/2013  . Refused influenza vaccine 03/2013  . Wears glasses   . History of blood transfusion 1988    hysterectomy   SURGICAL HISTORY: Past Surgical History  Procedure Laterality Date  . Breast reduction surgery  1994  . Abdominal hysterectomy  1990    and BSO; endometriosis  . Colonoscopy  03/06/12    sigmoid polyp; repeat 10 years; Dr. Tora Duck  . Anterior cervical decomp/discectomy fusion N/A 06/20/2013    Procedure: ANTERIOR CERVICAL DECOMPRESSION/DISCECTOMY FUSION 1 LEVEL;  Surgeon: Ophelia Charter, MD;  Location: [facility];  Service: Neurosurgery;  Laterality: N/A;  C45 anterior cervical decompression with fusion interbody prothesis plating and bonegraft  . Carpal tunnel release Right 06/20/2013    Procedure: CARPAL TUNNEL RELEASE;  Surgeon: Ophelia Charter, MD;  Location: [facility];  Service: Neurosurgery;  Laterality: Right;  RIGHT carpal tunnel release  . Lung surgery  1987    endometriosis in lungs    SOCIAL HISTORY: Social History   Social History  . Marital Status: Single    Spouse Name: N/A  . Number of Children: N/A  . Years of Education: N/A   Occupational History  . Not on file.   Social History Main  Topics  . Smoking status: Never Smoker   . Smokeless tobacco: Never Used  . Alcohol Use: 0.0 oz/week    0 Glasses of wine per week     Comment: occ wine, maybe 2-3 times years  . Drug Use: No  . Sexual Activity: No   Other Topics Concern  . 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 Cancer Paternal Grandmother     breast  . Diabetes Other     maternal and paternal grandmother  . Heart disease Father 45    died of MI, CABG   . Multiple sclerosis Sister   . Asthma Brother   . Cancer Maternal Uncle     hematologic  . Hypertension Maternal Grandmother   . Cancer Maternal Grandfather     prostate  . Hypertension Maternal Grandfather     ALLERGIES:  is allergic to codeine.  MEDICATIONS:  Current Outpatient Prescriptions  Medication Sig Dispense Refill  . amLODipine (NORVASC) 10 MG tablet Take 1 tablet (10 mg total) by mouth daily. 90 tablet 3  . glipiZIDE (GLUCOTROL) 5 MG tablet Take 1 tablet (5 mg total) by mouth daily before breakfast. 90 tablet 2  . glucose blood test strip Test blood sugar twice daily. Pt uses one touch ultra 100 each 2  . ibuprofen (ADVIL,MOTRIN) 800 MG tablet Take 1 tablet (800 mg total) by mouth every 8 (eight) hours as needed. 30 tablet 0  . Insulin Detemir (LEVEMIR) 100 UNIT/ML Pen Inject 10 Units into the skin daily at 10 pm. 15 mL 5  . Lancets 30G MISC Test blood sugars twice daily 100 each 2  . lisinopril-hydrochlorothiazide (PRINZIDE,ZESTORETIC) 20-25 MG tablet Take 1 tablet by mouth daily. 90 tablet 3  . megestrol (MEGACE) 20 MG tablet Take 1 tablet (20 mg total) by mouth 2 (two) times daily. 60 tablet 2  . metFORMIN (GLUCOPHAGE) 500 MG tablet Take 1 tablet (500 mg total) by mouth 2 (two) times daily with a meal. 1 tablet po BID for diabetes 180 tablet 1  . NON FORMULARY  Reported on 01/05/2016    . peppermint oil liquid by Does not apply route as needed. Reported on 01/05/2016    . Prenatal Vit-Fe Fumarate-FA (PRENATAL VITAMIN) 27-0.8 MG TABS Take 1 tablet by mouth daily. 30 tablet 11   No current facility-administered medications for this visit.    REVIEW OF SYSTEMS:    10 Point review of Systems was done is negative except as noted above.  PHYSICAL EXAMINATION: ECOG PERFORMANCE STATUS: 2 - Symptomatic, <50% confined to bed  . Filed Vitals:   01/07/16 1244  BP: 125/66  Pulse: 110  Temp: 98.2 F (36.8 C)  Resp: 18   Filed Weights   01/07/16 1244  Weight: 145 lb 1.6 oz (65.817 kg)   .Body mass index is 25.3 kg/(m^2).  GENERAL:alert, in no acute distress and comfortable SKIN: skin color, texture, turgor are normal, no  rashes or significant lesions EYES: normal, conjunctiva are pink and non-injected, sclera clear OROPHARYNX:no exudate, no erythema and lips, buccal mucosa, and tongue normal  NECK: supple, no JVD, thyroid normal size, non-tender, without nodularity LYMPH:  some palpable supraclavicular lymphadenopathy, no sizable palpable lymphadenopathy in axillary or inguinal LUNGS: clear to auscultation with normal respiratory effort HEART: regular rate & rhythm,  no murmurs and no lower extremity edema ABDOMEN: abdomen soft, mild tenderness to palpation over her epigastric region and right upper quadrant, normoactive bowel sounds  Musculoskeletal: no cyanosis of digits and no clubbing  PSYCH: alert & oriented x 3 with fluent speech NEURO: no focal motor/sensory deficits  LABORATORY DATA:  I have reviewed the data as listed  . CBC Latest Ref Rng 01/07/2016 01/05/2016 11/26/2015  WBC 3.9 - 10.3 10e3/uL 4.8 4.4 3.4(L)  Hemoglobin 11.6 - 15.9 g/dL 12.7 12.7 12.9  Hematocrit 34.8 - 46.6 % 37.8 38.6 39.8  Platelets 145 - 400 10e3/uL 481(H) 491(H) 390    . CMP Latest Ref Rng 01/05/2016 11/26/2015 05/01/2015  Glucose 65 - 99 mg/dL 192(H) 161(H)  176(H)  BUN 7 - 25 mg/dL 13 18 11   Creatinine 0.50 - 1.05 mg/dL 0.83 0.94 0.85  Sodium 135 - 146 mmol/L 137 136 138  Potassium 3.5 - 5.3 mmol/L 3.9 4.0 3.8  Chloride 98 - 110 mmol/L 96(L) 96(L) 100  CO2 20 - 31 mmol/L 28 27 29   Calcium 8.6 - 10.4 mg/dL 9.8 9.3 9.2  Total Protein 6.1 - 8.1 g/dL 7.1 7.3 7.5  Total Bilirubin 0.2 - 1.2 mg/dL 1.7(H) 1.8(H) 0.7  Alkaline Phos 33 - 130 U/L 574(H) 370(H) 71  AST 10 - 35 U/L 75(H) 69(H) 32  ALT 6 - 29 U/L 102(H) 69(H) 31     RADIOGRAPHIC STUDIES: I have personally reviewed the radiological images as listed and agreed with the findings in the report. Ct Chest W Contrast  01/04/2016  CLINICAL DATA:  55 year old female with shortness of breath during exertion. Nonsmoker. History of endometriosis affecting the lungs status post lung surgery in the 1980s. Evaluate for potential lung mass. EXAM: CT CHEST WITH CONTRAST TECHNIQUE: Multidetector CT imaging of the chest was performed during intravenous contrast administration. CONTRAST:  72mL ISOVUE-300 IOPAMIDOL (ISOVUE-300) INJECTION 61% COMPARISON:  No priors.  CT the abdomen and pelvis 12/17/2015. FINDINGS: Mediastinum/Lymph Nodes: Heart size is normal. There is no significant pericardial fluid, thickening or pericardial calcification. Multiple enlarged mediastinal and bilateral hilar lymph nodes, measuring up to 23 mm in short axis in the subcarinal nodal station, 13 mm in the low right paratracheal nodal station, 15 mm in the AP window, and 13 mm in the right hilar region. Esophagus is unremarkable in appearance. No axillary lymphadenopathy. Lungs/Pleura: Calcified granuloma in the left lower lobe. 8 x 6 mm smoothly marginated nodule in the right middle lobe abutting the minor fissure (image 25 of series 4). A few other scattered 2-4 mm pulmonary nodules are noted, but are nonspecific. Some areas of pleural thickening are noted in the thorax bilaterally, most evident in the base of the right hemithorax (image  36 of series 3), where there is a noncalcified pleural plaque, and in the anterior aspect of the left hemithorax (image 25 of series 3) where there is a densely calcified left-sided pleural plaque. No calcified right-sided pleural plaques are identified at this time. No acute consolidative airspace disease. No pleural effusions. Upper Abdomen: Unremarkable. Musculoskeletal/Soft Tissues: There are no aggressive appearing lytic or blastic lesions noted in the visualized  portions of the skeleton. Orthopedic fixation hardware in the lower cervical spine incidentally noted. IMPRESSION: 1. Extensive mediastinal and bilateral hilar lymphadenopathy. Findings are concerning for potential lymphoproliferative disorder. Less likely, findings could be seen in the setting of a primary bronchogenic malignancy such as a small cell carcinoma. Further evaluation with PET-CT and biopsy is suggested for diagnostic and staging purposes in the near future. 2. 8 x 6 mm smoothly marginated pulmonary nodule in the right middle lobe associated with the minor fissure. This is nonspecific, but favored to be a subpleural lymph node. Multiple other smaller 2-4 mm pulmonary nodules are also noted in the lungs bilaterally. Attention on future followup studies is recommended. 3. Bilateral pleural plaques. These are calcified in the left hemithorax and noncalcified in the right hemithorax. These are presumably secondary to prior episodes of infection or pleural hemorrhage, potentially related to catamenial hemothorax in this patient with history of endometriosis. Electronically Signed   By: Vinnie Langton M.D.   On: 01/04/2016 15:10   Ct Abdomen Pelvis W Contrast  12/17/2015  CLINICAL DATA:  Elevated LFTs and abdominal pain EXAM: CT ABDOMEN AND PELVIS WITH CONTRAST TECHNIQUE: Multidetector CT imaging of the abdomen and pelvis was performed using the standard protocol following bolus administration of intravenous contrast. CONTRAST:  156mL  ISOVUE-300 IOPAMIDOL (ISOVUE-300) INJECTION 61% COMPARISON:  12/07/2015 FINDINGS: Lung bases are well aerated. Some very minimal atelectatic changes are noted. Additionally there is some thickening along the right diaphragm which may be related to pleural plaquing. Diffuse decreased attenuation of the liver is noted consistent with fatty infiltration. The gallbladder is decompressed. The spleen, adrenal glands and pancreas are within normal limits. The kidneys are well visualized bilaterally and reveal no renal calculi or obstructive changes. The bladder is partially decompressed. No pelvic mass lesion is seen. The uterus has been surgically removed. The appendix is not well appreciated. No inflammatory changes are noted. No acute bony abnormality is seen. IMPRESSION: Changes suggestive of pleural plaques in the right lung base. Nonemergent CT of the chest is recommended for further evaluation. No other focal abnormality is seen. Electronically Signed   By: Inez Catalina M.D.   On: 12/17/2015 11:59    ASSESSMENT & PLAN:   55 year old African-American female with history of hypertension, dyslipidemia, diabetes with  #1 new supraclavicular and extensive mediastinal and bilateral hilar lymphadenopathy with lung nodules. Associated weight loss of 20-25 pounds,Pruritus with resolved skin rash. Overall picture concerning for possible lymphoproliferative syndrome,  Carcinoma, inflammatory disorder like sarcoidosis or less likely an infectious disorder such as a fungal infection or TB. Plan -PET/CT scan to evaluate her further. -Patient referred to Dr. Salomon Fick for consideration of supraclavicular or mediastinal lymph node biopsy. -ACE level, LDH -Follow-up after her lymph node biopsy and PET/CT to discuss diagnosis and further treatment plan.  #2 abnormal liver function tests - has been worked up with no etiology noted. Some baseline fatty liver changes. Cannot rule out the possibility of sarcoidosis. -she  has been given a referral for evaluation by gastroenterology.  #3 dry eyes and dry mouth- use artificial tears and maintain oral hydration   All of the patients questions were answered with apparent satisfaction. The patient knows to call the clinic with any problems, questions or concerns.  I spent 60 minutes counseling the patient face to face. The total time spent in the appointment was 70 minutes and more than 50% was on counseling and direct patient cares.    Sullivan Lone MD MS AAHIVMS [place]  Hematology/Oncology Physician [facility]  (Office):       [phone] (Work cell):  [phone] (Fax):           [phone]  01/07/2016 1:04 PM

## 2016-01-07 NOTE — Telephone Encounter (Signed)
Levemir filled per [place] because lantus was to expensive for pt

## 2016-01-07 NOTE — Telephone Encounter (Signed)
Patient given avs report and appointments for January and sent back to lab. Left message for Jenny Reichmann at [place] re appointment with Dr. Roxan Hockey - referral in [place] will call patient re appointment - patient aware. [facility] will call re pet scan - patient aware.

## 2016-01-08 ENCOUNTER — Telehealth: Payer: Self-pay | Admitting: *Deleted

## 2016-01-08 LAB — SEDIMENTATION RATE: Sedimentation Rate-Westergren: 25 mm/hr (ref 0–40)

## 2016-01-08 LAB — ANGIOTENSIN CONVERTING ENZYME: Angio Convert Enzyme: <15 U/L (ref 14–82)

## 2016-01-08 LAB — APTT: APTT: 26 s (ref 24–33)

## 2016-01-08 NOTE — Telephone Encounter (Signed)
Patient called.  Dr. Irene Limbo has scheduled patient for a PET scan. However, she is clautrophobic.  [place] called to schedule this, but advised her to call her doctor.  Previously she has had one at [facility].  Please call her back at [phone].

## 2016-01-11 ENCOUNTER — Institutional Professional Consult (permissible substitution) (INDEPENDENT_AMBULATORY_CARE_PROVIDER_SITE_OTHER): Payer: BLUE CROSS/BLUE SHIELD | Admitting: Thoracic Surgery (Cardiothoracic Vascular Surgery)

## 2016-01-11 ENCOUNTER — Encounter: Payer: Self-pay | Admitting: Thoracic Surgery (Cardiothoracic Vascular Surgery)

## 2016-01-11 VITALS — BP 146/92 | HR 110 | Resp 20 | Ht 63.5 in | Wt 144.0 lb

## 2016-01-11 DIAGNOSIS — R599 Enlarged lymph nodes, unspecified: Secondary | ICD-10-CM | POA: Diagnosis not present

## 2016-01-11 DIAGNOSIS — R918 Other nonspecific abnormal finding of lung field: Secondary | ICD-10-CM | POA: Diagnosis not present

## 2016-01-11 DIAGNOSIS — R590 Localized enlarged lymph nodes: Secondary | ICD-10-CM

## 2016-01-11 NOTE — Progress Notes (Addendum)
PCP is Wyatt Haste, MD Referring Provider is Denita Lung, MD  Chief Complaint  Patient presents with  . Adenopathy    Surgical eval, Chest CT 01/04/2016, pending PET Scan on 01/21/16   . Lung Mass    HPI: 55 year old woman sent for consultation regarding mediastinal and hilar adenopathy.  Bethany Webb is a 55 year old woman who has been feeling poorly over the past several months. She first noted swelling in her feet. She went to see Dr. Redmond School. Multiple tests were done she was noted to have an abnormal liver panel. She had a CT abdomen done in late December which showed a fatty liver and also noted some pleural plaques in the chest. That led to a CT of the chest in early January which showed multiple small lung nodules as well as hilar and mediastinal adenopathy. She is scheduled to have a PET/CT on January 26.  In addition to swelling in her feet, she also complains of fatigue, decreased energy, poor appetite, a 20 pound weight loss over the past 3 months, dizzy spells, and shortness of breath with walking for 30 minutes or been on her feet for an hour to an hour and a half. She used to be able to do those things without any issues. She complains of constant back pain as well as a left chest/flank pain.  Zubrod Score: At the time of surgery this patient's most appropriate activity status/level should be described as: []     0    Normal activity, no symptoms [x]     1    Restricted in physical strenuous activity but ambulatory, able to do out light work []     2    Ambulatory and capable of self care, unable to do work activities, up and about >50 % of waking hours                              []     3    Only limited self care, in bed greater than 50% of waking hours []     4    Completely disabled, no self care, confined to bed or chair []     5    Moribund   Past Medical History  Diagnosis Date  . Hypertension   . Hyperlipidemia   . Allergy   . Supraspinatus tendonitis 2/12     Dr. Gladstone Lighter  . Biceps tendonitis 2/12    Dr. Gladstone Lighter  . History of mammogram 05/25/12    stable, no suspicious finding, extremely dense breast tissue  . S/P hysterectomy   . Neutropenia, unspecified ([place]) 02/2013    slight, likely ethnicity related  . 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 Diabetes mellitus     type II  . Pneumococcal vaccine refused 03/2013  . Refused influenza vaccine 03/2013  . Wears glasses   . History of blood transfusion 1988    hysterectomy    Past Surgical History  Procedure Laterality Date  . Breast reduction surgery  1994  . Abdominal hysterectomy  1990    and BSO; endometriosis  . Colonoscopy  03/06/12    sigmoid polyp; repeat 10 years; Dr. Tora Duck  . Anterior cervical decomp/discectomy fusion N/A 06/20/2013    Procedure: ANTERIOR CERVICAL DECOMPRESSION/DISCECTOMY FUSION 1 LEVEL;  Surgeon: Ophelia Charter, MD;  Location: [facility];  Service: Neurosurgery;  Laterality:  N/A;  C45 anterior cervical decompression with fusion interbody prothesis plating and bonegraft  . Carpal tunnel release Right 06/20/2013    Procedure: CARPAL TUNNEL RELEASE;  Surgeon: Ophelia Charter, MD;  Location: [facility];  Service: Neurosurgery;  Laterality: Right;  RIGHT carpal tunnel release  . Lung surgery  1987    endometriosis in lungs    Family History  Problem Relation Age of Onset  . Cancer Paternal Grandmother     breast  . Diabetes Other     maternal and paternal grandmother  . Heart disease Father 62    died of MI, CABG   . Multiple sclerosis Sister   . Asthma Brother   . Cancer Maternal Uncle     hematologic  . Hypertension Maternal Grandmother   . Cancer Maternal Grandfather     prostate  . Hypertension Maternal Grandfather     Social History Social History  Substance Use Topics  . Smoking status: Never Smoker   . Smokeless tobacco: Never Used  . Alcohol Use: 0.0  oz/week    0 Glasses of wine per week     Comment: occ wine, maybe 2-3 times years    Current Outpatient Prescriptions  Medication Sig Dispense Refill  . amLODipine (NORVASC) 10 MG tablet Take 1 tablet (10 mg total) by mouth daily. 90 tablet 3  . glipiZIDE (GLUCOTROL) 5 MG tablet Take 1 tablet (5 mg total) by mouth daily before breakfast. 90 tablet 2  . glucose blood test strip Test blood sugar twice daily. Pt uses one touch ultra 100 each 2  . ibuprofen (ADVIL,MOTRIN) 800 MG tablet Take 1 tablet (800 mg total) by mouth every 8 (eight) hours as needed. 30 tablet 0  . Insulin Detemir (LEVEMIR) 100 UNIT/ML Pen Inject 10 Units into the skin daily at 10 pm. 15 mL 5  . Lancets 30G MISC Test blood sugars twice daily 100 each 2  . lisinopril-hydrochlorothiazide (PRINZIDE,ZESTORETIC) 20-25 MG tablet Take 1 tablet by mouth daily. 90 tablet 3  . megestrol (MEGACE) 20 MG tablet Take 1 tablet (20 mg total) by mouth 2 (two) times daily. 60 tablet 2  . NON FORMULARY Reported on 01/05/2016    . peppermint oil liquid by Does not apply route as needed. Reported on 01/05/2016    . Prenatal Vit-Fe Fumarate-FA (PRENATAL VITAMIN) 27-0.8 MG TABS Take 1 tablet by mouth daily. 30 tablet 11   No current facility-administered medications for this visit.    Allergies  Allergen Reactions  . Codeine Nausea Only    Review of Systems  Constitutional: Positive for fever, chills, activity change, appetite change, fatigue and unexpected weight change (20 pounds in 3 months).  HENT: Negative for trouble swallowing.   Eyes: Positive for visual disturbance (blurry vision).  Respiratory: Positive for shortness of breath (with exertion, being on feet 1.5 hours, walking 30 minutes). Negative for cough and wheezing.   Cardiovascular: Positive for chest pain (with walking for 30 minutes) and leg swelling (feet). Negative for palpitations.  Gastrointestinal: Negative for abdominal pain and blood in stool.  Genitourinary:  Negative for dysuria and hematuria.  Musculoskeletal: Positive for back pain. Negative for myalgias and joint swelling.       Left flank pain  Skin: Negative for rash.       itching  Neurological: Positive for dizziness. Negative for seizures, syncope and speech difficulty.  Hematological: Positive for adenopathy. Does not bruise/bleed easily.  All other systems reviewed and are negative.   BP 146/92  mmHg  Pulse 110  Resp 20  Ht 5' 3.5" (1.613 m)  Wt 144 lb (65.318 kg)  BMI 25.11 kg/m2  SpO2 93%  LMP 12/27/1987 Physical Exam  Constitutional: She is oriented to person, place, and time. She appears well-developed and well-nourished. No distress.  HENT:  Head: Normocephalic and atraumatic.  Cardiovascular: Regular rhythm, normal heart sounds and intact distal pulses.  Exam reveals no gallop and no friction rub.   No murmur heard. Tachycardic  Pulmonary/Chest: Effort normal and breath sounds normal. She has no wheezes. She has no rales.  Abdominal: Soft. There is no tenderness.  + hepatomegaly  Musculoskeletal: She exhibits no edema.  Lymphadenopathy:    She has cervical adenopathy (tender adenopathy left supraclavicular and right anterior cervical).  Neurological: She is alert and oriented to person, place, and time. No cranial nerve deficit. Coordination normal.  No focal motor deficit  Skin: Skin is warm and dry.  Vitals reviewed.    Diagnostic Tests: CT CHEST WITH CONTRAST  TECHNIQUE: Multidetector CT imaging of the chest was performed during intravenous contrast administration.  CONTRAST: 66mL ISOVUE-300 IOPAMIDOL (ISOVUE-300) INJECTION 61%  COMPARISON: No priors. CT the abdomen and pelvis 12/17/2015.  FINDINGS: Mediastinum/Lymph Nodes: Heart size is normal. There is no significant pericardial fluid, thickening or pericardial calcification. Multiple enlarged mediastinal and bilateral hilar lymph nodes, measuring up to 23 mm in short axis in the  subcarinal nodal station, 13 mm in the low right paratracheal nodal station, 15 mm in the AP window, and 13 mm in the right hilar region. Esophagus is unremarkable in appearance. No axillary lymphadenopathy.  Lungs/Pleura: Calcified granuloma in the left lower lobe. 8 x 6 mm smoothly marginated nodule in the right middle lobe abutting the minor fissure (image 25 of series 4). A few other scattered 2-4 mm pulmonary nodules are noted, but are nonspecific. Some areas of pleural thickening are noted in the thorax bilaterally, most evident in the base of the right hemithorax (image 36 of series 3), where there is a noncalcified pleural plaque, and in the anterior aspect of the left hemithorax (image 25 of series 3) where there is a densely calcified left-sided pleural plaque. No calcified right-sided pleural plaques are identified at this time. No acute consolidative airspace disease. No pleural effusions.  Upper Abdomen: Unremarkable.  Musculoskeletal/Soft Tissues: There are no aggressive appearing lytic or blastic lesions noted in the visualized portions of the skeleton. Orthopedic fixation hardware in the lower cervical spine incidentally noted.  IMPRESSION: 1. Extensive mediastinal and bilateral hilar lymphadenopathy. Findings are concerning for potential lymphoproliferative disorder. Less likely, findings could be seen in the setting of a primary bronchogenic malignancy such as a small cell carcinoma. Further evaluation with PET-CT and biopsy is suggested for diagnostic and staging purposes in the near future. 2. 8 x 6 mm smoothly marginated pulmonary nodule in the right middle lobe associated with the minor fissure. This is nonspecific, but favored to be a subpleural lymph node. Multiple other smaller 2-4 mm pulmonary nodules are also noted in the lungs bilaterally. Attention on future followup studies is recommended. 3. Bilateral pleural plaques. These are calcified in the  left hemithorax and noncalcified in the right hemithorax. These are presumably secondary to prior episodes of infection or pleural hemorrhage, potentially related to catamenial hemothorax in this patient with history of endometriosis.   Electronically Signed  By: Vinnie Langton M.D.  On: 01/04/2016 15:10  I personally reviewed the CT images and concur with the findings as noted above  Impression: 55 year old woman with mediastinal, hilar, supraclavicular, and cervical adenopathy in addition to the multiple small lung nodules. She is a lifelong nonsmoker. She has systemic symptoms of fatigue and malaise, fever, night sweats, anorexia and weight loss. Her symptoms and scans are highly suggestive of lymphoma. She needs a biopsy to make a definitive diagnosis.  I discussed to potential options for biopsy. One would be to do mediastinoscopy, the other would be to do a left supraclavicular node biopsy. She has palpable nodes in the left supraclavicular fossa which would be easy to access with less risk of complications. I would favor that approach initially. If that is nondiagnostic we can always do a mediastinoscopy.  I discussed the option of proceeding with the biopsy to get a head start on the diagnosis versus waiting for the PET CT be done. Given that she has palpable nodes in an accessible location, I don't think that waiting for the PET CT be of any additional benefit in terms of selecting a site to biopsy. It still needs to be done to assess the extent of disease.  She also is scheduled to meet with IR next week regarding a liver biopsy.  I discussed the general nature of the procedure with her. She understands this would be an outpatient procedure done under local with intravenous sedation. There is no guarantee we will get a definitive diagnosis. The risks include reaction to a medication, bleeding, failure to make a diagnosis, wound infection and lymphocele.   We did briefly  discuss mediastinoscopy as second option if the supraclavicular nodes are non-diagnostic. She understands that is a procedure the requires general anesthesia and although there is a relatively low they could be potentially more serious such as major bleeding, stroke, pneumothorax, esophageal injury, or recurrent nerve injury leading to hoarseness.  Plan: Left supraclavicular lymph node biopsy on Thursday, 01/14/2016  Melrose Nakayama, MD [facility] [phone]

## 2016-01-12 ENCOUNTER — Other Ambulatory Visit: Payer: Self-pay | Admitting: *Deleted

## 2016-01-12 DIAGNOSIS — R590 Localized enlarged lymph nodes: Secondary | ICD-10-CM

## 2016-01-13 ENCOUNTER — Encounter (HOSPITAL_COMMUNITY): Payer: Self-pay | Admitting: *Deleted

## 2016-01-13 ENCOUNTER — Other Ambulatory Visit: Payer: Self-pay | Admitting: Hematology

## 2016-01-13 ENCOUNTER — Telehealth: Payer: Self-pay | Admitting: Family Medicine

## 2016-01-13 ENCOUNTER — Telehealth: Payer: Self-pay

## 2016-01-13 MED ORDER — LORAZEPAM 0.5 MG PO TABS: 0.5000 mg | ORAL_TABLET | Freq: Once | ORAL | Status: DC

## 2016-01-13 NOTE — Progress Notes (Signed)
Pt denies SOB, chest pain, and being under the care of a cardiologist. Pt denies having a stress test, echo and cardiac cath. Pt denies having a chest x ray and EKG within the last year. Pt had labs on 01/07/16 ( in epic). Pt made aware to stop otc vitamins, herbal medications ( peppermint oil), NSAID's. Pt made aware of diabetes protocol and stated that she regularly takes 5 units of Levemir at 10:00 P.M. Pt made aware to take only 4 units ( 80%) tonight and no oral diabetes medicine ( glipizide) on DOS. Pt made aware of protocol for BS <70 and > 220. Pt verbalized understanding of all pre-op instructions.

## 2016-01-13 NOTE — Telephone Encounter (Signed)
Card sent 

## 2016-01-13 NOTE — Telephone Encounter (Signed)
Pt called stating that her morning sugars have been from 140-170 range. Wants to know is she should up her insulin?

## 2016-01-13 NOTE — Anesthesia Preprocedure Evaluation (Addendum)
Anesthesia Evaluation  Patient identified by MRN, date of birth, ID band Patient awake    Reviewed: Allergy & Precautions, NPO status , Patient's Chart, lab work & pertinent test results  History of Anesthesia Complications Negative for: history of anesthetic complications  Airway Mallampati: II  TM Distance: >3 FB Neck ROM: Full    Dental  (+) Partial Upper   Pulmonary  Mediastinal lymphadenopathy   Pulmonary exam normal breath sounds clear to auscultation       Cardiovascular hypertension, Pt. on medications (-) angina(-) CAD and (-) Past MI Normal cardiovascular exam Rhythm:Regular Rate:Normal     Neuro/Psych S/p ACDF negative psych ROS   GI/Hepatic negative GI ROS, Neg liver ROS,   Endo/Other  diabetes, Type 2, Insulin Dependent, Oral Hypoglycemic Agents  Renal/GU negative Renal ROS     Musculoskeletal  (+) Arthritis , Osteoarthritis,    Abdominal   Peds  Hematology negative hematology ROS (+)   Anesthesia Other Findings Day of surgery medications reviewed with the patient.  Reproductive/Obstetrics                           Anesthesia Physical Anesthesia Plan  ASA: III  Anesthesia Plan: MAC   Post-op Pain Management:    Induction: Intravenous  Airway Management Planned: Nasal Cannula  Additional Equipment:   Intra-op Plan:   Post-operative Plan:   Informed Consent: I have reviewed the patients History and Physical, chart, labs and discussed the procedure including the risks, benefits and alternatives for the proposed anesthesia with the patient or authorized representative who has indicated his/her understanding and acceptance.   Dental advisory given  Plan Discussed with: CRNA and Anesthesiologist  Anesthesia Plan Comments: (Discussed risks/benefits/alternatives to MAC sedation including need for ventilatory support, hypotension, need for conversion to general  anesthesia.  All patient questions answered.  Patient wished to proceed.)        Anesthesia Quick Evaluation

## 2016-01-13 NOTE — Telephone Encounter (Signed)
i reviewed the GI notes from recent and it appears her metformin was stopped.  So yes can go up 3 units every week until morning sugars are running 130 or less.    I read over her oncology notes.  Let her know we are thinking about her, praying for her.

## 2016-01-14 ENCOUNTER — Ambulatory Visit (HOSPITAL_COMMUNITY): Payer: BLUE CROSS/BLUE SHIELD | Admitting: Anesthesiology

## 2016-01-14 ENCOUNTER — Encounter (HOSPITAL_COMMUNITY)
Admission: RE | Disposition: A | Discharge status: Home / Self Care | Payer: Self-pay | Source: Ambulatory Visit | Attending: Thoracic Surgery (Cardiothoracic Vascular Surgery)

## 2016-01-14 ENCOUNTER — Ambulatory Visit (HOSPITAL_COMMUNITY): Payer: BLUE CROSS/BLUE SHIELD

## 2016-01-14 ENCOUNTER — Encounter (HOSPITAL_COMMUNITY): Payer: Self-pay

## 2016-01-14 ENCOUNTER — Ambulatory Visit (HOSPITAL_COMMUNITY)
Admission: RE | Admit: 2016-01-14 | Discharge: 2016-01-14 | Disposition: A | Discharge status: Home / Self Care | Payer: BLUE CROSS/BLUE SHIELD | Source: Ambulatory Visit | Attending: Thoracic Surgery (Cardiothoracic Vascular Surgery) | Admitting: Thoracic Surgery (Cardiothoracic Vascular Surgery)

## 2016-01-14 DIAGNOSIS — R918 Other nonspecific abnormal finding of lung field: Secondary | ICD-10-CM | POA: Insufficient documentation

## 2016-01-14 DIAGNOSIS — R599 Enlarged lymph nodes, unspecified: Secondary | ICD-10-CM | POA: Diagnosis present

## 2016-01-14 DIAGNOSIS — I10 Essential (primary) hypertension: Secondary | ICD-10-CM | POA: Diagnosis not present

## 2016-01-14 DIAGNOSIS — E119 Type 2 diabetes mellitus without complications: Secondary | ICD-10-CM | POA: Diagnosis not present

## 2016-01-14 DIAGNOSIS — I888 Other nonspecific lymphadenitis: Secondary | ICD-10-CM | POA: Diagnosis not present

## 2016-01-14 DIAGNOSIS — M1990 Unspecified osteoarthritis, unspecified site: Secondary | ICD-10-CM | POA: Insufficient documentation

## 2016-01-14 DIAGNOSIS — R911 Solitary pulmonary nodule: Secondary | ICD-10-CM | POA: Diagnosis not present

## 2016-01-14 DIAGNOSIS — Z794 Long term (current) use of insulin: Secondary | ICD-10-CM | POA: Diagnosis not present

## 2016-01-14 DIAGNOSIS — Z79899 Other long term (current) drug therapy: Secondary | ICD-10-CM | POA: Insufficient documentation

## 2016-01-14 DIAGNOSIS — E785 Hyperlipidemia, unspecified: Secondary | ICD-10-CM | POA: Insufficient documentation

## 2016-01-14 DIAGNOSIS — R590 Localized enlarged lymph nodes: Secondary | ICD-10-CM

## 2016-01-14 HISTORY — DX: Reserved for inherently not codable concepts without codable children: IMO0001

## 2016-01-14 HISTORY — PX: SUPRACLAVICAL NODE BIOPSY: SHX5165

## 2016-01-14 LAB — GLUCOSE, CAPILLARY
GLUCOSE-CAPILLARY: 175 mg/dL — AB (ref 65–99)
Glucose-Capillary: 206 mg/dL — ABNORMAL HIGH (ref 65–99)

## 2016-01-14 SURGERY — BIOPSY, LYMPH NODE, SUPRACLAVICULAR
Anesthesia: Monitor Anesthesia Care | Site: Chest | Laterality: Left

## 2016-01-14 MED ORDER — MIDAZOLAM HCL 2 MG/2ML IJ SOLN
INTRAMUSCULAR | Status: AC
  Filled 2016-01-14: qty 2

## 2016-01-14 MED ORDER — TRAMADOL HCL 50 MG PO TABS: 50.0000 mg | ORAL_TABLET | Freq: Four times a day (QID) | ORAL | Status: DC | PRN

## 2016-01-14 MED ORDER — PROPOFOL 10 MG/ML IV BOLUS
INTRAVENOUS | Status: AC
  Filled 2016-01-14: qty 40

## 2016-01-14 MED ORDER — ONDANSETRON HCL 4 MG/2ML IJ SOLN
INTRAMUSCULAR | Status: AC
  Filled 2016-01-14: qty 2

## 2016-01-14 MED ORDER — LIDOCAINE-EPINEPHRINE (PF) 1 %-1:200000 IJ SOLN
INTRAMUSCULAR | Status: DC | PRN
  Administered 2016-01-14: 16 mL

## 2016-01-14 MED ORDER — LACTATED RINGERS IV SOLN
INTRAVENOUS | Status: DC
  Administered 2016-01-14 (×2): via INTRAVENOUS

## 2016-01-14 MED ORDER — FENTANYL CITRATE (PF) 100 MCG/2ML IJ SOLN: 25.0000 ug | INTRAMUSCULAR | Status: DC | PRN

## 2016-01-14 MED ORDER — FENTANYL CITRATE (PF) 250 MCG/5ML IJ SOLN
INTRAMUSCULAR | Status: AC
  Filled 2016-01-14: qty 5

## 2016-01-14 MED ORDER — PROMETHAZINE HCL 25 MG/ML IJ SOLN: 6.2500 mg | INTRAMUSCULAR | Status: DC | PRN

## 2016-01-14 MED ORDER — DEXTROSE 5 % IV SOLN
1.5000 g | INTRAVENOUS | Status: AC
  Administered 2016-01-14: 1.5 g via INTRAVENOUS
  Filled 2016-01-14: qty 1.5

## 2016-01-14 MED ORDER — PHENYLEPHRINE 40 MCG/ML (10ML) SYRINGE FOR IV PUSH (FOR BLOOD PRESSURE SUPPORT)
PREFILLED_SYRINGE | INTRAVENOUS | Status: AC
  Filled 2016-01-14: qty 10

## 2016-01-14 MED ORDER — FENTANYL CITRATE (PF) 100 MCG/2ML IJ SOLN
INTRAMUSCULAR | Status: DC | PRN
  Administered 2016-01-14 (×3): 50 ug via INTRAVENOUS

## 2016-01-14 MED ORDER — ROCURONIUM BROMIDE 50 MG/5ML IV SOLN
INTRAVENOUS | Status: AC
  Filled 2016-01-14: qty 1

## 2016-01-14 MED ORDER — EPHEDRINE SULFATE 50 MG/ML IJ SOLN
INTRAMUSCULAR | Status: AC
  Filled 2016-01-14: qty 1

## 2016-01-14 MED ORDER — SUCCINYLCHOLINE CHLORIDE 20 MG/ML IJ SOLN
INTRAMUSCULAR | Status: AC
  Filled 2016-01-14: qty 1

## 2016-01-14 MED ORDER — PROPOFOL 500 MG/50ML IV EMUL
INTRAVENOUS | Status: DC | PRN
  Administered 2016-01-14: 100 ug/kg/min via INTRAVENOUS

## 2016-01-14 MED ORDER — MIDAZOLAM HCL 5 MG/5ML IJ SOLN
INTRAMUSCULAR | Status: DC | PRN
  Administered 2016-01-14: 2 mg via INTRAVENOUS

## 2016-01-14 MED ORDER — LIDOCAINE HCL (CARDIAC) 20 MG/ML IV SOLN
INTRAVENOUS | Status: AC
  Filled 2016-01-14: qty 5

## 2016-01-14 MED ORDER — PHENYLEPHRINE HCL 10 MG/ML IJ SOLN
INTRAMUSCULAR | Status: DC | PRN
  Administered 2016-01-14: 120 ug via INTRAVENOUS
  Administered 2016-01-14 (×2): 80 ug via INTRAVENOUS
  Administered 2016-01-14: 40 ug via INTRAVENOUS
  Administered 2016-01-14: 80 ug via INTRAVENOUS

## 2016-01-14 MED ORDER — SODIUM CHLORIDE 0.9 % IJ SOLN
INTRAMUSCULAR | Status: AC
  Filled 2016-01-14: qty 10

## 2016-01-14 MED ORDER — LIDOCAINE-EPINEPHRINE (PF) 1 %-1:200000 IJ SOLN
INTRAMUSCULAR | Status: AC
  Filled 2016-01-14: qty 30

## 2016-01-14 MED ORDER — DEXTROSE 5 % IV SOLN
10.0000 mg | INTRAVENOUS | Status: DC | PRN
  Administered 2016-01-14: 15 ug/min via INTRAVENOUS

## 2016-01-14 MED ORDER — 0.9 % SODIUM CHLORIDE (POUR BTL) OPTIME
TOPICAL | Status: DC | PRN
  Administered 2016-01-14: 1000 mL

## 2016-01-14 SURGICAL SUPPLY — 37 items
CANISTER SUCTION 2500CC (MISCELLANEOUS) ×3 IMPLANT
CLIP TI MEDIUM 6 (CLIP) ×3 IMPLANT
CLIP TI WIDE RED SMALL 6 (CLIP) ×6 IMPLANT
CONT SPEC 4OZ CLIKSEAL STRL BL (MISCELLANEOUS) ×18 IMPLANT
COVER SURGICAL LIGHT HANDLE (MISCELLANEOUS) ×3 IMPLANT
DERMABOND ADVANCED (GAUZE/BANDAGES/DRESSINGS) ×2
DERMABOND ADVANCED .7 DNX12 (GAUZE/BANDAGES/DRESSINGS) ×1 IMPLANT
DRAPE CHEST BREAST 15X10 FENES (DRAPES) ×3 IMPLANT
ELECT CAUTERY BLADE 6.4 (BLADE) ×3 IMPLANT
ELECT REM PT RETURN 9FT ADLT (ELECTROSURGICAL) ×3
ELECTRODE REM PT RTRN 9FT ADLT (ELECTROSURGICAL) ×1 IMPLANT
GAUZE SPONGE 4X4 12PLY STRL (GAUZE/BANDAGES/DRESSINGS) ×3 IMPLANT
GLOVE EUDERMIC 7 POWDERFREE (GLOVE) ×3 IMPLANT
GLOVE SURG SIGNA 7.5 PF LTX (GLOVE) ×3 IMPLANT
GOWN STRL REUS W/ TWL LRG LVL3 (GOWN DISPOSABLE) ×1 IMPLANT
GOWN STRL REUS W/TWL LRG LVL3 (GOWN DISPOSABLE) ×2
GOWN STRL REUS W/TWL XL LVL3 (GOWN DISPOSABLE) ×3 IMPLANT
KIT BASIN OR (CUSTOM PROCEDURE TRAY) ×3 IMPLANT
KIT ROOM TURNOVER OR (KITS) ×3 IMPLANT
NEEDLE 22X1 1/2 (OR ONLY) (NEEDLE) ×3 IMPLANT
NS IRRIG 1000ML POUR BTL (IV SOLUTION) ×3 IMPLANT
PACK GENERAL/GYN (CUSTOM PROCEDURE TRAY) ×3 IMPLANT
PAD ARMBOARD 7.5X6 YLW CONV (MISCELLANEOUS) ×6 IMPLANT
SPONGE INTESTINAL PEANUT (DISPOSABLE) ×3 IMPLANT
SUT SILK 2 0 TIES 10X30 (SUTURE) ×3 IMPLANT
SUT SILK 3 0 (SUTURE) ×2
SUT SILK 3-0 18XBRD TIE 12 (SUTURE) ×1 IMPLANT
SUT VIC AB 2-0 CT1 27 (SUTURE)
SUT VIC AB 2-0 CT1 TAPERPNT 27 (SUTURE) IMPLANT
SUT VIC AB 3-0 SH 27 (SUTURE) ×2
SUT VIC AB 3-0 SH 27X BRD (SUTURE) ×1 IMPLANT
SUT VIC AB 3-0 X1 27 (SUTURE) IMPLANT
SUT VICRYL 4-0 PS2 18IN ABS (SUTURE) ×3 IMPLANT
SYR CONTROL 10ML LL (SYRINGE) ×3 IMPLANT
TOWEL OR 17X24 6PK STRL BLUE (TOWEL DISPOSABLE) ×3 IMPLANT
TOWEL OR 17X26 10 PK STRL BLUE (TOWEL DISPOSABLE) ×3 IMPLANT
WATER STERILE IRR 1000ML POUR (IV SOLUTION) ×3 IMPLANT

## 2016-01-14 NOTE — Interval H&P Note (Signed)
History and Physical Interval Note:  01/14/2016 8:05 AM  Bethany Webb  has presented today for surgery, with the diagnosis of ADENOPATHY LUNG MASS  The various methods of treatment have been discussed with the patient and family. After consideration of risks, benefits and other options for treatment, the patient has consented to  Procedure(s): SUPRACLAVICAL NODE BIOPSY (Left) as a surgical intervention .  The patient's history has been reviewed, patient examined, no change in status, stable for surgery.  I have reviewed the patient's chart and labs.  Questions were answered to the patient's satisfaction.     Melrose Nakayama

## 2016-01-14 NOTE — H&P (View-Only) (Signed)
PCP is Wyatt Haste, MD Referring Provider is Denita Lung, MD  Chief Complaint  Patient presents with  . Adenopathy    Surgical eval, Chest CT 01/04/2016, pending PET Scan on 01/21/16   . Lung Mass    HPI: 55 year old woman sent for consultation regarding mediastinal and hilar adenopathy.  [name] is a 55 year old woman who has been feeling poorly over the past several months. She first noted swelling in her feet. She went to see Dr. Redmond School. Multiple tests were done she was noted to have an abnormal liver panel. She had a CT abdomen done in late December which showed a fatty liver and also noted some pleural plaques in the chest. That led to a CT of the chest in early January which showed multiple small lung nodules as well as hilar and mediastinal adenopathy. She is scheduled to have a PET/CT on January 26.  In addition to swelling in her feet, she also complains of fatigue, decreased energy, poor appetite, a 20 pound weight loss over the past 3 months, dizzy spells, and shortness of breath with walking for 30 minutes or been on her feet for an hour to an hour and a half. She used to be able to do those things without any issues. She complains of constant back pain as well as a left chest/flank pain.  Zubrod Score: At the time of surgery this patient's most appropriate activity status/level should be described as: []     0    Normal activity, no symptoms [x]     1    Restricted in physical strenuous activity but ambulatory, able to do out light work []     2    Ambulatory and capable of self care, unable to do work activities, up and about >50 % of waking hours                              []     3    Only limited self care, in bed greater than 50% of waking hours []     4    Completely disabled, no self care, confined to bed or chair []     5    Moribund   Past Medical History  Diagnosis Date  . Hypertension   . Hyperlipidemia   . Allergy   . Supraspinatus tendonitis 2/12     Dr. Gladstone Lighter  . Biceps tendonitis 2/12    Dr. Gladstone Lighter  . History of mammogram 05/25/12    stable, no suspicious finding, extremely dense breast tissue  . S/P hysterectomy   . Neutropenia, unspecified ([place]) 02/2013    slight, likely ethnicity related  . 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 Diabetes mellitus     type II  . Pneumococcal vaccine refused 03/2013  . Refused influenza vaccine 03/2013  . Wears glasses   . History of blood transfusion 1988    hysterectomy    Past Surgical History  Procedure Laterality Date  . Breast reduction surgery  1994  . Abdominal hysterectomy  1990    and BSO; endometriosis  . Colonoscopy  03/06/12    sigmoid polyp; repeat 10 years; Dr. Tora Duck  . Anterior cervical decomp/discectomy fusion N/A 06/20/2013    Procedure: ANTERIOR CERVICAL DECOMPRESSION/DISCECTOMY FUSION 1 LEVEL;  Surgeon: Ophelia Charter, MD;  Location: [facility];  Service: Neurosurgery;  Laterality:  N/A;  C45 anterior cervical decompression with fusion interbody prothesis plating and bonegraft  . Carpal tunnel release Right 06/20/2013    Procedure: CARPAL TUNNEL RELEASE;  Surgeon: Ophelia Charter, MD;  Location: [facility];  Service: Neurosurgery;  Laterality: Right;  RIGHT carpal tunnel release  . Lung surgery  1987    endometriosis in lungs    Family History  Problem Relation Age of Onset  . Cancer Paternal Grandmother     breast  . Diabetes Other     maternal and paternal grandmother  . Heart disease Father 59    died of MI, CABG   . Multiple sclerosis Sister   . Asthma Brother   . Cancer Maternal Uncle     hematologic  . Hypertension Maternal Grandmother   . Cancer Maternal Grandfather     prostate  . Hypertension Maternal Grandfather     Social History Social History  Substance Use Topics  . Smoking status: Never Smoker   . Smokeless tobacco: Never Used  . Alcohol Use: 0.0  oz/week    0 Glasses of wine per week     Comment: occ wine, maybe 2-3 times years    Current Outpatient Prescriptions  Medication Sig Dispense Refill  . amLODipine (NORVASC) 10 MG tablet Take 1 tablet (10 mg total) by mouth daily. 90 tablet 3  . glipiZIDE (GLUCOTROL) 5 MG tablet Take 1 tablet (5 mg total) by mouth daily before breakfast. 90 tablet 2  . glucose blood test strip Test blood sugar twice daily. Pt uses one touch ultra 100 each 2  . ibuprofen (ADVIL,MOTRIN) 800 MG tablet Take 1 tablet (800 mg total) by mouth every 8 (eight) hours as needed. 30 tablet 0  . Insulin Detemir (LEVEMIR) 100 UNIT/ML Pen Inject 10 Units into the skin daily at 10 pm. 15 mL 5  . Lancets 30G MISC Test blood sugars twice daily 100 each 2  . lisinopril-hydrochlorothiazide (PRINZIDE,ZESTORETIC) 20-25 MG tablet Take 1 tablet by mouth daily. 90 tablet 3  . megestrol (MEGACE) 20 MG tablet Take 1 tablet (20 mg total) by mouth 2 (two) times daily. 60 tablet 2  . NON FORMULARY Reported on 01/05/2016    . peppermint oil liquid by Does not apply route as needed. Reported on 01/05/2016    . Prenatal Vit-Fe Fumarate-FA (PRENATAL VITAMIN) 27-0.8 MG TABS Take 1 tablet by mouth daily. 30 tablet 11   No current facility-administered medications for this visit.    Allergies  Allergen Reactions  . Codeine Nausea Only    Review of Systems  Constitutional: Positive for fever, chills, activity change, appetite change, fatigue and unexpected weight change (20 pounds in 3 months).  HENT: Negative for trouble swallowing.   Eyes: Positive for visual disturbance (blurry vision).  Respiratory: Positive for shortness of breath (with exertion, being on feet 1.5 hours, walking 30 minutes). Negative for cough and wheezing.   Cardiovascular: Positive for chest pain (with walking for 30 minutes) and leg swelling (feet). Negative for palpitations.  Gastrointestinal: Negative for abdominal pain and blood in stool.  Genitourinary:  Negative for dysuria and hematuria.  Musculoskeletal: Positive for back pain. Negative for myalgias and joint swelling.       Left flank pain  Skin: Negative for rash.       itching  Neurological: Positive for dizziness. Negative for seizures, syncope and speech difficulty.  Hematological: Positive for adenopathy. Does not bruise/bleed easily.  All other systems reviewed and are negative.   BP 146/92  mmHg  Pulse 110  Resp 20  Ht 5' 3.5" (1.613 m)  Wt 144 lb (65.318 kg)  BMI 25.11 kg/m2  SpO2 93%  LMP 12/27/1987 Physical Exam  Constitutional: She is oriented to person, place, and time. She appears well-developed and well-nourished. No distress.  HENT:  Head: Normocephalic and atraumatic.  Cardiovascular: Regular rhythm, normal heart sounds and intact distal pulses.  Exam reveals no gallop and no friction rub.   No murmur heard. Tachycardic  Pulmonary/Chest: Effort normal and breath sounds normal. She has no wheezes. She has no rales.  Abdominal: Soft. There is no tenderness.  + hepatomegaly  Musculoskeletal: She exhibits no edema.  Lymphadenopathy:    She has cervical adenopathy (tender adenopathy left supraclavicular and right anterior cervical).  Neurological: She is alert and oriented to person, place, and time. No cranial nerve deficit. Coordination normal.  No focal motor deficit  Skin: Skin is warm and dry.  Vitals reviewed.    Diagnostic Tests: CT CHEST WITH CONTRAST  TECHNIQUE: Multidetector CT imaging of the chest was performed during intravenous contrast administration.  CONTRAST: 43mL ISOVUE-300 IOPAMIDOL (ISOVUE-300) INJECTION 61%  COMPARISON: No priors. CT the abdomen and pelvis 12/17/2015.  FINDINGS: Mediastinum/Lymph Nodes: Heart size is normal. There is no significant pericardial fluid, thickening or pericardial calcification. Multiple enlarged mediastinal and bilateral hilar lymph nodes, measuring up to 23 mm in short axis in the  subcarinal nodal station, 13 mm in the low right paratracheal nodal station, 15 mm in the AP window, and 13 mm in the right hilar region. Esophagus is unremarkable in appearance. No axillary lymphadenopathy.  Lungs/Pleura: Calcified granuloma in the left lower lobe. 8 x 6 mm smoothly marginated nodule in the right middle lobe abutting the minor fissure (image 25 of series 4). A few other scattered 2-4 mm pulmonary nodules are noted, but are nonspecific. Some areas of pleural thickening are noted in the thorax bilaterally, most evident in the base of the right hemithorax (image 36 of series 3), where there is a noncalcified pleural plaque, and in the anterior aspect of the left hemithorax (image 25 of series 3) where there is a densely calcified left-sided pleural plaque. No calcified right-sided pleural plaques are identified at this time. No acute consolidative airspace disease. No pleural effusions.  Upper Abdomen: Unremarkable.  Musculoskeletal/Soft Tissues: There are no aggressive appearing lytic or blastic lesions noted in the visualized portions of the skeleton. Orthopedic fixation hardware in the lower cervical spine incidentally noted.  IMPRESSION: 1. Extensive mediastinal and bilateral hilar lymphadenopathy. Findings are concerning for potential lymphoproliferative disorder. Less likely, findings could be seen in the setting of a primary bronchogenic malignancy such as a small cell carcinoma. Further evaluation with PET-CT and biopsy is suggested for diagnostic and staging purposes in the near future. 2. 8 x 6 mm smoothly marginated pulmonary nodule in the right middle lobe associated with the minor fissure. This is nonspecific, but favored to be a subpleural lymph node. Multiple other smaller 2-4 mm pulmonary nodules are also noted in the lungs bilaterally. Attention on future followup studies is recommended. 3. Bilateral pleural plaques. These are calcified in the  left hemithorax and noncalcified in the right hemithorax. These are presumably secondary to prior episodes of infection or pleural hemorrhage, potentially related to catamenial hemothorax in this patient with history of endometriosis.   Electronically Signed  By: Vinnie Langton M.D.  On: 01/04/2016 15:10  I personally reviewed the CT images and concur with the findings as noted above  Impression: 55 year old woman with mediastinal, hilar, supraclavicular, and cervical adenopathy in addition to the multiple small lung nodules. She is a lifelong nonsmoker. She has systemic symptoms of fatigue and malaise, fever, night sweats, anorexia and weight loss. Her symptoms and scans are highly suggestive of lymphoma. She needs a biopsy to make a definitive diagnosis.  I discussed to potential options for biopsy. One would be to do mediastinoscopy, the other would be to do a left supraclavicular node biopsy. She has palpable nodes in the left supraclavicular fossa which would be easy to access with less risk of complications. I would favor that approach initially. If that is nondiagnostic we can always do a mediastinoscopy.  I discussed the option of proceeding with the biopsy to get a head start on the diagnosis versus waiting for the PET CT be done. Given that she has palpable nodes in an accessible location, I don't think that waiting for the PET CT be of any additional benefit in terms of selecting a site to biopsy. It still needs to be done to assess the extent of disease.  She also is scheduled to meet with IR next week regarding a liver biopsy.  I discussed the general nature of the procedure with her. She understands this would be an outpatient procedure done under local with intravenous sedation. There is no guarantee we will get a definitive diagnosis. The risks include reaction to a medication, bleeding, failure to make a diagnosis, wound infection and lymphocele.   We did briefly  discuss mediastinoscopy as second option if the supraclavicular nodes are non-diagnostic. She understands that is a procedure the requires general anesthesia and although there is a relatively low they could be potentially more serious such as major bleeding, stroke, pneumothorax, esophageal injury, or recurrent nerve injury leading to hoarseness.  Plan: Left supraclavicular lymph node biopsy on Thursday, 01/14/2016  Melrose Nakayama, MD [facility] [phone]

## 2016-01-14 NOTE — Anesthesia Postprocedure Evaluation (Signed)
Anesthesia Post Note  Patient: Bethany Webb  Procedure(s) Performed: Procedure(s) (LRB): LEFT SUPRACLAVICAL NODE BIOPSY (Left)  Patient location during evaluation: PACU Anesthesia Type: MAC Level of consciousness: awake and alert Pain management: pain level controlled Vital Signs Assessment: post-procedure vital signs reviewed and stable Respiratory status: spontaneous breathing, nonlabored ventilation, respiratory function stable and patient connected to nasal cannula oxygen Cardiovascular status: stable and blood pressure returned to baseline Anesthetic complications: no    Last Vitals:  Filed Vitals:   01/14/16 1000 01/14/16 1014  BP: 152/71 139/74  Pulse: 100 105  Temp: 36.7 C   Resp:  17    Last Pain:  Filed Vitals:   01/14/16 1015  PainSc: 0-No pain                 [name]

## 2016-01-14 NOTE — Brief Op Note (Signed)
01/14/2016  9:51 AM  PATIENT:  Bethany Webb  56 y.o. female  PRE-OPERATIVE DIAGNOSIS:  ADENOPATHY/ LUNG MASS  POST-OPERATIVE DIAGNOSIS:  ADENOPATHY/ LUNG MASS  PROCEDURE:  Procedure(s): LEFT SUPRACLAVICAL NODE BIOPSY (Left)  SURGEON:  Surgeon(s) and Role:    * Melrose Nakayama, MD - Primary  ANESTHESIA:   local and IV sedation  EBL:  Total I/O In: 1200 [I.V.:1200] Out: -   BLOOD ADMINISTERED:none  DRAINS: none   LOCAL MEDICATIONS USED:  LIDOCAINE  and Amount: 15 ml  SPECIMEN:  Source of Specimen:  multiple lymph nodes  DISPOSITION OF SPECIMEN:  PATHOLOGY  COUNTS:  YES  PLAN OF CARE: Discharge to home after PACU  PATIENT DISPOSITION:  PACU - hemodynamically stable.   Delay start of Pharmacological VTE agent (>24hrs) due to surgical blood loss or risk of bleeding: not applicable

## 2016-01-14 NOTE — Op Note (Signed)
Bethany Webb, Bethany Webb                ACCOUNT NO.:  [id number]  MEDICAL RECORD NO.:  [id number]  LOCATION:  [facility]                         FACILITY:  [facility]  PHYSICIAN:  Revonda Standard. Roxan Hockey, M.D.DATE OF BIRTH:  01/01/1961  DATE OF PROCEDURE:  01/14/2016 DATE OF DISCHARGE:  01/14/2016                              OPERATIVE REPORT   PREOPERATIVE DIAGNOSIS:  Lung nodules and widespread adenopathy.  POSTOPERATIVE DIAGNOSIS:  Lung nodules and widespread adenopathy.  PROCEDURE:  Left supraclavicular lymph node biopsy.  SURGEON:  Revonda Standard. Roxan Hockey, M.D.  ASSISTANT:  None.  ANESTHESIA:  Local with intravenous sedation.  FINDINGS:  Small nodes posterior to the external jugular.  Frozen section showed no carcinoma, larger nodes anterior to the external carotid sent for permanent pathology and lymphoma workup.  CLINICAL NOTE:  Bethany Webb is a 55 year old woman, who presents with complaints of fever, night sweats, anorexia, weight loss, and general malaise.  Workup revealed a lung nodule as well as mediastinal, hilar, supraclavicular and cervical adenopathy.  She had palpable nodes in the left supraclavicular  fossa and was advised to undergo biopsy of these nodes for diagnostic purposes.  The indications, risks, benefits, and alternatives were discussed in detail with the patient.  She understood and accepted the risks and agreed to proceed.  OPERATIVE NOTE:  Bethany Webb was brought to the operating room on January 14, 2016.  She was given intravenous sedation and monitored by the Anesthesia Service.  The neck and chest were prepped and draped in the usual sterile fashion.  1% lidocaine was used for local anesthesia, a total of 15 mL was used in all.  The patient did move and complain of pain with the initial injection of the local.  Her sedation was increased and she did not have any issues after that point.  An incision was made.  It was carried through the skin and subcutaneous  tissue.  The platysma was divided.  The external jugular vein was identified and preserved.  Just posterior to the external jugular vein, there were several small lymph nodes.  These were removed. One of nodes was sent for a frozen section, which revealed no evidence of carcinoma.  Anterior to the external jugular vein, the dissection was carried into a deeper plane and there was a large fat pad and multiple lymph nodes, one of which was particularly enlarged.  This fat pad was removed.  Small feeding branches were clipped or ligated and then divided.  The wound then was copiously irrigated with warm saline.  A final inspection was made for hemostasis. The platysma was closed with a 3-0 Vicryl suture and the skin was closed with 4-0 Vicryl subcuticular suture.  Dermabond was applied to the incision.  The patient was taken from the operating room to the postanesthetic care unit in good condition.     Revonda Standard Roxan Hockey, M.D.     SCH/MEDQ  D:  01/14/2016  T:  01/14/2016  Job:  [id number]

## 2016-01-14 NOTE — Anesthesia Procedure Notes (Signed)
Procedure Name: MAC Date/Time: 01/14/2016 8:13 AM Performed by: Salli Quarry Uziel Covault Pre-anesthesia Checklist: Patient identified, Emergency Drugs available, Suction available and Patient being monitored Patient Re-evaluated:Patient Re-evaluated prior to inductionOxygen Delivery Method: Nasal cannula

## 2016-01-14 NOTE — Discharge Instructions (Signed)
Do not drive or engage in heavy physical activity for 24 hours  You may resume normal activities tomorrow. You should resume activities gradually.  You may shower tomorrow  There is a medical adhesive over the incision. It will begin to peel off in 10-14 days.  You have a prescription for tramadol for pain relief. You may use as directed. You may use acetaminophen (tylenol) or ibuprofen (Advil, Motrin) in addition to, or instead of, the tramadol.  You may apply ice packs to the area for 20-30 minutes 4 times a day  My office will contact you with follow up information.  Call [phone] if you develop fever > 101 F, or have excessive swelling or redness around the incision or if note drainage from the incision.

## 2016-01-14 NOTE — Telephone Encounter (Signed)
LMTCB

## 2016-01-14 NOTE — Transfer of Care (Signed)
Immediate Anesthesia Transfer of Care Note  Patient: Bethany Webb  Procedure(s) Performed: Procedure(s): LEFT SUPRACLAVICAL NODE BIOPSY (Left)  Patient Location: PACU  Anesthesia Type:MAC  Level of Consciousness: awake, alert , oriented and patient cooperative  Airway & Oxygen Therapy: Patient Spontanous Breathing  Post-op Assessment: Report given to RN and Post -op Vital signs reviewed and stable  Post vital signs: Reviewed and stable  Last Vitals:  Filed Vitals:   01/14/16 0639  BP: 156/87  Pulse: 104  Temp: 36.9 C  Resp: 20    Complications: No apparent anesthesia complications

## 2016-01-15 ENCOUNTER — Telehealth: Payer: Self-pay | Admitting: Thoracic Surgery (Cardiothoracic Vascular Surgery)

## 2016-01-15 ENCOUNTER — Encounter (HOSPITAL_COMMUNITY): Payer: Self-pay | Admitting: Thoracic Surgery (Cardiothoracic Vascular Surgery)

## 2016-01-15 ENCOUNTER — Other Ambulatory Visit: Payer: Self-pay | Admitting: *Deleted

## 2016-01-15 MED ORDER — LORAZEPAM 0.5 MG PO TABS: 0.5000 mg | ORAL_TABLET | Freq: Once | ORAL | Status: DC

## 2016-01-15 NOTE — Telephone Encounter (Signed)
Pt is aware and starting the bump ups tonight. Is resting since she had her Bx yesterday. Stated she appreciates [name] and all we do

## 2016-01-15 NOTE — Telephone Encounter (Signed)
Called Bethany Webb to inform her of the pathology results. No malignancy see. + non-caseating granulomas- c/w sarcoidosis  She was relieved to learn there was no malignancy  Remo Lipps C. Roxan Hockey, MD [facility] [phone]

## 2016-01-18 ENCOUNTER — Telehealth: Payer: Self-pay

## 2016-01-18 DIAGNOSIS — D869 Sarcoidosis, unspecified: Secondary | ICD-10-CM

## 2016-01-18 NOTE — Telephone Encounter (Signed)
Referred to lb pulm

## 2016-01-18 NOTE — Telephone Encounter (Signed)
-----   Message from Carlena Hurl, PA-C sent at 01/18/2016  1:32 PM EST ----- Please let her know that we have receive copy of the pathology.  Its so good to see no hodgkin's or cancer.  Thus, go ahead and refer to Pulmonology ASAP as this appears to be Sarcoidosis.  Sarcoidosis is an inflammatory process, sometimes not requiring treatment, but sometimes is treated with steroids or other treatment recommendations.   See if oncology or surgeon advised any particular treatment since learning of the pathology results?  If not, refer to pulmonology.

## 2016-01-21 ENCOUNTER — Ambulatory Visit (HOSPITAL_BASED_OUTPATIENT_CLINIC_OR_DEPARTMENT_OTHER): Payer: BLUE CROSS/BLUE SHIELD

## 2016-01-21 ENCOUNTER — Ambulatory Visit (HOSPITAL_BASED_OUTPATIENT_CLINIC_OR_DEPARTMENT_OTHER): Payer: BLUE CROSS/BLUE SHIELD | Admitting: Hematology

## 2016-01-21 ENCOUNTER — Ambulatory Visit (HOSPITAL_COMMUNITY)
Admission: RE | Admit: 2016-01-21 | Discharge: 2016-01-21 | Disposition: A | Discharge status: Home / Self Care | Payer: BLUE CROSS/BLUE SHIELD | Source: Ambulatory Visit | Attending: Hematology | Admitting: Hematology

## 2016-01-21 ENCOUNTER — Telehealth: Payer: Self-pay | Admitting: Hematology

## 2016-01-21 ENCOUNTER — Encounter: Payer: Self-pay | Admitting: Hematology

## 2016-01-21 VITALS — BP 145/75 | HR 114 | Temp 98.6°F | Resp 18 | Ht 63.5 in | Wt 144.1 lb

## 2016-01-21 DIAGNOSIS — E069 Thyroiditis, unspecified: Secondary | ICD-10-CM

## 2016-01-21 DIAGNOSIS — R590 Localized enlarged lymph nodes: Secondary | ICD-10-CM

## 2016-01-21 DIAGNOSIS — R938 Abnormal findings on diagnostic imaging of other specified body structures: Secondary | ICD-10-CM | POA: Insufficient documentation

## 2016-01-21 DIAGNOSIS — D869 Sarcoidosis, unspecified: Secondary | ICD-10-CM | POA: Diagnosis not present

## 2016-01-21 DIAGNOSIS — R7989 Other specified abnormal findings of blood chemistry: Secondary | ICD-10-CM

## 2016-01-21 DIAGNOSIS — R591 Generalized enlarged lymph nodes: Secondary | ICD-10-CM | POA: Diagnosis not present

## 2016-01-21 DIAGNOSIS — R918 Other nonspecific abnormal finding of lung field: Secondary | ICD-10-CM | POA: Diagnosis not present

## 2016-01-21 DIAGNOSIS — D8689 Sarcoidosis of other sites: Secondary | ICD-10-CM

## 2016-01-21 DIAGNOSIS — D862 Sarcoidosis of lung with sarcoidosis of lymph nodes: Secondary | ICD-10-CM

## 2016-01-21 DIAGNOSIS — D860 Sarcoidosis of lung: Secondary | ICD-10-CM | POA: Insufficient documentation

## 2016-01-21 DIAGNOSIS — R209 Unspecified disturbances of skin sensation: Secondary | ICD-10-CM

## 2016-01-21 DIAGNOSIS — R945 Abnormal results of liver function studies: Secondary | ICD-10-CM

## 2016-01-21 LAB — CBC & DIFF AND RETIC
BASO%: 0.6 % (ref 0.0–2.0)
Basophils Absolute: 0 10*3/uL (ref 0.0–0.1)
EOS%: 8.5 % — AB (ref 0.0–7.0)
Eosinophils Absolute: 0.4 10*3/uL (ref 0.0–0.5)
HCT: 35.1 % (ref 34.8–46.6)
HGB: 11.9 g/dL (ref 11.6–15.9)
Immature Retic Fract: 1.7 % (ref 1.60–10.00)
LYMPH%: 28.5 % (ref 14.0–49.7)
MCH: 28.2 pg (ref 25.1–34.0)
MCHC: 33.9 g/dL (ref 31.5–36.0)
MCV: 83.2 fL (ref 79.5–101.0)
MONO#: 0.4 10*3/uL (ref 0.1–0.9)
MONO%: 8.5 % (ref 0.0–14.0)
NEUT%: 53.9 % (ref 38.4–76.8)
NEUTROS ABS: 2.5 10*3/uL (ref 1.5–6.5)
PLATELETS: 466 10*3/uL — AB (ref 145–400)
RBC: 4.22 10*6/uL (ref 3.70–5.45)
RDW: 14.6 % — ABNORMAL HIGH (ref 11.2–14.5)
Retic %: 1.96 % (ref 0.70–2.10)
Retic Ct Abs: 82.71 10*3/uL (ref 33.70–90.70)
WBC: 4.7 10*3/uL (ref 3.9–10.3)
lymph#: 1.3 10*3/uL (ref 0.9–3.3)

## 2016-01-21 LAB — COMPREHENSIVE METABOLIC PANEL
ALK PHOS: 630 U/L — AB (ref 40–150)
ALT: 95 U/L — ABNORMAL HIGH (ref 0–55)
ANION GAP: 12 meq/L — AB (ref 3–11)
AST: 76 U/L — ABNORMAL HIGH (ref 5–34)
Albumin: 3.4 g/dL — ABNORMAL LOW (ref 3.5–5.0)
BILIRUBIN TOTAL: 1.56 mg/dL — AB (ref 0.20–1.20)
BUN: 15.1 mg/dL (ref 7.0–26.0)
CO2: 29 meq/L (ref 22–29)
Calcium: 9.8 mg/dL (ref 8.4–10.4)
Chloride: 95 mEq/L — ABNORMAL LOW (ref 98–109)
Creatinine: 1 mg/dL (ref 0.6–1.1)
EGFR: 74 mL/min/{1.73_m2} — AB (ref >90)
GLUCOSE: 261 mg/dL — AB (ref 70–140)
POTASSIUM: 3.4 meq/L — AB (ref 3.5–5.1)
SODIUM: 137 meq/L (ref 136–145)
Total Protein: 8.1 g/dL (ref 6.4–8.3)

## 2016-01-21 LAB — GLUCOSE, CAPILLARY: GLUCOSE-CAPILLARY: 145 mg/dL — AB (ref 65–99)

## 2016-01-21 LAB — TSH: TSH: 0.387 m(IU)/L (ref 0.308–3.960)

## 2016-01-21 MED ORDER — ARTIFICIAL TEARS OP OINT: TOPICAL_OINTMENT | Freq: Every day | OPHTHALMIC | Status: DC

## 2016-01-21 MED ORDER — FLUDEOXYGLUCOSE F - 18 (FDG) INJECTION
7.1100 | Freq: Once | INTRAVENOUS | Status: AC | PRN
  Administered 2016-01-21: 7.11 via INTRAVENOUS

## 2016-01-21 MED ORDER — HYPROMELLOSE (GONIOSCOPIC) 2.5 % OP SOLN: 1.0000 [drp] | Freq: Four times a day (QID) | OPHTHALMIC | Status: DC | PRN

## 2016-01-21 NOTE — Telephone Encounter (Signed)
Pt confirmed labs/ov per 01/26 POF, gave pt AVS and Calendar.Cherylann Banas, sent msg to [facility] for [employer] approval, lft msg with PFT W/L to contact pt to schedule PFT before next visit.... KJ

## 2016-01-21 NOTE — Progress Notes (Signed)
[facility]  HEMATOLOGY ONCOLOGY PROGRESS NOTE  Date of service: .01/21/2016  Patient Care Team: Denita Lung, MD as PCP - General (Family Medicine)  Cc:  Extensive mediastinal and bilateral hilar lymphadenopathy and pulmonary nodules. Rule out lymphoproliferative syndrome Abnormal liver function tests  Current Treatment: diagnostic evaluation  INTERVAL HISTORY:  [name] is here for follow-up after having had her excisional lymph node biopsy with Dr. Roxan Hockey and her PET CT scan this morning. Her lymph node biopsy was consistent with a diagnosis sarcoidosis which would fit quite well with her overall clinical picture.  We discussed the results of her PET CT scan and biopsy in details. She notes some upper abdominal discomfort and lower back pain. Still having a lot of itching and fatigue and is keen to start treatment as soon as possible. She understands that some baseline studies should ideally be done prior to starting prednisone. She is understanding of this fact and agreeable to this plan. I did offer her starting prednisone immediately if she did not want to wait but that would not be ideal in the absence of threatening myocardial or CNS disease.  REVIEW OF SYSTEMS:    10 Point review of systems of done and is negative except as noted above.  . Past Medical History  Diagnosis Date  . Hypertension   . Hyperlipidemia   . Allergy   . Supraspinatus tendonitis 2/12    Dr. Gladstone Lighter  . Biceps tendonitis 2/12    Dr. Gladstone Lighter  . History of mammogram 05/25/12    stable, no suspicious finding, extremely dense breast tissue  . S/P hysterectomy   . Neutropenia, unspecified ([place]) 02/2013    slight, likely ethnicity related  . 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 Diabetes mellitus     type II  . Pneumococcal vaccine refused 03/2013  . Refused influenza vaccine 03/2013  . Wears glasses   . History of  blood transfusion 1988    hysterectomy  . Shortness of breath dyspnea     with exertion     . Past Surgical History  Procedure Laterality Date  . Breast reduction surgery  1994  . Abdominal hysterectomy  1990    and BSO; endometriosis  . Colonoscopy  03/06/12    sigmoid polyp; repeat 10 years; Dr. Tora Duck  . Anterior cervical decomp/discectomy fusion N/A 06/20/2013    Procedure: ANTERIOR CERVICAL DECOMPRESSION/DISCECTOMY FUSION 1 LEVEL;  Surgeon: Ophelia Charter, MD;  Location: [facility];  Service: Neurosurgery;  Laterality: N/A;  C45 anterior cervical decompression with fusion interbody prothesis plating and bonegraft  . Carpal tunnel release Right 06/20/2013    Procedure: CARPAL TUNNEL RELEASE;  Surgeon: Ophelia Charter, MD;  Location: [facility];  Service: Neurosurgery;  Laterality: Right;  RIGHT carpal tunnel release  . Lung surgery  1987    endometriosis in lungs  . Supraclavical node biopsy Left 01/14/2016    Procedure: LEFT SUPRACLAVICAL NODE BIOPSY;  Surgeon: Melrose Nakayama, MD;  Location: [facility];  Service: Thoracic;  Laterality: Left;    . Social History  Substance Use Topics  . Smoking status: Never Smoker   . Smokeless tobacco: Never Used  . Alcohol Use: 0.0 oz/week    0 Glasses of wine per week     Comment: occ wine, maybe 2-3 times years    ALLERGIES:  is allergic to codeine.  MEDICATIONS:  Current Outpatient  Prescriptions  Medication Sig Dispense Refill  . amLODipine (NORVASC) 10 MG tablet Take 1 tablet (10 mg total) by mouth daily. 90 tablet 3  . glipiZIDE (GLUCOTROL) 5 MG tablet Take 1 tablet (5 mg total) by mouth daily before breakfast. 90 tablet 2  . glucose blood test strip Test blood sugar twice daily. Pt uses one touch ultra 100 each 2  . ibuprofen (ADVIL,MOTRIN) 800 MG tablet Take 1 tablet (800 mg total) by mouth every 8 (eight) hours as needed. 30 tablet 0  . Insulin Detemir (LEVEMIR) 100 UNIT/ML Pen Inject 10 Units into the skin daily  at 10 pm. 15 mL 5  . Lancets 30G MISC Test blood sugars twice daily 100 each 2  . lisinopril-hydrochlorothiazide (PRINZIDE,ZESTORETIC) 20-25 MG tablet Take 1 tablet by mouth daily. 90 tablet 3  . traMADol (ULTRAM) 50 MG tablet Take 1-2 tablets (50-100 mg total) by mouth every 6 (six) hours as needed (pain). 30 tablet 0  . artificial tears (LACRILUBE) OINT ophthalmic ointment Place into both eyes at bedtime. 3.5 g 6  . hydroxypropyl methylcellulose / hypromellose (ISOPTO TEARS / GONIOVISC) 2.5 % ophthalmic solution Place 1 drop into both eyes 4 (four) times daily as needed for dry eyes. 15 mL 12   No current facility-administered medications for this visit.    PHYSICAL EXAMINATION: ECOG PERFORMANCE STATUS: 2 - Symptomatic, <50% confined to bed  . Filed Vitals:   01/21/16 1259  BP: 145/75  Pulse: 114  Temp: 98.6 F (37 C)  Resp: 18    Filed Weights   01/21/16 1259  Weight: 144 lb 1.6 oz (65.363 kg)   .Body mass index is 25.12 kg/(m^2).  GENERAL:alert, in no acute distress and comfortable SKIN: skin color, texture, turgor are normal, no rashes or significant lesions EYES: normal, conjunctiva are pink and non-injected, sclera clear OROPHARYNX:no exudate, no erythema and lips, buccal mucosa, and tongue normal  NECK: supple, no JVD, thyroid normal size, non-tender, without nodularity LYMPH:  no palpable lymphadenopathy in the cervical, axillary or inguinal LUNGS: clear to auscultation with normal respiratory effort HEART: regular rate & rhythm,  no murmurs and no lower extremity edema ABDOMEN: abdomen soft, non-tender, normoactive bowel sounds  Musculoskeletal: no cyanosis of digits and no clubbing  PSYCH: alert & oriented x 3 with fluent speech NEURO: no focal motor/sensory deficits  LABORATORY DATA:   I have reviewed the data as listed  . CBC Latest Ref Rng 01/07/2016 01/05/2016 11/26/2015  WBC 3.9 - 10.3 10e3/uL 4.8 4.4 3.4(L)  Hemoglobin 11.6 - 15.9 g/dL 12.7 12.7 12.9    Hematocrit 34.8 - 46.6 % 37.8 38.6 39.8  Platelets 145 - 400 10e3/uL 481(H) 491(H) 390    . CMP Latest Ref Rng 01/07/2016 01/05/2016 11/26/2015  Glucose 70 - 140 mg/dl 132 192(H) 161(H)  BUN 7.0 - 26.0 mg/dL 10.1 13 18   Creatinine 0.6 - 1.1 mg/dL 0.8 0.83 0.94  Sodium 136 - 145 mEq/L 138 137 136  Potassium 3.5 - 5.1 mEq/L 3.7 3.9 4.0  Chloride 98 - 110 mmol/L - 96(L) 96(L)  CO2 22 - 29 mEq/L 27 28 27   Calcium 8.4 - 10.4 mg/dL 10.0 9.8 9.3  Total Protein 6.4 - 8.3 g/dL 7.8 7.1 7.3  Total Bilirubin 0.20 - 1.20 mg/dL 1.51(H) 1.7(H) 1.8(H)  Alkaline Phos 40 - 150 U/L 662(H) 574(H) 370(H)  AST 5 - 34 U/L 80(H) 75(H) 69(H)  ALT 0 - 55 U/L 112(H) 102(H) 69(H)     RADIOGRAPHIC STUDIES: I have personally reviewed  the radiological images as listed and agreed with the findings in the report. Dg Chest 2 View  01/14/2016  CLINICAL DATA:  Preoperative evaluation.  Multifocal adenopathy. EXAM: CHEST  2 VIEW COMPARISON:  Chest radiograph June 18, 2013 and chest CT January 04, 2016 FINDINGS: There is a calcified granuloma in the left mid lung. The 8 x 6 mm nodular opacity in the right upper lobe is not well seen by radiography but is well seen on recent chest CT. There is no edema or consolidation. The heart size and pulmonary vascularity are normal. There is adenopathy in the aortopulmonary region and hilar regions bilaterally, appreciable by radiography but better seen on CT. There is mild degenerative change in the thoracic spine. There is postoperative change in the lower cervical region. IMPRESSION: Bilateral hilar and aortopulmonary window region adenopathy. Better appreciated on recent CT. Other areas of adenopathy are better seen on CT. Calcified granuloma on the left. No frank edema or consolidation. Postoperative change lower cervical spine. Electronically Signed   By: Lowella Grip III M.D.   On: 01/14/2016 08:00   Ct Chest W Contrast  01/04/2016  CLINICAL DATA:  55 year old female with  shortness of breath during exertion. Nonsmoker. History of endometriosis affecting the lungs status post lung surgery in the 1980s. Evaluate for potential lung mass. EXAM: CT CHEST WITH CONTRAST TECHNIQUE: Multidetector CT imaging of the chest was performed during intravenous contrast administration. CONTRAST:  32mL ISOVUE-300 IOPAMIDOL (ISOVUE-300) INJECTION 61% COMPARISON:  No priors.  CT the abdomen and pelvis 12/17/2015. FINDINGS: Mediastinum/Lymph Nodes: Heart size is normal. There is no significant pericardial fluid, thickening or pericardial calcification. Multiple enlarged mediastinal and bilateral hilar lymph nodes, measuring up to 23 mm in short axis in the subcarinal nodal station, 13 mm in the low right paratracheal nodal station, 15 mm in the AP window, and 13 mm in the right hilar region. Esophagus is unremarkable in appearance. No axillary lymphadenopathy. Lungs/Pleura: Calcified granuloma in the left lower lobe. 8 x 6 mm smoothly marginated nodule in the right middle lobe abutting the minor fissure (image 25 of series 4). A few other scattered 2-4 mm pulmonary nodules are noted, but are nonspecific. Some areas of pleural thickening are noted in the thorax bilaterally, most evident in the base of the right hemithorax (image 36 of series 3), where there is a noncalcified pleural plaque, and in the anterior aspect of the left hemithorax (image 25 of series 3) where there is a densely calcified left-sided pleural plaque. No calcified right-sided pleural plaques are identified at this time. No acute consolidative airspace disease. No pleural effusions. Upper Abdomen: Unremarkable. Musculoskeletal/Soft Tissues: There are no aggressive appearing lytic or blastic lesions noted in the visualized portions of the skeleton. Orthopedic fixation hardware in the lower cervical spine incidentally noted. IMPRESSION: 1. Extensive mediastinal and bilateral hilar lymphadenopathy. Findings are concerning for potential  lymphoproliferative disorder. Less likely, findings could be seen in the setting of a primary bronchogenic malignancy such as a small cell carcinoma. Further evaluation with PET-CT and biopsy is suggested for diagnostic and staging purposes in the near future. 2. 8 x 6 mm smoothly marginated pulmonary nodule in the right middle lobe associated with the minor fissure. This is nonspecific, but favored to be a subpleural lymph node. Multiple other smaller 2-4 mm pulmonary nodules are also noted in the lungs bilaterally. Attention on future followup studies is recommended. 3. Bilateral pleural plaques. These are calcified in the left hemithorax and noncalcified in the right hemithorax.  These are presumably secondary to prior episodes of infection or pleural hemorrhage, potentially related to catamenial hemothorax in this patient with history of endometriosis. Electronically Signed   By: Vinnie Langton M.D.   On: 01/04/2016 15:10   Nm Pet Image Initial (pi) Skull Base To Thigh  01/21/2016  CLINICAL DATA:  Initial treatment strategy for mediastinal and hilar lymphadenopathy in the chest. EXAM: NUCLEAR MEDICINE PET SKULL BASE TO THIGH TECHNIQUE: 7.11 mCi F-18 FDG was injected intravenously. Full-ring PET imaging was performed from the skull base to thigh after the radiotracer. CT data was obtained and used for attenuation correction and anatomic localization. FASTING BLOOD GLUCOSE:  Value: 145 mg/dl COMPARISON:  CT chest 01/04/2016 and CT abdomen/pelvis 12/17/2015 FINDINGS: NECK Diffuse metabolic activity is noted in the right thyroid lobe and isthmus. This is most likely thyroiditis. A small lesion is noted in the right lobe on the CT scan but did not relate correlate with this activity. Thyroid ultrasound followup is suggested. There are small but metabolically active right-sided supraclavicular and subpectoral lymph nodes. The largest lymph node in the right retro clavicular area measures 9.5 mm and the SUV max is  10.4. Surgical changes noted in the left supraclavicular area from recent biopsy. There is a small amount of fluid and residual air in this area CHEST Bulky diffuse mediastinal and hilar lymphadenopathy is markedly hypermetabolic ranging from [id number] SUV. There is also internal mammary adenopathy on the right side. Small right-sided paraspinal nodules are also metabolically active. 8 mm right upper lobe pulmonary nodule is reactive with SUV max of 5.3. 6.5 mm subpleural right upper lobe pulmonary nodule has an SUV max of 5.5. Calcified pleural plaques are again demonstrated. No hypermetabolism. ABDOMEN/PELVIS No all abdominal/pelvic lymphadenopathy. The solid abdominal organs are unremarkable. No bowel lesions. No inguinal lymphadenopathy. SKELETON No focal hypermetabolic activity to suggest skeletal metastasis. IMPRESSION: 1. Hypermetabolic lymphadenopathy in the lower neck and chest along with small metabolically active pulmonary nodules. Findings most likely secondary to sarcoidosis. Small cell lung cancer and lymphoma are other possibilities but based on the biopsy results are unlikely. 2. Diffuse uptake in the right thyroid lobe and isthmus most likely due to thyroiditis. Recommend follow-up thyroid ultrasound examination. 3. Postoperative changes in the left supraclavicular area with some residual fluid and air. Would not expect this much air 6 days after surgery. Recommend clinical correlation. Electronically Signed   By: Marijo Sanes M.D.   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 -pulmonary and lymph nodes. She has obvious lung nodules, extensive mediastinal and bilateral hilar lymphadenopathy and has symptoms of dyspnea on exertion likely decline in lung function no PFTs are pending. 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 -Upper abdominal symptoms might  suggest potential gastrointestinal involvement. -Dry eyes and dry mouth suggestive of involvement of the lacrimal glands and potentially the slightly glands by sarcoidosis. -Previously had cutaneous involvement characterized by a rash on the back that has since spontaneously improved. Still has a fair amount of pruritus. -Had burning paresthesias in her lower extremities suggesting potential nerve involvement. -Significant constitutional symptoms including fatigue. -No overt focal neurological deficits. -likely thyroid involvement/thyroiditis. Plan  -Patient's multisystem involvement certainly suggests that she meets criteria to start treatment of sarcoidosis. -We will get baseline studies including echocardiogram, PFTs, TB quantiferon assay,RPR, fungal serologies for histoplasma. -TSH given concern for thyroid involvement on PET CT scan. -25-hydroxy vitamin D and  1, 25-hydroxy vitamin D levels given high risk for hypercalciuria, vitamin D deficiency and osteoporosis with sarcoidosis. -A bone density scan. -Aggressive treatment of vitamin D deficiency and consideration of bisphosphonates if presence of significant osteopenia/osteoporosis especially since patient will be on long-term steroids. -HIV antibody -Patient has a follow-up with gastroenterology in the next couple of days to follow-up on her abnormal liver functions. -She is in the process of setting up a pulmonologist appointment. Already has referral and she'll be calling to expedite this today. -Once baseline labs and workup done patient will likely need to be started on prednisone. Would start at about 60mg  daily for 2 weeks then 40 mg by mouth daily for about 4 weeks and then tapering down by about 5 mg every 4 weeks to a maintenance of 10-15 mg which would be continued for up to 12 months or so if all her parameters improved. -We'll likely need Bactrim prophylaxis. -Gastroenterology might consider the use of ursodiol the setting of  cholestatic LFTs likely due to sarcoidosis. -I will allow the pulmonary team to direct her treatment at this time but shall be available if any other questions or concerns arise.  Return to care with Dr. Irene Limbo in 3 months with CBC, CMP.Earlier if any other acute questions or concerns.  I spent 50 minutes counseling the patient face to face. The total time spent in the appointment was 60 minutes and more than 50% was on counseling and direct patient cares.    Sullivan Lone MD [place] AAHIVMS [facility] Hematology/Oncology Physician [facility]  (Office):       [phone] (Work cell):  [phone] (Fax):           [phone]

## 2016-01-22 LAB — RPR: RPR: NONREACTIVE

## 2016-01-22 LAB — VITAMIN D 25 HYDROXY (VIT D DEFICIENCY, FRACTURES): VIT D 25 HYDROXY: 15.2 ng/mL — AB (ref 30.0–100.0)

## 2016-01-22 LAB — HISTOPLASMA GAL'MANNAN AG UR: HISTOPLASMA GAL'MANNAN AG UR: 0 ng/mL (ref 0.00–0.49)

## 2016-01-22 LAB — HIV ANTIBODY (ROUTINE TESTING W REFLEX): HIV SCREEN 4TH GENERATION: NONREACTIVE

## 2016-01-23 LAB — HISTOPLASMA CAPSULATUM ABS.
HISTOPLASMA AB MYCELIAL CF: NEGATIVE
HISTOPLASMA MYCELIAL ID AB.: NEGATIVE
Histoplasma Ab Yeast CF: NEGATIVE

## 2016-01-25 ENCOUNTER — Encounter: Payer: Self-pay | Admitting: Pulmonary Disease

## 2016-01-25 ENCOUNTER — Ambulatory Visit (INDEPENDENT_AMBULATORY_CARE_PROVIDER_SITE_OTHER): Payer: BLUE CROSS/BLUE SHIELD | Admitting: Pulmonary Disease

## 2016-01-25 VITALS — BP 128/82 | HR 107 | Ht 64.0 in | Wt 142.0 lb

## 2016-01-25 DIAGNOSIS — Z23 Encounter for immunization: Secondary | ICD-10-CM

## 2016-01-25 DIAGNOSIS — D869 Sarcoidosis, unspecified: Secondary | ICD-10-CM | POA: Diagnosis not present

## 2016-01-25 LAB — HISTOPLASMA GAL'MANNAN AG SER: Histoplasma Gal'mannan Ag Ser: 0 ng/mL (ref 0.00–0.49)

## 2016-01-25 MED ORDER — PREDNISONE 20 MG PO TABS: 20.0000 mg | ORAL_TABLET | Freq: Every day | ORAL | Status: DC

## 2016-01-25 NOTE — Patient Instructions (Signed)
Prednisone 40 mg daily  Follow up in 4 weeks with Dr. Halford Chessman or Nurse Practitioner

## 2016-01-25 NOTE — Progress Notes (Signed)
   Subjective:    Patient ID: Bethany Webb, female    DOB: 08-Oct-1961, 55 y.o.   MRN: [id number]  HPI    Review of Systems  Constitutional: Positive for appetite change and unexpected weight change. Negative for fever.  HENT: Positive for trouble swallowing. Negative for congestion, dental problem, ear pain, nosebleeds, postnasal drip, rhinorrhea, sinus pressure, sneezing and sore throat.   Eyes: Negative for redness and itching.  Respiratory: Positive for shortness of breath. Negative for cough, chest tightness and wheezing.   Cardiovascular: Positive for chest pain ( present x 1.5 months). Negative for palpitations and leg swelling.  Gastrointestinal: Positive for abdominal pain. Negative for nausea and vomiting.  Genitourinary: Negative for dysuria.  Musculoskeletal: Positive for arthralgias. Negative for joint swelling.  Skin: Negative for rash ( itching).  Neurological: Negative for headaches.  Hematological: Does not bruise/bleed easily.  Psychiatric/Behavioral: Negative for dysphoric mood. The patient is not nervous/anxious.        Objective:   Physical Exam        Assessment & Plan:

## 2016-01-25 NOTE — Progress Notes (Signed)
Past medical history She  has a past medical history of Hypertension; Hyperlipidemia; Allergy; Supraspinatus tendonitis (2/12); Biceps tendonitis (2/12); History of mammogram (05/25/12); S/P hysterectomy; Neutropenia, unspecified ([place]) (02/2013); Abnormal MRI, cervical spine; Diabetes mellitus; Pneumococcal vaccine refused (03/2013); Refused influenza vaccine (03/2013); Wears glasses; History of blood transfusion (1988); and Shortness of breath dyspnea.  Past surgical history She  has past surgical history that includes Breast reduction surgery (1994); Abdominal hysterectomy (1990); Colonoscopy (03/06/12); Anterior cervical decomp/discectomy fusion (N/A, 06/20/2013); Carpal tunnel release (Right, 06/20/2013); Lung surgery (1987); and Supraclavical node biopsy (Left, 01/14/2016).  Family history Her family history includes Allergies in her sister; Asthma in her brother and brother; Cancer in her maternal grandfather, maternal uncle, and paternal grandmother; Diabetes in her other; Eczema in her sister; Heart disease (age of onset: 72) in her father; Hypertension in her maternal grandfather and maternal grandmother; Multiple sclerosis in her sister.  Social history She  reports that she has never smoked. She has never used smokeless tobacco. She reports that she drinks alcohol. She reports that she does not use illicit drugs.  Allergies  Allergen Reactions  . Codeine Nausea Only    Current Outpatient Prescriptions on File Prior to Visit  Medication Sig  . amLODipine (NORVASC) 10 MG tablet Take 1 tablet (10 mg total) by mouth daily.  Marland Kitchen artificial tears (LACRILUBE) OINT ophthalmic ointment Place into both eyes at bedtime.  Marland Kitchen glipiZIDE (GLUCOTROL) 5 MG tablet Take 1 tablet (5 mg total) by mouth daily before breakfast.  . glucose blood test strip Test blood sugar twice daily. Pt uses one touch ultra  . hydroxypropyl methylcellulose / hypromellose (ISOPTO TEARS / GONIOVISC) 2.5 % ophthalmic solution Place 1  drop into both eyes 4 (four) times daily as needed for dry eyes.  Marland Kitchen ibuprofen (ADVIL,MOTRIN) 800 MG tablet Take 1 tablet (800 mg total) by mouth every 8 (eight) hours as needed.  . Insulin Detemir (LEVEMIR) 100 UNIT/ML Pen Inject 10 Units into the skin daily at 10 pm.  . Lancets 30G MISC Test blood sugars twice daily  . lisinopril-hydrochlorothiazide (PRINZIDE,ZESTORETIC) 20-25 MG tablet Take 1 tablet by mouth daily.  . traMADol (ULTRAM) 50 MG tablet Take 1-2 tablets (50-100 mg total) by mouth every 6 (six) hours as needed (pain).   No current facility-administered medications on file prior to visit.   Review of Systems  Constitutional: Positive for appetite change and unexpected weight change. Negative for fever.  HENT: Positive for trouble swallowing. Negative for congestion, dental problem, ear pain, nosebleeds, postnasal drip, rhinorrhea, sinus pressure, sneezing and sore throat.   Eyes: Negative for redness and itching.  Respiratory: Positive for shortness of breath. Negative for cough, chest tightness and wheezing.   Cardiovascular: Positive for chest pain ( present x 1.5 months). Negative for palpitations and leg swelling.  Gastrointestinal: Positive for abdominal pain. Negative for nausea and vomiting.  Genitourinary: Negative for dysuria.  Musculoskeletal: Positive for arthralgias. Negative for joint swelling.  Skin: Negative for rash ( itching).  Neurological: Negative for headaches.  Hematological: Does not bruise/bleed easily.  Psychiatric/Behavioral: Negative for dysphoric mood. The patient is not nervous/anxious.    Chief Complaint  Patient presents with  . Pulmonary Consult    Referred by Dr Glade Lloyd for Sarcoidosis. Recent CT/PET scan.     Tests CT chest 01/04/16 >> mediastinal and b/l hilar LAN, 8 mm RML nodule, b/l pleural plaques Labs 01/07/16 >> ESR 25, ACE < 15 Lt supraclavicular LN bx 01/14/16 >> non caseating granumola PET scan 01/21/16 >>  hypermetabolic LAN in  lower neck and chest  Vital signs BP 128/82 mmHg  Pulse 107  Ht '5\' 4"'  (1.626 m)  Wt 142 lb (64.411 kg)  BMI 24.36 kg/m2  SpO2 95%  LMP 12/27/1987  History of present illness CHANI GHANEM is a 55 y.o. female for evaluation of sarcoidosis.  She developed pain in her legs in November.  She had lab tests, and was found to have elevated LFT's.  She was having sweats, low grade fever, and had lost about 30 lbs.  She was feeling fatigued, and have cough and wheeze.  She was fine prior to November 2016.  She had CT abdomen/pelvis and was seen by [facility].  There was concern for lung findings on scan, and she had chest CT.  This showed lymphadenopathy and pulmonary nodules.  She was seen by thoracic surgery, and had LN biopsy.  This showed non caseating granuloma.  Fungal and AFB studies negative to date.  She never smoked.  She is from [place], but has lived in [place] for 10 yrs.  She works with [employer].  She worked in [employer] in the 1980's.  She denies animal exposures.  There is no hx of asthma, pneumonia, or tuberculosis.       Physical exam  General - No distress ENT - No sinus tenderness, no oral exudate, no LAN, no thyromegaly, TM clear, pupils equal/reactive Cardiac - s1s2 regular, no murmur, pulses symmetric Chest - No wheeze/rales/dullness, good air entry, normal respiratory excursion Back - No focal tenderness Abd - Soft, non-tender, no organomegaly, + bowel sounds Ext - No edema Neuro - Normal strength, cranial nerves intact Skin - No rashes Psych - Normal mood, and behavior   Nm Pet Image Initial (pi) Skull Base To Thigh  01/21/2016  CLINICAL DATA:  Initial treatment strategy for mediastinal and hilar lymphadenopathy in the chest. EXAM: NUCLEAR MEDICINE PET SKULL BASE TO THIGH TECHNIQUE: 7.11 mCi F-18 FDG was injected intravenously. Full-ring PET imaging was performed from the skull base to thigh after the radiotracer. CT data was obtained and used for  attenuation correction and anatomic localization. FASTING BLOOD GLUCOSE:  Value: 145 mg/dl COMPARISON:  CT chest 01/04/2016 and CT abdomen/pelvis 12/17/2015 FINDINGS: NECK Diffuse metabolic activity is noted in the right thyroid lobe and isthmus. This is most likely thyroiditis. A small lesion is noted in the right lobe on the CT scan but did not relate correlate with this activity. Thyroid ultrasound followup is suggested. There are small but metabolically active right-sided supraclavicular and subpectoral lymph nodes. The largest lymph node in the right retro clavicular area measures 9.5 mm and the SUV max is 10.4. Surgical changes noted in the left supraclavicular area from recent biopsy. There is a small amount of fluid and residual air in this area CHEST Bulky diffuse mediastinal and hilar lymphadenopathy is markedly hypermetabolic ranging from 49-67 SUV. There is also internal mammary adenopathy on the right side. Small right-sided paraspinal nodules are also metabolically active. 8 mm right upper lobe pulmonary nodule is reactive with SUV max of 5.3. 6.5 mm subpleural right upper lobe pulmonary nodule has an SUV max of 5.5. Calcified pleural plaques are again demonstrated. No hypermetabolism. ABDOMEN/PELVIS No all abdominal/pelvic lymphadenopathy. The solid abdominal organs are unremarkable. No bowel lesions. No inguinal lymphadenopathy. SKELETON No focal hypermetabolic activity to suggest skeletal metastasis. IMPRESSION: 1. Hypermetabolic lymphadenopathy in the lower neck and chest along with small metabolically active pulmonary nodules. Findings most likely secondary to  sarcoidosis. Small cell lung cancer and lymphoma are other possibilities but based on the biopsy results are unlikely. 2. Diffuse uptake in the right thyroid lobe and isthmus most likely due to thyroiditis. Recommend follow-up thyroid ultrasound examination. 3. Postoperative changes in the left supraclavicular area with some residual fluid  and air. Would not expect this much air 6 days after surgery. Recommend clinical correlation. Electronically Signed   By: Marijo Sanes M.D.   On: 01/21/2016 09:38    Lab Results  Component Value Date   WBC 4.7 01/21/2016   HGB 11.9 01/21/2016   HCT 35.1 01/21/2016   MCV 83.2 01/21/2016   PLT 466* 01/21/2016    Lab Results  Component Value Date   CREATININE 1.0 01/21/2016   BUN 15.1 01/21/2016   NA 137 01/21/2016   K 3.4* 01/21/2016   CL 96* 01/05/2016   CO2 29 01/21/2016    Lab Results  Component Value Date   ALT 95* 01/21/2016   AST 76* 01/21/2016   ALKPHOS 630* 01/21/2016   BILITOT 1.56* 01/21/2016    Lab Results  Component Value Date   TSH 0.387 01/21/2016    Discussion She has progressive fever, weight loss, fatigue, cough, wheeze, dyspnea, back pain, abdominal pain, and leg pains.  She was found to have elevated LFT's, mediastinal/hilar LAN with neck LAN.  She had tissue sampling of Lt SCN LAN >> this showed non caseating granuloma, and fungal/AFB studies were negative.  These findings are all consistent with systemic sarcoidosis.   Assessment/plan  Systemic sarcoidosis. 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 Plan: - f/u with [facility]  Diabetes mellitus. Plan: - PCP to adjust insulin regimen while she is on prednisone    Patient Instructions  Prednisone 40 mg daily  Follow up in 4 weeks with Dr. Halford Chessman or Nurse Practitioner     Chesley Mires, MD [place] Pager:  [phone]

## 2016-01-26 LAB — QUANTIFERON TB GOLD ASSAY (BLOOD)
QFT TB AG MINUS NIL VALUE: 0 [IU]/mL
QUANTIFERON NIL VALUE: 0.35 [IU]/mL
QUANTIFERON TB AG VALUE: 0.35 IU/mL
QUANTIFERON TB GOLD: NEGATIVE

## 2016-01-27 LAB — CALCITRIOL (1,25 DI-OH VIT D)
Vit D, 1,25-Dihydroxy: 30.1 pg/mL (ref 19.9–79.3)
Vit D, 1,25-Dihydroxy: 30.1 pg/mL (ref 19.9–79.3)

## 2016-01-28 ENCOUNTER — Telehealth: Payer: Self-pay | Admitting: Hematology

## 2016-01-28 NOTE — Telephone Encounter (Signed)
Lft msg for pt confirming echo/PFT scheduled for 02/03.... KJ

## 2016-01-29 ENCOUNTER — Ambulatory Visit (HOSPITAL_COMMUNITY)
Admission: RE | Admit: 2016-01-29 | Discharge: 2016-01-29 | Disposition: A | Discharge status: Home / Self Care | Payer: BLUE CROSS/BLUE SHIELD | Source: Ambulatory Visit | Attending: Hematology | Admitting: Hematology

## 2016-01-29 ENCOUNTER — Telehealth: Payer: Self-pay | Admitting: Medical

## 2016-01-29 DIAGNOSIS — E119 Type 2 diabetes mellitus without complications: Secondary | ICD-10-CM | POA: Diagnosis not present

## 2016-01-29 DIAGNOSIS — I10 Essential (primary) hypertension: Secondary | ICD-10-CM | POA: Insufficient documentation

## 2016-01-29 DIAGNOSIS — D862 Sarcoidosis of lung with sarcoidosis of lymph nodes: Secondary | ICD-10-CM | POA: Insufficient documentation

## 2016-01-29 DIAGNOSIS — D8689 Sarcoidosis of other sites: Secondary | ICD-10-CM | POA: Insufficient documentation

## 2016-01-29 DIAGNOSIS — E785 Hyperlipidemia, unspecified: Secondary | ICD-10-CM | POA: Diagnosis not present

## 2016-01-29 DIAGNOSIS — I340 Nonrheumatic mitral (valve) insufficiency: Secondary | ICD-10-CM | POA: Diagnosis not present

## 2016-01-29 LAB — PULMONARY FUNCTION TEST
DL/VA % PRED: 119 %
DL/VA: 5.72 ml/min/mmHg/L
DLCO COR: 17.49 ml/min/mmHg
DLCO cor % pred: 72 %
DLCO unc % pred: 68 %
DLCO unc: 16.63 ml/min/mmHg
FEF 25-75 PRE: 1.43 L/s
FEF 25-75 Post: 1.6 L/sec
FEF2575-%Change-Post: 11 %
FEF2575-%PRED-PRE: 65 %
FEF2575-%Pred-Post: 72 %
FEV1-%CHANGE-POST: -1 %
FEV1-%Pred-Post: 82 %
FEV1-%Pred-Pre: 83 %
FEV1-PRE: 1.81 L
FEV1-Post: 1.79 L
FEV1FVC-%Change-Post: 1 %
FEV1FVC-%Pred-Pre: 97 %
FEV6-%CHANGE-POST: -19 %
FEV6-%PRED-POST: 70 %
FEV6-%PRED-PRE: 86 %
FEV6-PRE: 2.31 L
FEV6-Post: 1.87 L
FEV6FVC-%PRED-PRE: 103 %
FEV6FVC-%Pred-Post: 103 %
FVC-%Change-Post: -2 %
FVC-%PRED-POST: 81 %
FVC-%Pred-Pre: 84 %
FVC-Post: 2.24 L
FVC-Pre: 2.31 L
POST FEV6/FVC RATIO: 100 %
PRE FEV6/FVC RATIO: 100 %
Post FEV1/FVC ratio: 80 %
Pre FEV1/FVC ratio: 78 %
RV % PRED: 71 %
RV: 1.38 L
TLC % PRED: 71 %
TLC: 3.62 L

## 2016-01-29 MED ORDER — ALBUTEROL SULFATE (2.5 MG/3ML) 0.083% IN NEBU
2.5000 mg | INHALATION_SOLUTION | Freq: Once | RESPIRATORY_TRACT | Status: AC
  Administered 2016-01-29: 2.5 mg via RESPIRATORY_TRACT

## 2016-01-29 NOTE — Telephone Encounter (Signed)
Pt has an appt on 2/10 about sugars running high. States that she wakes up and it is in the 200s. And wanted you to look at her neck to see if healing

## 2016-01-29 NOTE — Telephone Encounter (Signed)
Call and see how she is doing?  I know she saw pulmonology recently and I believe was started on steroid?    See if checking sugars as we will need to bump up insulin while on prednisone.

## 2016-01-29 NOTE — Progress Notes (Signed)
  Echocardiogram 2D Echocardiogram has been performed.  Bethany Webb 01/29/2016, 10:21 AM

## 2016-02-02 ENCOUNTER — Ambulatory Visit: Payer: Self-pay | Admitting: Gastroenterology

## 2016-02-05 ENCOUNTER — Ambulatory Visit (INDEPENDENT_AMBULATORY_CARE_PROVIDER_SITE_OTHER): Payer: BLUE CROSS/BLUE SHIELD | Admitting: Medical

## 2016-02-05 ENCOUNTER — Telehealth: Payer: Self-pay | Admitting: Medical

## 2016-02-05 ENCOUNTER — Encounter: Payer: Self-pay | Admitting: Medical

## 2016-02-05 VITALS — BP 108/70 | HR 95 | Wt 139.0 lb

## 2016-02-05 DIAGNOSIS — B379 Candidiasis, unspecified: Secondary | ICD-10-CM

## 2016-02-05 DIAGNOSIS — E1140 Type 2 diabetes mellitus with diabetic neuropathy, unspecified: Secondary | ICD-10-CM

## 2016-02-05 DIAGNOSIS — D869 Sarcoidosis, unspecified: Secondary | ICD-10-CM | POA: Diagnosis not present

## 2016-02-05 MED ORDER — INSULIN LISPRO PROT & LISPRO (75-25 MIX) 100 UNIT/ML ~~LOC~~ SUSP: 10.0000 [IU] | Freq: Two times a day (BID) | SUBCUTANEOUS | Status: DC

## 2016-02-05 MED ORDER — CANAGLIFLOZIN 100 MG PO TABS: 100.0000 mg | ORAL_TABLET | Freq: Every day | ORAL | Status: DC

## 2016-02-05 MED ORDER — FLUCONAZOLE 150 MG PO TABS: ORAL_TABLET | ORAL | Status: DC

## 2016-02-05 NOTE — Telephone Encounter (Signed)
Not sure if that is normal for this insulin?

## 2016-02-05 NOTE — Telephone Encounter (Signed)
Pt is aware and will shake the insulin until clumps dissolve

## 2016-02-05 NOTE — Progress Notes (Signed)
Subjective: Chief Complaint  Patient presents with  . sugars running high    wants you to also look at her neck. waking up to 230-270 sugars. is itching and having some discharge thinks it is a yeast infection.    Here for elevated sugars.   Recently diagnosed with sarcoidosis, has started daily prednisone with pulmonology and will likely be on this 6-8 weeks.  Sugars been running high 200s.  Lowest reading of late has been 230.  Still using glipizide daily and Levemir 10 u QHS.   Still feels fatigue, tired, hoping she will start getting better.   Recently since sugars been higher having white cheesy discharge like prior yeast infections.  Denies concern for STD, no new sexual partners.  No other aggravating or relieving factors. No other complaint.  Past Medical History  Diagnosis Date  . Hypertension   . Hyperlipidemia   . Allergy   . Supraspinatus tendonitis 2/12    Dr. Gladstone Lighter  . Biceps tendonitis 2/12    Dr. Gladstone Lighter  . History of mammogram 05/25/12    stable, no suspicious finding, extremely dense breast tissue  . S/P hysterectomy   . Neutropenia, unspecified ([place]) 02/2013    slight, likely ethnicity related  . 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 Diabetes mellitus     type II  . Pneumococcal vaccine refused 03/2013  . Refused influenza vaccine 03/2013  . Wears glasses   . History of blood transfusion 1988    hysterectomy  . Shortness of breath dyspnea     with exertion    ROS as in subjective  Objective: BP 108/70 mmHg  Pulse 95  Wt 139 lb (63.05 kg)  LMP 12/27/1987  Gen: wd, wn, nad GU declined  Assessment: Encounter Diagnoses  Name Primary?  . Type 2 diabetes mellitus with diabetic neuropathy, without long-term current use of insulin ([place]) Yes  . Sarcoidosis ([place])   . Yeast infection     Plan: Given elevated sugars, prednisone being on board for the next several weeks given  the sarcoidosis, will change the regimen today  Stop glipizide and Levemir for the time being.   Begin samples of Invokana daily and begin Humalog sample 72/25 pen 10 units BID, c/t diabetic diet.  She eats 2 meals daily.  Check glucose regularly.  Avoid hypoglycemia as discussed.  Call report in 1 week to give [name] update on sugar readings.   Begin Diflucan, can repeat in 1 wk.  Sarcoidosis - discussed her recent health issues, thankful diagnosis wasn't cancer, but c/t follow up with pulmonology  Bissie was seen today for sugars running high.  Diagnoses and all orders for this visit:  Type 2 diabetes mellitus with diabetic neuropathy, without long-term current use of insulin (HCC)  Sarcoidosis ([place])  Yeast infection  Other orders -     fluconazole (DIFLUCAN) 150 MG tablet; 1 tablet once, may repeat in 1 week if needed -     canagliflozin (INVOKANA) 100 MG TABS tablet; Take 1 tablet (100 mg total) by mouth daily before breakfast. -     insulin lispro protamine-lispro (HUMALOG 75/25 MIX) (75-25) 100 UNIT/ML SUSP injection; Inject 10 Units into the skin 2 (two) times daily with a meal.

## 2016-02-05 NOTE — Telephone Encounter (Signed)
Yes the mix insulin 75/25 should look cloudy, NOT CLUMPY, when mixed properly.

## 2016-02-05 NOTE — Patient Instructions (Signed)
For the time being:  STOP glipizide STOP Levemir  BEGIN Invokana oral tablet daily  BEGIN Humalog mix 75/25 insulin, using 10 units at lunch and dinner  Check sugars Call back in 1 week to give me an update on your readings   Hypoglycemia Hypoglycemia occurs when the glucose in your blood is too low. Glucose is a type of sugar that is your body's main energy source. Hormones, such as insulin and glucagon, control the level of glucose in the blood. Insulin lowers blood glucose and glucagon increases blood glucose. Having too much insulin in your blood stream, or not eating enough food containing sugar, can result in hypoglycemia. Hypoglycemia can happen to people with or without diabetes. It can develop quickly and can be a medical emergency.  CAUSES   Missing or delaying meals.  Not eating enough carbohydrates at meals.  Taking too much diabetes medicine.  Not timing your oral diabetes medicine or insulin doses with meals, snacks, and exercise.  Nausea and vomiting.  Certain medicines.  Severe illnesses, such as hepatitis, kidney disorders, and certain eating disorders.  Increased activity or exercise without eating something extra or adjusting medicines.  Drinking too much alcohol.  A nerve disorder that affects body functions like your heart rate, blood pressure, and digestion (autonomic neuropathy).  A condition where the stomach muscles do not function properly (gastroparesis). Therefore, medicines and food may not absorb properly.  Rarely, a tumor of the pancreas can produce too much insulin. SYMPTOMS   Hunger.  Sweating (diaphoresis).  Change in body temperature.  Shakiness.  Headache.  Anxiety.  Lightheadedness.  Irritability.  Difficulty concentrating.  Dry mouth.  Tingling or numbness in the hands or feet.  Restless sleep or sleep disturbances.  Altered speech and coordination.  Change in mental status.  Seizures or prolonged  convulsions.  Combativeness.  Drowsiness (lethargic).  Weakness.  Increased heart rate or palpitations.  Confusion.  Pale, gray skin color.  Blurred or double vision.  Fainting. DIAGNOSIS  A physical exam and medical history will be performed. Your caregiver may make a diagnosis based on your symptoms. Blood tests and other lab tests may be performed to confirm a diagnosis. Once the diagnosis is made, your caregiver will see if your signs and symptoms go away once your blood glucose is raised.  TREATMENT  Usually, you can easily treat your hypoglycemia when you notice symptoms.  Check your blood glucose. If it is less than 70 mg/dl, take one of the following:   3-4 glucose tablets.    cup juice.    cup regular soda.   1 cup skim milk.   -1 tube of glucose gel.   5-6 hard candies.   Avoid high-fat drinks or food that may delay a rise in blood glucose levels.  Do not take more than the recommended amount of sugary foods, drinks, gel, or tablets. Doing so will cause your blood glucose to go too high.   Wait 10-15 minutes and recheck your blood glucose. If it is still less than 70 mg/dl or below your target range, repeat treatment.   Eat a snack if it is more than 1 hour until your next meal.  There may be a time when your blood glucose may go so low that you are unable to treat yourself at home when you start to notice symptoms. You may need someone to help you. You may even faint or be unable to swallow. If you cannot treat yourself, someone will need to bring  you to the hospital.  [place]  If you have diabetes, follow your diabetes management plan by:  Taking your medicines as directed.  Following your exercise plan.  Following your meal plan. Do not skip meals. Eat on time.  Testing your blood glucose regularly. Check your blood glucose before and after exercise. If you exercise longer or different than usual, be sure to check blood  glucose more frequently.  Wearing your medical alert jewelry that says you have diabetes.  Identify the cause of your hypoglycemia. Then, develop ways to prevent the recurrence of hypoglycemia.  Do not take a hot bath or shower right after an insulin shot.  Always carry treatment with you. Glucose tablets are the easiest to carry.  If you are going to drink alcohol, drink it only with meals.  Tell friends or family members ways to keep you safe during a seizure. This may include removing hard or sharp objects from the area or turning you on your side.  Maintain a healthy weight. SEEK MEDICAL CARE IF:   You are having problems keeping your blood glucose in your target range.  You are having frequent episodes of hypoglycemia.  You feel you might be having side effects from your medicines.  You are not sure why your blood glucose is dropping so low.  You notice a change in vision or a new problem with your vision. SEEK IMMEDIATE MEDICAL CARE IF:   Confusion develops.  A change in mental status occurs.  The inability to swallow develops.  Fainting occurs.   This information is not intended to replace advice given to you by your health care provider. Make sure you discuss any questions you have with your health care provider.   Document Released: 12/12/2005 Document Revised: 12/17/2013 Document Reviewed: 08/18/2015 Elsevier Interactive Patient Education [employer].

## 2016-02-05 NOTE — Telephone Encounter (Signed)
Pt says the insulin that she was given today and told to start at noon is cloudy and white. Pt did not start the insulin since it looked that way and insulin she has previously was clear and she could see through it. Pt wants to be called back to let her know if this is a different insulin and should be cloudy.

## 2016-02-09 ENCOUNTER — Ambulatory Visit (INDEPENDENT_AMBULATORY_CARE_PROVIDER_SITE_OTHER): Payer: BLUE CROSS/BLUE SHIELD | Admitting: Medical

## 2016-02-09 ENCOUNTER — Encounter: Payer: Self-pay | Admitting: Medical

## 2016-02-09 VITALS — BP 118/78 | HR 92 | Wt 139.0 lb

## 2016-02-09 DIAGNOSIS — R202 Paresthesia of skin: Secondary | ICD-10-CM

## 2016-02-09 DIAGNOSIS — E118 Type 2 diabetes mellitus with unspecified complications: Secondary | ICD-10-CM | POA: Diagnosis not present

## 2016-02-09 DIAGNOSIS — T887XXA Unspecified adverse effect of drug or medicament, initial encounter: Secondary | ICD-10-CM

## 2016-02-09 DIAGNOSIS — Z794 Long term (current) use of insulin: Secondary | ICD-10-CM

## 2016-02-09 DIAGNOSIS — D869 Sarcoidosis, unspecified: Secondary | ICD-10-CM

## 2016-02-09 DIAGNOSIS — T50905A Adverse effect of unspecified drugs, medicaments and biological substances, initial encounter: Secondary | ICD-10-CM

## 2016-02-09 NOTE — Progress Notes (Signed)
Subjective: Chief Complaint  Patient presents with  . something in throat    said it feels like something is in her throat she felt like she couldnt breathe drunk a lot of water, it moved down and it painful. said that she can feel it moving from throat to chest. now it is in her breast area thinks it gas?   Was sitting at work today, felt sensation of gas or pain/pressure in neck, then seemed to go from neck to chest.  After drinking something felt the sensation subside. But later it recurred and is moving around.  Now feels it again in belly.   Slightly uncomfortable.  While in the exam room feels the sensation in different parts of her body.  Denies chest pain, but has SOB since the sarcoidosis diagnosis.   Denies problems with voiding or BMs.  No gas, no diarrhea.  No burping.  Doing 10 units BID of the mix Humalog 75/25.  Taking the [place].   Last visit we made a change to her diabetes regimen.   Sugars running kind of high still.   160-190 fasting glucose. No other aggravating or relieving factors. No other complaint.  Current Outpatient Prescriptions on File Prior to Visit  Medication Sig Dispense Refill  . amLODipine (NORVASC) 10 MG tablet Take 1 tablet (10 mg total) by mouth daily. 90 tablet 3  . artificial tears (LACRILUBE) OINT ophthalmic ointment Place into both eyes at bedtime. 3.5 g 6  . canagliflozin (INVOKANA) 100 MG TABS tablet Take 1 tablet (100 mg total) by mouth daily before breakfast. 30 tablet 2  . fluconazole (DIFLUCAN) 150 MG tablet 1 tablet once, may repeat in 1 week if needed 2 tablet 1  . glucose blood test strip Test blood sugar twice daily. Pt uses one touch ultra 100 each 2  . hydroxypropyl methylcellulose / hypromellose (ISOPTO TEARS / GONIOVISC) 2.5 % ophthalmic solution Place 1 drop into both eyes 4 (four) times daily as needed for dry eyes. 15 mL 12  . insulin lispro protamine-lispro (HUMALOG 75/25 MIX) (75-25) 100 UNIT/ML SUSP injection Inject 10 Units into the  skin 2 (two) times daily with a meal. 10 mL 0  . Lancets 30G MISC Test blood sugars twice daily 100 each 2  . lisinopril-hydrochlorothiazide (PRINZIDE,ZESTORETIC) 20-25 MG tablet Take 1 tablet by mouth daily. 90 tablet 3  . predniSONE (DELTASONE) 20 MG tablet Take 1 tablet (20 mg total) by mouth daily with breakfast. 60 tablet 3  . ibuprofen (ADVIL,MOTRIN) 800 MG tablet Take 1 tablet (800 mg total) by mouth every 8 (eight) hours as needed. (Patient not taking: Reported on 02/05/2016) 30 tablet 0  . Insulin Detemir (LEVEMIR) 100 UNIT/ML Pen Inject 10 Units into the skin daily at 10 pm. (Patient not taking: Reported on 02/09/2016) 15 mL 5  . traMADol (ULTRAM) 50 MG tablet Take 1-2 tablets (50-100 mg total) by mouth every 6 (six) hours as needed (pain). (Patient not taking: Reported on 02/05/2016) 30 tablet 0   No current facility-administered medications on file prior to visit.     Past Medical History  Diagnosis Date  . Hypertension   . Hyperlipidemia   . Allergy   . Supraspinatus tendonitis 2/12    Dr. Gladstone Lighter  . Biceps tendonitis 2/12    Dr. Gladstone Lighter  . History of mammogram 05/25/12    stable, no suspicious finding, extremely dense breast tissue  . S/P hysterectomy   . Neutropenia, unspecified ([place]) 02/2013    slight, likely ethnicity  related  . 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 Diabetes mellitus     type II  . Pneumococcal vaccine refused 03/2013  . Refused influenza vaccine 03/2013  . Wears glasses   . History of blood transfusion 1988    hysterectomy  . Shortness of breath dyspnea     with exertion    ROS as in subjective   Objective: BP 118/78 mmHg  Pulse 92  Wt 139 lb (63.05 kg)  LMP 12/27/1987  General appearance: alert, no distress, WD/WN HEENT: normocephalic, sclerae anicteric, TMs pearly, nares patent, no discharge or erythema, pharynx normal Oral cavity: MMM, no lesions Neck:  supple, no lymphadenopathy, no thyromegaly, no masses Heart: RRR, normal S1, S2, no murmurs Lungs: CTA bilaterally, no wheezes, rhonchi, or rales Neuro: CN2-12 intact, nonfocal exam Pulses: 2+ symmetric, upper and lower extremities, normal cap refill      Assessment: Encounter Diagnoses  Name Primary?  . Sarcoidosis ([place]) Yes  . Adverse effect of drug/medicinal, initial encounter   . Paresthesia   . Type 2 diabetes mellitus with complication, with long-term current use of insulin (HCC)     Plan: Her symptoms are unusual and seems most likely an advise effect of prednisone which she is taking daily given the sarcoidosis.   discussed other possibilities, but for now we decided together to move forward with watch and wait approach.  Can use Xanax she has at home prn for uneasiness, benadryl QHS for a few days.  If symptoms change or if new symptoms, then call or return.  No obvious symptom or sign of stroke or GERD or other differential today.

## 2016-02-19 ENCOUNTER — Other Ambulatory Visit: Payer: Self-pay | Admitting: Medical

## 2016-02-19 ENCOUNTER — Telehealth: Payer: Self-pay | Admitting: Medical

## 2016-02-19 MED ORDER — INSULIN LISPRO PROT & LISPRO (75-25 MIX) 100 UNIT/ML ~~LOC~~ SUSP: 10.0000 [IU] | Freq: Two times a day (BID) | SUBCUTANEOUS | Status: DC

## 2016-02-19 NOTE — Telephone Encounter (Signed)
I sent the mix insulin to pharmacy but call and make sure it went through.  If not, see if Novolog 70/30 would be covered/cheaper.

## 2016-02-19 NOTE — Telephone Encounter (Signed)
Pt said she will be out of Humalog sample on Sunday that she was given at her last appt and wants to know if she ins to Continue on that insulin or go back to previous insulin. Call pt at [phone]

## 2016-02-19 NOTE — Telephone Encounter (Signed)
Pt is aware.  

## 2016-02-22 ENCOUNTER — Other Ambulatory Visit (INDEPENDENT_AMBULATORY_CARE_PROVIDER_SITE_OTHER): Payer: BLUE CROSS/BLUE SHIELD

## 2016-02-22 ENCOUNTER — Encounter: Payer: Self-pay | Admitting: Adult Health

## 2016-02-22 ENCOUNTER — Encounter (INDEPENDENT_AMBULATORY_CARE_PROVIDER_SITE_OTHER): Payer: Self-pay

## 2016-02-22 ENCOUNTER — Ambulatory Visit (INDEPENDENT_AMBULATORY_CARE_PROVIDER_SITE_OTHER): Payer: BLUE CROSS/BLUE SHIELD | Admitting: Adult Health

## 2016-02-22 VITALS — BP 126/84 | HR 81 | Temp 98.3°F | Ht 64.0 in | Wt 140.0 lb

## 2016-02-22 DIAGNOSIS — D862 Sarcoidosis of lung with sarcoidosis of lymph nodes: Secondary | ICD-10-CM | POA: Diagnosis not present

## 2016-02-22 LAB — HEPATIC FUNCTION PANEL
ALK PHOS: 197 U/L — AB (ref 39–117)
ALT: 49 U/L — AB (ref 0–35)
AST: 26 U/L (ref 0–37)
Albumin: 4.2 g/dL (ref 3.5–5.2)
BILIRUBIN DIRECT: 0.2 mg/dL (ref 0.0–0.3)
BILIRUBIN TOTAL: 1.1 mg/dL (ref 0.2–1.2)
Total Protein: 7.5 g/dL (ref 6.0–8.3)

## 2016-02-22 NOTE — Assessment & Plan Note (Signed)
Systemic Sarcoid with lung, mediastinal adenopathy, liver involvement.  Subjective improvement  Will slowly go down on steroids  Check LFT today  May consider repeat CT chest in 3 months  PFT shows no airflow obstruction or restriction. Diffucing capacity is slightly decreased ,  Has pleural plaques , w/ previous pulmonary endometriosis hx .   Plan  Decrease Prednisone 20mg  1/2 daily for 2 weeks then 1/2 every other day for 2 weeks and stop.  Labs today .  follow up Dr. Halford Chessman  In 6 weeks and As needed   Please contact office for sooner follow up if symptoms do not improve or worsen or seek emergency care

## 2016-02-22 NOTE — Progress Notes (Signed)
Quick Note:  LVM for pt to return call ______ 

## 2016-02-22 NOTE — Progress Notes (Signed)
Subjective:    Patient ID: Bethany Webb, female    DOB: [birth year], 55 y.o.   MRN: [id number]  HPI 55 yo female never smoker seen for pulmonary consult 01/25/16 for Sarcoid  Hx of pulmonary endometriosis   TEST  CT chest 01/04/16 >> mediastinal and b/l hilar LAN, 8 mm RML nodule, b/l pleural plaques Labs 01/07/16 >> ESR 25, ACE < 15 Lt supraclavicular LN bx 01/14/16 >> non caseating [place] PET scan 03/24/91 >> hypermetabolic LAN in lower neck and chest  02/22/2016 Follow up Sarcoid  Pt returns for a 1 month follow up .  Recently dx with Sarcoid.  She developed pain in her legs in November. She had lab tests, and was found to have elevated LFT's. She was having sweats, low grade fever, and had lost about 30 lbs. She was feeling fatigued, and have cough and wheeze. She was fine prior to November 2016. She had CT abdomen/pelvis and was seen by [facility]. There was concern for lung findings on scan, and she had chest CT. This showed lymphadenopathy and pulmonary nodules. She was seen by thoracic surgery, and had LN biopsy. This showed non caseating granuloma. Fungal and AFB studies negative to date She was started on Prednisone 46m daily . She says she is feeling better, with less fatigue , side/back pain and sob. She has minimal dry cough. No rash.  Had eye doctor ov last week , good report.  Has DM and regimen recently adjusted due to steroid use. Says BS doing okay.  Steroid pt education given , advised prednisone adjustments can cause BS changes.  Does say she has some residual intermittent left leg weakness that comes and goes. None today.  No pain , back pain or loss of use.  Says appetite is better , weight is up 2 lbs.  PFT showed FEV1 83%, ratio 78, FVC 84%, DLCO 68%. No significant bronchodilator response. 2-D echo showed an EF of 542-68% grade 1 diastolic dysfunction. Pulmonary artery pressure 34 mmHg.   Past Medical History  Diagnosis Date  . Hypertension   .  Hyperlipidemia   . Allergy   . Supraspinatus tendonitis 2/12    Dr. GGladstone Lighter . Biceps tendonitis 2/12    Dr. GGladstone Lighter . History of mammogram 05/25/12    stable, no suspicious finding, extremely dense breast tissue  . S/P hysterectomy   . [place]    slight, likely ethnicity related  . 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 Diabetes mellitus     type II  . Pneumococcal vaccine refused 03/2013  . Refused influenza vaccine 03/2013  . Wears glasses   . History of blood transfusion 1988    hysterectomy  . Shortness of breath dyspnea     with exertion     Current Outpatient Prescriptions on File Prior to Visit  Medication Sig Dispense Refill  . amLODipine (NORVASC) 10 MG tablet Take 1 tablet (10 mg total) by mouth daily. 90 tablet 3  . canagliflozin (INVOKANA) 100 MG TABS tablet Take 1 tablet (100 mg total) by mouth daily before breakfast. 30 tablet 2  . glucose blood test strip Test blood sugar twice daily. Pt uses one touch ultra 100 each 2  . hydroxypropyl methylcellulose / hypromellose (ISOPTO TEARS / GONIOVISC) 2.5 % ophthalmic solution Place 1 drop into both eyes 4 (four) times daily as needed for dry eyes. 15  mL 12  . ibuprofen (ADVIL,MOTRIN) 800 MG tablet Take 1 tablet (800 mg total) by mouth every 8 (eight) hours as needed. 30 tablet 0  . insulin lispro protamine-lispro (HUMALOG 75/25 MIX) (75-25) 100 UNIT/ML SUSP injection Inject 10 Units into the skin 2 (two) times daily with a meal. (Patient taking differently: Inject 12-15 Units into the skin 2 (two) times daily with a meal. ) 10 mL 2  . Lancets 30G MISC Test blood sugars twice daily 100 each 2  . lisinopril-hydrochlorothiazide (PRINZIDE,ZESTORETIC) 20-25 MG tablet Take 1 tablet by mouth daily. 90 tablet 3  . predniSONE (DELTASONE) 20 MG tablet Take 1 tablet (20 mg total) by mouth daily with breakfast. 60 tablet 3  .  artificial tears (LACRILUBE) OINT ophthalmic ointment Place into both eyes at bedtime. (Patient not taking: Reported on 02/22/2016) 3.5 g 6  . fluconazole (DIFLUCAN) 150 MG tablet 1 tablet once, may repeat in 1 week if needed (Patient not taking: Reported on 02/22/2016) 2 tablet 1  . traMADol (ULTRAM) 50 MG tablet Take 1-2 tablets (50-100 mg total) by mouth every 6 (six) hours as needed (pain). (Patient not taking: Reported on 02/05/2016) 30 tablet 0   No current facility-administered medications on file prior to visit.         Review of Systems Constitutional:   No  weight loss, night sweats,  Fevers, chills, fatigue, or  lassitude.  HEENT:   No headaches,  Difficulty swallowing,  Tooth/dental problems, or  Sore throat,                No sneezing, itching, ear ache, nasal congestion, post nasal drip,   CV:  No chest pain,  Orthopnea, PND, swelling in lower extremities, anasarca, dizziness, palpitations, syncope.   GI  No heartburn, indigestion, abdominal pain, nausea, vomiting, diarrhea, change in bowel habits, loss of appetite, bloody stools.   Resp: No shortness of breath with exertion or at rest.  No excess mucus, no productive cough,  No non-productive cough,  No coughing up of blood.  No change in color of mucus.  No wheezing.  No chest wall deformity  Skin: no rash or lesions.  GU: no dysuria, change in color of urine, no urgency or frequency.  No flank pain, no hematuria   MS:  No joint pain or swelling.  No decreased range of motion.  No back pain.  Psych:  No change in mood or affect. No depression or anxiety.  No memory loss.         Objective:   Physical Exam  Filed Vitals:   02/22/16 0949  BP: 126/84  Pulse: 81  Temp: 98.3 F (36.8 C)  TempSrc: Oral  Height: _0  (1.626 m)  Weight: 140 lb (63.504 kg)  SpO2: 100%   GEN: A/Ox3; pleasant , NAD, well nourished   HEENT:  [place]/AT,  EACs-clear, TMs-wnl, NOSE-clear, THROAT-clear, no lesions, no postnasal drip or  exudate noted.   NECK:  Supple w/ fair ROM; no JVD; normal carotid impulses w/o bruits; no thyromegaly or nodules palpated; no lymphadenopathy.  RESP  Clear  P & A; w/o, wheezes/ rales/ or rhonchi.no accessory muscle use, no dullness to percussion  CARD:  RRR, no m/r/g  , no peripheral edema, pulses intact, no cyanosis or clubbing.  GI:   Soft & nt; nml bowel sounds; no organomegaly or masses detected.  Musco: Warm bil, no deformities or joint swelling noted.   Neuro: alert, no focal deficits noted.  Equal strength bilaterally  of LE . MAEWx 4 , nml gait.   Skin: Warm, no lesions or rashes        Assessment & Plan:

## 2016-02-22 NOTE — Patient Instructions (Signed)
Decrease Prednisone 20mg  1/2 daily for 2 weeks then 1/2 every other day for 2 weeks and stop.  Labs today .  follow up Dr. Halford Chessman  In 6 weeks and As needed   Please contact office for sooner follow up if symptoms do not improve or worsen or seek emergency care

## 2016-02-22 NOTE — Progress Notes (Signed)
Reviewed and agree with assessment/plan. 

## 2016-02-22 NOTE — Progress Notes (Signed)
Quick Note:  PT returned call. Reviewed results and recs. Pt voiced understanding and had no further questions. ______

## 2016-02-23 ENCOUNTER — Telehealth: Payer: Self-pay | Admitting: Medical

## 2016-03-01 ENCOUNTER — Ambulatory Visit (INDEPENDENT_AMBULATORY_CARE_PROVIDER_SITE_OTHER): Payer: BLUE CROSS/BLUE SHIELD | Admitting: Medical

## 2016-03-01 ENCOUNTER — Encounter: Payer: Self-pay | Admitting: Medical

## 2016-03-01 ENCOUNTER — Telehealth: Payer: Self-pay | Admitting: Medical

## 2016-03-01 VITALS — BP 110/68 | HR 102 | Wt 144.0 lb

## 2016-03-01 DIAGNOSIS — Z794 Long term (current) use of insulin: Secondary | ICD-10-CM

## 2016-03-01 DIAGNOSIS — K7689 Other specified diseases of liver: Secondary | ICD-10-CM

## 2016-03-01 DIAGNOSIS — R202 Paresthesia of skin: Secondary | ICD-10-CM | POA: Diagnosis not present

## 2016-03-01 DIAGNOSIS — E1141 Type 2 diabetes mellitus with diabetic mononeuropathy: Secondary | ICD-10-CM | POA: Diagnosis not present

## 2016-03-01 DIAGNOSIS — R200 Anesthesia of skin: Secondary | ICD-10-CM

## 2016-03-01 DIAGNOSIS — R945 Abnormal results of liver function studies: Secondary | ICD-10-CM

## 2016-03-01 DIAGNOSIS — D8689 Sarcoidosis of other sites: Secondary | ICD-10-CM

## 2016-03-01 NOTE — Telephone Encounter (Signed)
Refer to [facility] neurology for left arm and leg numbness

## 2016-03-01 NOTE — Telephone Encounter (Signed)
done

## 2016-03-01 NOTE — Progress Notes (Addendum)
Subjective: Chief Complaint  Patient presents with  . left leg numb    tingling and is all the way down to her feet. has FMLA forms to be filled out   Ms. Doty has hx/o diabetes, hypertension, hyperlipidemia, recent diagnosis of sarcoidosis, with recent several day hx/o left foot, left lower leg, left lower arm and hand numbness, tingling.   No vision or hearing changes, no other weakness, or paresthesias.  No gait changes, no speech changes. otherwise still feeling fatigued, trying to recover from sarcoidosis.    Also in the last week seeing  Sugars in the 400-500s whereas before she was having 160-300s.   No other aggravating or relieving factors.  She also has FMLA forms that need to be completed.  No other complaint.  Past Medical History  Diagnosis Date  . Hypertension   . Hyperlipidemia   . Allergy   . Supraspinatus tendonitis 2/12    Dr. Gladstone Lighter  . Biceps tendonitis 2/12    Dr. Gladstone Lighter  . History of mammogram 05/25/12    stable, no suspicious finding, extremely dense breast tissue  . S/P hysterectomy   . Neutropenia, unspecified ([place]) 02/2013    slight, likely ethnicity related  . 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 Diabetes mellitus     type II  . Pneumococcal vaccine refused 03/2013  . Refused influenza vaccine 03/2013  . Wears glasses   . History of blood transfusion 1988    hysterectomy  . Shortness of breath dyspnea     with exertion    ROS as in subjective   Objective: BP 110/68 mmHg  Pulse 102  Wt 144 lb (65.318 kg)  LMP 12/27/1987  General appearance: alert, no distress, WD/WN Neck: supple, no lymphadenopathy, no thyromegaly, no masses, no bruits Heart: RRR, normal S1, S2, no murmurs Lungs: CTA bilaterally, no wheezes, rhonchi, or rales Musculoskeletal: nontender, no swelling, no obvious deformity Extremities: no edema, no cyanosis, no clubbing Pulses: 1+ symmetric, upper  and lower extremities, normal cap refill Neurological: although she can feel monofilament and fine touch on left distal arm and left distal left, sensation is dull compared to right, left lower leg strength seems a slight bit decreased compared to the right.  Otherwise non focal exam, alert, oriented x 3, CN2-12 intact, DTRs 1+ throughout, no cerebellar signs, gait normal Psychiatric: normal affect, behavior normal, pleasant   Assessment: Encounter Diagnoses  Name Primary?  . Left sided numbness Yes  . Paresthesia   . Sarcoidosis of other sites [facility])   . Abnormal liver function   . Type 2 diabetes mellitus with diabetic mononeuropathy, with long-term current use of insulin (HCC)     Plan: Numbness - possible diabetes related but hard to say.   She has left stocking and glove pattern but unilateral. Referral to neurology  Increase to Novolog 70/30, 30 units BID, and can go up 3 units every 3 days until seeing mid 100s glucose.  Increase to invokana 300mg  daily, gave samples of Invokana.   Call report 2wk regarding glucose numbers.  Discussed monitoring and avoiding hypoglycemia  Sarcoidosis - on prednisone, followed by pulmonology  I will complete her FMLA forms.

## 2016-03-02 NOTE — Addendum Note (Signed)
Addended by: Carlena Hurl on: 03/02/2016 09:42 PM   Modules accepted: Level of Service

## 2016-03-07 ENCOUNTER — Telehealth: Payer: Self-pay

## 2016-03-07 MED ORDER — BLOOD GLUCOSE MONITOR KIT: PACK | Status: DC

## 2016-03-07 NOTE — Telephone Encounter (Signed)
Pt called because she needed refills on test strips but when she switched insurances she has to change to a different meter, sent in to her pharmacy

## 2016-03-08 ENCOUNTER — Other Ambulatory Visit: Payer: Self-pay | Admitting: Family Medicine

## 2016-03-08 MED ORDER — BLOOD GLUCOSE MONITOR KIT: PACK | Status: DC

## 2016-03-08 NOTE — Telephone Encounter (Signed)
Pt called she is in [place] and trying to get her meter from [employer].  She states [facility] doesn't have rx req.  So I called [facility] [place] [phone] held 15 min and pharmacist states we have to have name of meter.  So I called pt back and she states [employer].  So I called [facility] again and gave her [employer] Next. With lancets and strips.

## 2016-03-20 ENCOUNTER — Telehealth: Payer: Self-pay | Admitting: Medical

## 2016-03-20 NOTE — Telephone Encounter (Signed)
Last telephone call was closed and never completed, Cover my meds shows approved til 12/25/38

## 2016-03-24 ENCOUNTER — Ambulatory Visit (INDEPENDENT_AMBULATORY_CARE_PROVIDER_SITE_OTHER): Payer: BLUE CROSS/BLUE SHIELD | Admitting: Neurology

## 2016-03-24 ENCOUNTER — Encounter: Payer: Self-pay | Admitting: Neurology

## 2016-03-24 ENCOUNTER — Other Ambulatory Visit: Payer: Self-pay | Admitting: Medical

## 2016-03-24 VITALS — BP 110/70 | Ht 64.0 in | Wt 152.0 lb

## 2016-03-24 DIAGNOSIS — G63 Polyneuropathy in diseases classified elsewhere: Secondary | ICD-10-CM | POA: Diagnosis not present

## 2016-03-24 DIAGNOSIS — R202 Paresthesia of skin: Secondary | ICD-10-CM

## 2016-03-24 MED ORDER — GABAPENTIN 300 MG PO CAPS: ORAL_CAPSULE | ORAL | Status: DC

## 2016-03-24 NOTE — Telephone Encounter (Signed)
Is this okay to refill? 

## 2016-03-24 NOTE — Patient Instructions (Addendum)
1.  Start gabapentin 300mg  1 tablet at bedtime for one week, then increase to 1 tablet twice daily. 2.  You can try AsperCream and see if that helps 3.  Return to clinic in 2 months

## 2016-03-24 NOTE — Progress Notes (Signed)
[facility] Clinic Note - Initial Visit   Date: 03/24/2016  Bethany Webb MRN: [id number] DOB: [birth year]   Dear Bethany Bode, PA:  Thank you for your kind referral of Bethany Webb for consultation of left leg numbness. Although her history is well known to you, please allow Bethany Webb to reiterate it for the purpose of our medical record. The patient was accompanied to the clinic by self.    History of Present Illness: AANVI Bethany Webb is a 55 y.o. right-handed African American female with pulmonary sarcoidosis (12/2015), insulin-dependent diabetes, and hypertension presenting for evaluation of left leg numbness.  Starting in November 2016, she began having tenderness of the foot which she initially thought was gout.  In December, she began having 2-3 hour spells of numbness involving the entire left foot.  Later, she was found to have sarcoidosis confirmed by lymph node biopsy and started on prednisone.  Since being on prednisone, she noted mild improvement of the tenderness and the leg does not go numb as often as before.  Previously, it occurred every other day, where as now it occurs once per week.   Around February, she noticed burning pain over the dorsum and medial foot. Heat helps the pain.  She has noticed that closed top shoes are better.  She denies similar symptoms on the right foot.   No associated back pain.     Out-side paper records, electronic medical record, and images have been reviewed where available and summarized as:  Lab Results  Component Value Date   TSH 0.387 01/21/2016   Lab Results  Component Value Date   HGBA1C 8.8* 11/26/2015   Lab Results  Component Value Date   VITAMINB12 313 04/08/2010    Past Medical History  Diagnosis Date  . Hypertension   . Hyperlipidemia   . Allergy   . Supraspinatus tendonitis 2/12    Dr. Gladstone Lighter  . Biceps tendonitis 2/12    Dr. Gladstone Lighter  . History of mammogram 05/25/12    stable, no suspicious  finding, extremely dense breast tissue  . S/P hysterectomy   . Neutropenia, unspecified ([place]) 02/2013    slight, likely ethnicity related  . 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 Diabetes mellitus     type II  . Pneumococcal vaccine refused 03/2013  . Refused influenza vaccine 03/2013  . Wears glasses   . History of blood transfusion 1988    hysterectomy  . Shortness of breath dyspnea     with exertion     Past Surgical History  Procedure Laterality Date  . Breast reduction surgery  1994  . Abdominal hysterectomy  1990    and BSO; endometriosis  . Colonoscopy  03/06/12    sigmoid polyp; repeat 10 years; Dr. Tora Duck  . Anterior cervical decomp/discectomy fusion N/A 06/20/2013    Procedure: ANTERIOR CERVICAL DECOMPRESSION/DISCECTOMY FUSION 1 LEVEL;  Surgeon: Ophelia Charter, MD;  Location: [facility];  Service: Neurosurgery;  Laterality: N/A;  C45 anterior cervical decompression with fusion interbody prothesis plating and bonegraft  . Carpal tunnel release Right 06/20/2013    Procedure: CARPAL TUNNEL RELEASE;  Surgeon: Ophelia Charter, MD;  Location: [facility];  Service: Neurosurgery;  Laterality: Right;  RIGHT carpal tunnel release  . Lung surgery  1987    endometriosis in lungs  . Supraclavical node biopsy Left 01/14/2016    Procedure: LEFT SUPRACLAVICAL  NODE BIOPSY;  Surgeon: Melrose Nakayama, MD;  Location: [facility];  Service: Thoracic;  Laterality: Left;     Medications:  Outpatient Encounter Prescriptions as of 03/24/2016  Medication Sig  . amLODipine (NORVASC) 10 MG tablet Take 1 tablet (10 mg total) by mouth daily.  Marland Kitchen artificial tears (LACRILUBE) OINT ophthalmic ointment Place into both eyes at bedtime.  . blood glucose meter kit and supplies KIT Dispense based on patient and insurance preference. Use up to four times daily as directed. (FOR ICD-9 250.00, 250.01).  . canagliflozin  (INVOKANA) 100 MG TABS tablet Take 1 tablet (100 mg total) by mouth daily before breakfast.  . glucose blood test strip Test blood sugar twice daily. Pt uses one touch ultra  . hydroxypropyl methylcellulose / hypromellose (ISOPTO TEARS / GONIOVISC) 2.5 % ophthalmic solution Place 1 drop into both eyes 4 (four) times daily as needed for dry eyes.  Marland Kitchen ibuprofen (ADVIL,MOTRIN) 800 MG tablet Take 1 tablet (800 mg total) by mouth every 8 (eight) hours as needed.  . insulin aspart protamine- aspart (NOVOLOG MIX 70/30) (70-30) 100 UNIT/ML injection Inject 15 Units into the skin 2 (two) times daily at 10 AM and 5 PM.  . insulin lispro protamine-lispro (HUMALOG 75/25 MIX) (75-25) 100 UNIT/ML SUSP injection Inject 10 Units into the skin 2 (two) times daily with a meal.  . Lancets 30G MISC Test blood sugars twice daily  . lisinopril-hydrochlorothiazide (PRINZIDE,ZESTORETIC) 20-25 MG tablet Take 1 tablet by mouth daily.  . [DISCONTINUED] traMADol (ULTRAM) 50 MG tablet Take 1-2 tablets (50-100 mg total) by mouth every 6 (six) hours as needed (pain).  . [DISCONTINUED] predniSONE (DELTASONE) 20 MG tablet Take 1 tablet (20 mg total) by mouth daily with breakfast.   No facility-administered encounter medications on file as of 03/24/2016.     Allergies:  Allergies  Allergen Reactions  . Codeine Nausea Only    Family History: Family History  Problem Relation Age of Onset  . Cancer Paternal Grandmother     breast  . Diabetes Other     maternal and paternal grandmother  . Heart disease Father 38    died of MI, CABG   . Multiple sclerosis Sister   . Asthma Brother   . Cancer Maternal Uncle     hematologic  . Hypertension Maternal Grandmother   . Cancer Maternal Grandfather     prostate  . Hypertension Maternal Grandfather   . Asthma Brother   . Eczema Sister   . Allergies Sister     Social History: Social History  Substance Use Topics  . Smoking status: Never Smoker   . Smokeless tobacco:  Never Used  . 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  Lives alone in a one story home.  Education: 2-3 years of college.    Review of Systems:  CONSTITUTIONAL: No fevers, chills, night sweats, or weight loss.   EYES: No visual changes or eye pain ENT: No hearing changes.  No history of nose bleeds.   RESPIRATORY: No cough, wheezing and shortness of breath.   CARDIOVASCULAR: Negative for chest pain, and palpitations.   GI: Negative for abdominal discomfort, blood in stools or black stools.  No recent change in bowel habits.   GU:  No history of incontinence.   MUSCLOSKELETAL: No history of joint pain or swelling.  No myalgias.   SKIN: Negative for lesions, rash, and itching.   HEMATOLOGY/ONCOLOGY: Negative for prolonged bleeding, bruising easily, and swollen nodes.  No history of cancer.   ENDOCRINE: Negative for cold or heat intolerance, polydipsia or goiter.   PSYCH:  No depression or anxiety symptoms.   NEURO: As Above.   Vital Signs:  BP 110/70 mmHg  Ht _0  (1.626 m)  Wt 152 lb (68.947 kg)  BMI 26.08 kg/m2  LMP 12/27/1987   General Medical Exam:   General:  Well appearing, comfortable.   Eyes/ENT: see cranial nerve examination.   Neck: No masses appreciated.  Full range of motion without tenderness.  No carotid bruits. Respiratory:  Clear to auscultation, good air entry bilaterally.   Cardiac:  Regular rate and rhythm, no murmur.   Extremities:  No deformities, edema, or skin discoloration.  Skin:  No rashes or lesions.  Neurological Exam: MENTAL STATUS including orientation to time, place, person, recent and remote memory, attention span and concentration, language, and fund of knowledge is normal.  Speech is not dysarthric.  CRANIAL NERVES: II:  No  visual field defects.  Unremarkable fundi.   III-IV-VI: Pupils equal round and reactive to light.  Normal conjugate, extra-ocular eye movements in all directions of gaze.  No nystagmus.  Mild left ptosis.   V:  Normal facial sensation.     VII:  Normal facial symmetry and movements.   VIII:  Normal hearing and vestibular function.   IX-X:  Normal palatal movement.   XI:  Normal shoulder shrug and head rotation.   XII:  Normal tongue strength and range of motion, no deviation or fasciculation.  MOTOR:  No atrophy, fasciculations or abnormal movements.  No pronator drift.  Tone is normal.    Right Upper Extremity:    Left Upper Extremity:    Deltoid  5/5   Deltoid  5/5   Biceps  5/5   Biceps  5/5   Triceps  5/5   Triceps  5/5   Wrist extensors  5/5   Wrist extensors  5/5   Wrist flexors  5/5   Wrist flexors  5/5   Finger extensors  5/5   Finger extensors  5/5   Finger flexors  5/5   Finger flexors  5/5   Dorsal interossei  5/5   Dorsal interossei  5/5   Abductor pollicis  5/5   Abductor pollicis  5/5   Tone (Ashworth scale)  0  Tone (Ashworth scale)  0   Right Lower Extremity:    Left Lower Extremity:    Hip flexors  5/5   Hip flexors  5/5   Hip extensors  5/5   Hip extensors  5/5   Knee flexors  5/5   Knee flexors  5/5   Knee extensors  5/5   Knee extensors  5/5   Dorsiflexors  5/5   Dorsiflexors  5/5   Plantarflexors  5/5   Plantarflexors  5/5   Toe extensors  5/5   Toe extensors  5/5   Toe flexors  5/5   Toe flexors  5/5   Tone (Ashworth scale)  0  Tone (Ashworth scale)  0   MSRs:  Right  Left brachioradialis 2+  brachioradialis 2+  biceps 2+  biceps 2+  triceps 2+  triceps 2+  patellar 2+  patellar 2+  ankle jerk 2+  ankle jerk 2+  Hoffman no  Hoffman no  plantar response down  plantar response down   SENSORY:  Hyperesthesias to light touch, temperature, and pin prick over the entire left foot to the level of  the ankle.  Sensation in the upper extremities and right lower extremity is intact.  Romberg's sign absent.   COORDINATION/GAIT: Normal finger-to- nose-finger.  Intact rapid alternating movements bilaterally.  Able to rise from a chair without using arms.  Gait narrow based and stable. Tandem and stressed gait intact.    IMPRESSION: Ms. Hoffmann is a 55 year-old female with recently diagnosed sarcoidosis (12/2015) referred for evaluation of left foot paresthesias.  His neurological exam shows asymmetrical hyperesthesia involving the left foot without associated weakness or loss of reflexes. 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  I offered to perform NCS/EMG of the left leg to characterize the nature of her pain, but with the intensity of her pain, she will not be able to tolerate the testing.  We decided to hold on NCS/EMG for now and instead manage her pain.  She will start gabapentin 331m at bedtime for one week, then increase to 1 tablet twice daily.  She may also try OTC AsperCream to her foot.  Consider lidocaine ointment going forward.    I will see her back in 2 months, if no paresthesias have not improved or she develops weakness, electrodiagnostic testing will be the next step.  The duration of this appointment visit was 40 minutes of face-to-face time with the patient.  Greater than 50% of this time was spent in counseling, explanation of diagnosis, planning of further management, and coordination of care.   Thank you for allowing me to participate in patient's care.  If I can answer any additional questions, I would be pleased to do so.    Sincerely,    Rhiley Tarver K. PPosey Pronto DO

## 2016-04-21 ENCOUNTER — Telehealth: Payer: Self-pay | Admitting: Hematology

## 2016-04-21 NOTE — Telephone Encounter (Signed)
pt cld to CXappt-no r/s @ this time

## 2016-04-22 ENCOUNTER — Ambulatory Visit: Payer: BLUE CROSS/BLUE SHIELD | Admitting: Hematology

## 2016-04-22 ENCOUNTER — Ambulatory Visit (INDEPENDENT_AMBULATORY_CARE_PROVIDER_SITE_OTHER): Payer: BLUE CROSS/BLUE SHIELD | Admitting: Pulmonary Disease

## 2016-04-22 ENCOUNTER — Encounter: Payer: Self-pay | Admitting: Pulmonary Disease

## 2016-04-22 VITALS — BP 126/72 | HR 106 | Ht 64.0 in | Wt 150.0 lb

## 2016-04-22 DIAGNOSIS — D862 Sarcoidosis of lung with sarcoidosis of lymph nodes: Secondary | ICD-10-CM | POA: Diagnosis not present

## 2016-04-22 NOTE — Patient Instructions (Signed)
Follow up in 4 months 

## 2016-04-22 NOTE — Progress Notes (Signed)
Current Outpatient Prescriptions on File Prior to Visit  Medication Sig  . amLODipine (NORVASC) 10 MG tablet Take 1 tablet (10 mg total) by mouth daily.  Marland Kitchen artificial tears (LACRILUBE) OINT ophthalmic ointment Place into both eyes at bedtime.  . blood glucose meter kit and supplies KIT Dispense based on patient and insurance preference. Use up to four times daily as directed. (FOR ICD-9 250.00, 250.01).  . canagliflozin (INVOKANA) 100 MG TABS tablet Take 1 tablet (100 mg total) by mouth daily before breakfast.  . glucose blood test strip Test blood sugar twice daily. Pt uses one touch ultra  . hydroxypropyl methylcellulose / hypromellose (ISOPTO TEARS / GONIOVISC) 2.5 % ophthalmic solution Place 1 drop into both eyes 4 (four) times daily as needed for dry eyes.  Marland Kitchen ibuprofen (ADVIL,MOTRIN) 800 MG tablet Take 1 tablet (800 mg total) by mouth every 8 (eight) hours as needed.  . insulin aspart protamine- aspart (NOVOLOG MIX 70/30) (70-30) 100 UNIT/ML injection Inject 15 Units into the skin 2 (two) times daily at 10 AM and 5 PM.  . Lancets 30G MISC Test blood sugars twice daily  . lisinopril-hydrochlorothiazide (PRINZIDE,ZESTORETIC) 20-25 MG tablet Take 1 tablet by mouth daily.  Marland Kitchen NOVOLOG MIX 70/30 FLEXPEN (70-30) 100 UNIT/ML FlexPen INJECT 10 UNITS INTO THE SKIN TWICE DAILY WITH A MEAL   No current facility-administered medications on file prior to visit.     Chief Complaint  Patient presents with  . Follow-up    Pt states that breathing has been doing okay since last OV. Some days with wheezing. Denies SOB except with exertional activities. Pt is completely off Prednisone.     Tests CT chest 01/04/16 >> mediastinal and b/l hilar LAN, 8 mm RML nodule, b/l pleural plaques Labs 01/07/16 >> ESR 25, ACE < 15 Quantiferon Gold 01/21/16 >> negative Lt supraclavicular LN bx 01/14/16 >> non caseating [place]ola PET scan 01/06/72 >> hypermetabolic LAN in lower neck and chest PFT 01/29/16 >> FEV1 1.81  (83%), FEV1% 78, TLC 3.62 (71%), DLCO 68% Echo 01/29/16 >> EF 55 to 56%, grade 1 diastolic dysfx, mild MR, PAS 34 mmHg  Past medical hx HTN, diastolic CHF, HLD, DM  Past surgical hx, Allergies, Family hx, Social hx all reviewed.  Vital Signs BP 126/72 mmHg  Pulse 106  Ht '5\' 4"'  (1.626 m)  Wt 150 lb (68.04 kg)  BMI 25.73 kg/m2  SpO2 100%  LMP 12/27/1987  History of Present Illness Bethany Webb is a 55 y.o. 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  She has been off prednisone since March.  She is not having cough, sputum, chest pain, fever, skin rash, or hemoptysis.  She has some allergies and had a few episodes of wheeze >> none recently.  She went on a [employer] trip recently, and didn't notice any trouble with her breathing.  Physical Exam  General - No distress ENT - No sinus tenderness, no oral exudate, no LAN, MP 3 Cardiac - s1s2 regular, no murmur Chest - No wheeze/rales/dullness Back - No focal tenderness Abd - Soft, non-tender Ext - No edema Neuro - Normal strength Skin - No rashes Psych - normal mood, and behavior   Assessment/Plan  Systemic sarcoidosis. - respiratory status stable since stopping prednisone in March 2017 - monitor clinically - will discuss repeating CT chest at next follow up   Patient Instructions  Follow up in 4 months     Chesley Mires, MD [facility] Pager:  [phone] 04/22/2016, 4:17 PM

## 2016-04-25 ENCOUNTER — Other Ambulatory Visit: Payer: Self-pay | Admitting: Medical

## 2016-04-26 NOTE — Telephone Encounter (Signed)
Is this ok to refill?  

## 2016-05-05 ENCOUNTER — Ambulatory Visit (INDEPENDENT_AMBULATORY_CARE_PROVIDER_SITE_OTHER): Payer: BLUE CROSS/BLUE SHIELD | Admitting: Medical

## 2016-05-05 ENCOUNTER — Encounter: Payer: Self-pay | Admitting: Medical

## 2016-05-05 VITALS — BP 112/80 | HR 98 | Wt 150.0 lb

## 2016-05-05 DIAGNOSIS — E118 Type 2 diabetes mellitus with unspecified complications: Secondary | ICD-10-CM

## 2016-05-05 DIAGNOSIS — Z794 Long term (current) use of insulin: Secondary | ICD-10-CM | POA: Diagnosis not present

## 2016-05-05 DIAGNOSIS — D862 Sarcoidosis of lung with sarcoidosis of lymph nodes: Secondary | ICD-10-CM

## 2016-05-05 DIAGNOSIS — M79606 Pain in leg, unspecified: Secondary | ICD-10-CM

## 2016-05-05 NOTE — Patient Instructions (Signed)
Recommendations:  Temporarily stop Invokana, and go back to the Buckner you have left.  As long as you are not having nausea and diarrhea, then continue the Xigduo daily  Change the Novolog 70/30 pen to 30 units in the morning, 25 units in the evening  Continue to monitor sugars, write them in the log, and call or email early next week to give me an update   Avoid low sugars   Hypoglycemia Hypoglycemia occurs when the glucose in your blood is too low. Glucose is a type of sugar that is your body's main energy source. Hormones, such as insulin and glucagon, control the level of glucose in the blood. Insulin lowers blood glucose and glucagon increases blood glucose. Having too much insulin in your blood stream, or not eating enough food containing sugar, can result in hypoglycemia. Hypoglycemia can happen to people with or without diabetes. It can develop quickly and can be a medical emergency.  CAUSES   Missing or delaying meals.  Not eating enough carbohydrates at meals.  Taking too much diabetes medicine.  Not timing your oral diabetes medicine or insulin doses with meals, snacks, and exercise.  Nausea and vomiting.  Certain medicines.  Severe illnesses, such as hepatitis, kidney disorders, and certain eating disorders.  Increased activity or exercise without eating something extra or adjusting medicines.  Drinking too much alcohol.  A nerve disorder that affects body functions like your heart rate, blood pressure, and digestion (autonomic neuropathy).  A condition where the stomach muscles do not function properly (gastroparesis). Therefore, medicines and food may not absorb properly.  Rarely, a tumor of the pancreas can produce too much insulin. SYMPTOMS   Hunger.  Sweating (diaphoresis).  Change in body temperature.  Shakiness.  Headache.  Anxiety.  Lightheadedness.  Irritability.  Difficulty concentrating.  Dry mouth.  Tingling or numbness in the  hands or feet.  Restless sleep or sleep disturbances.  Altered speech and coordination.  Change in mental status.  Seizures or prolonged convulsions.  Combativeness.  Drowsiness (lethargic).  Weakness.  Increased heart rate or palpitations.  Confusion.  Pale, gray skin color.  Blurred or double vision.  Fainting. DIAGNOSIS  A physical exam and medical history will be performed. Your caregiver may make a diagnosis based on your symptoms. Blood tests and other lab tests may be performed to confirm a diagnosis. Once the diagnosis is made, your caregiver will see if your signs and symptoms go away once your blood glucose is raised.  TREATMENT  Usually, you can easily treat your hypoglycemia when you notice symptoms.  Check your blood glucose. If it is less than 70 mg/dl, take one of the following:   3-4 glucose tablets.    cup juice.    cup regular soda.   1 cup skim milk.   -1 tube of glucose gel.   5-6 hard candies.   Avoid high-fat drinks or food that may delay a rise in blood glucose levels.  Do not take more than the recommended amount of sugary foods, drinks, gel, or tablets. Doing so will cause your blood glucose to go too high.   Wait 10-15 minutes and recheck your blood glucose. If it is still less than 70 mg/dl or below your target range, repeat treatment.   Eat a snack if it is more than 1 hour until your next meal.  There may be a time when your blood glucose may go so low that you are unable to treat yourself at home when you  start to notice symptoms. You may need someone to help you. You may even faint or be unable to swallow. If you cannot treat yourself, someone will need to bring you to the hospital.  [place]  If you have diabetes, follow your diabetes management plan by:  Taking your medicines as directed.  Following your exercise plan.  Following your meal plan. Do not skip meals. Eat on time.  Testing your blood  glucose regularly. Check your blood glucose before and after exercise. If you exercise longer or different than usual, be sure to check blood glucose more frequently.  Wearing your medical alert jewelry that says you have diabetes.  Identify the cause of your hypoglycemia. Then, develop ways to prevent the recurrence of hypoglycemia.  Do not take a hot bath or shower right after an insulin shot.  Always carry treatment with you. Glucose tablets are the easiest to carry.  If you are going to drink alcohol, drink it only with meals.  Tell friends or family members ways to keep you safe during a seizure. This may include removing hard or sharp objects from the area or turning you on your side.  Maintain a healthy weight. SEEK MEDICAL CARE IF:   You are having problems keeping your blood glucose in your target range.  You are having frequent episodes of hypoglycemia.  You feel you might be having side effects from your medicines.  You are not sure why your blood glucose is dropping so low.  You notice a change in vision or a new problem with your vision. SEEK IMMEDIATE MEDICAL CARE IF:   Confusion develops.  A change in mental status occurs.  The inability to swallow develops.  Fainting occurs.   This information is not intended to replace advice given to you by your health care provider. Make sure you discuss any questions you have with your health care provider.   Document Released: 12/12/2005 Document Revised: 12/17/2013 Document Reviewed: 08/18/2015 Elsevier Interactive Patient Education [employer].

## 2016-05-05 NOTE — Progress Notes (Signed)
Subjective: Chief Complaint  Patient presents with  . blood sugars high    150-250 fasting, and during the day around 300. 35 unitswhen in 300s, 25 units when in 200s   Here for issues with blood sugar.  Taking Invokana 114m daily.   Taking Novolog 70/30 flex pen.   If sugars in the 200s pre meal, does 20-25 units, if sugars over 300, does 30-35 units.  Uses the insulin at 12pm and 6pm.    Checking sugars 3 times daily.    Morning numbers typically 150-250.  Before lunch gets 200-350.  Before dinner around 6am, getting around 300 sugars.   Stopped prednisone (sarcoidosis) about 3 weeks ago.   Eating breakfast, typically bowl of cereal, sometimes just eats peanut butter crackers or nothing for lunch, then eats dinner between 4-6pm.  Is consistent with diet, avoiding high sugar foods, sweets.  Past Medical History  Diagnosis Date  . Hypertension   . Hyperlipidemia   . Allergy   . Supraspinatus tendonitis 2/12    Dr. GGladstone Lighter . Biceps tendonitis 2/12    Dr. GGladstone Lighter . History of mammogram 05/25/12    stable, no suspicious finding, extremely dense breast tissue  . S/P hysterectomy   . Neutropenia, unspecified ([place] 02/2013    slight, likely ethnicity related  . 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 Diabetes mellitus     type II  . Pneumococcal vaccine refused 03/2013  . Refused influenza vaccine 03/2013  . Wears glasses   . History of blood transfusion 1988    hysterectomy  . Shortness of breath dyspnea     with exertion   . Sarcoidosis ([facility]    Current Outpatient Prescriptions on File Prior to Visit  Medication Sig Dispense Refill  . amLODipine (NORVASC) 10 MG tablet Take 1 tablet (10 mg total) by mouth daily. 90 tablet 3  . blood glucose meter kit and supplies KIT Dispense based on patient and insurance preference. Use up to four times daily as directed. (FOR ICD-9 250.00, 250.01). 1 each 0  .  canagliflozin (INVOKANA) 100 MG TABS tablet Take 1 tablet (100 mg total) by mouth daily before breakfast. 30 tablet 2  . glucose blood test strip Test blood sugar twice daily. Pt uses one touch ultra 100 each 2  . hydroxypropyl methylcellulose / hypromellose (ISOPTO TEARS / GONIOVISC) 2.5 % ophthalmic solution Place 1 drop into both eyes 4 (four) times daily as needed for dry eyes. 15 mL 12  . ibuprofen (ADVIL,MOTRIN) 800 MG tablet TAKE 1 TABLET(800 MG) BY MOUTH EVERY 8 HOURS AS NEEDED 30 tablet 0  . Lancets 30G MISC Test blood sugars twice daily 100 each 2  . lisinopril-hydrochlorothiazide (PRINZIDE,ZESTORETIC) 20-25 MG tablet Take 1 tablet by mouth daily. 90 tablet 3  . NOVOLOG MIX 70/30 FLEXPEN (70-30) 100 UNIT/ML FlexPen INJECT 10 UNITS INTO THE SKIN TWICE DAILY WITH A MEAL 15 mL 1   No current facility-administered medications on file prior to visit.    ROS as in subjective    Objective: BP 112/80 mmHg  Pulse 98  Wt 150 lb (68.04 kg)  LMP 12/27/1987  Gen: wd, wn, nad Skin unremarkable Legs nontender, no deformity, no swelling, ankle and knee ROM WNL Ext: no edema Pulses normal    Assessment: Encounter Diagnoses  Name Primary?  . Type 2 diabetes mellitus with complication, with long-term current use of insulin ([place] Yes  .  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 Leg pain, inferior, unspecified laterality       Plan: Change Novolog 70/30 to 30 units in the morning, 25 units in the evening.  Advised she don't skip meals, discussed diet recommendations, consistency, monitor sugars TID on the log sheet.  Call or e-mail early next week and we can adjust again.  Avoid hypoglycemia, discussed precautions, symptoms, and recommendations.  Sarcoidosis - c/t f/u with specialist  Leg pain - likely due to recent sudden increase in exercise with biking and walking.  Advised few days of rest, then restart more gradual with exercise.

## 2016-05-20 ENCOUNTER — Encounter: Payer: Self-pay | Admitting: Neurology

## 2016-05-20 ENCOUNTER — Ambulatory Visit (INDEPENDENT_AMBULATORY_CARE_PROVIDER_SITE_OTHER): Payer: BLUE CROSS/BLUE SHIELD | Admitting: Neurology

## 2016-05-20 VITALS — BP 128/84 | HR 113 | Ht 64.0 in | Wt 147.6 lb

## 2016-05-20 DIAGNOSIS — R202 Paresthesia of skin: Secondary | ICD-10-CM

## 2016-05-20 NOTE — Patient Instructions (Signed)
Return to clinic as needed

## 2016-05-20 NOTE — Progress Notes (Signed)
Follow-up Visit   Date: 05/20/2016    Bethany Webb MRN: [id number] DOB: [birth year]   Interim History: Bethany Webb is a 55 y.o. -handed African American female with pulmonary sarcoidosis (12/2015), insulin-dependent diabetes, and hypertension returning to the clinic for follow-up of left foot numbness.  The patient was accompanied to the clinic by self.  History of present illness: Starting in November 2016, she began having tenderness of the foot which she initially thought was gout. In December, she began having 2-3 hour spells of numbness involving the entire left foot. Later, she was found to have sarcoidosis confirmed by lymph node biopsy and started on prednisone. Since being on prednisone, she noted mild improvement of the tenderness and the leg does not go numb as often as before. Previously, it occurred every other day, where as now it occurs once per week. Around February, she noticed burning pain over the dorsum and medial foot. Heat helps the pain. She has noticed that closed top shoes are better. She denies similar symptoms on the right foot.   No associated back pain.   UPDATE 05/20/2016:   She discontinued gabapentin after a few doses because of cognitive side effects, but does endorse that it helped her paresthesias. Since adjusting her diabetic medications, her foot pain has significantly improved.  She no longer has tingling or sharp pain.  She is able to wear her usual shoes.    Medications:  Current Outpatient Prescriptions on File Prior to Visit  Medication Sig Dispense Refill  . amLODipine (NORVASC) 10 MG tablet Take 1 tablet (10 mg total) by mouth daily. 90 tablet 3  . blood glucose meter kit and supplies KIT Dispense based on patient and insurance preference. Use up to four times daily as directed. (FOR ICD-9 250.00, 250.01). 1 each 0  . canagliflozin (INVOKANA) 100 MG TABS tablet Take 1 tablet (100 mg total) by mouth daily before breakfast. 30 tablet  2  . glucose blood test strip Test blood sugar twice daily. Pt uses one touch ultra 100 each 2  . hydroxypropyl methylcellulose / hypromellose (ISOPTO TEARS / GONIOVISC) 2.5 % ophthalmic solution Place 1 drop into both eyes 4 (four) times daily as needed for dry eyes. 15 mL 12  . ibuprofen (ADVIL,MOTRIN) 800 MG tablet TAKE 1 TABLET(800 MG) BY MOUTH EVERY 8 HOURS AS NEEDED 30 tablet 0  . Lancets 30G MISC Test blood sugars twice daily 100 each 2  . lisinopril-hydrochlorothiazide (PRINZIDE,ZESTORETIC) 20-25 MG tablet Take 1 tablet by mouth daily. 90 tablet 3  . NOVOLOG MIX 70/30 FLEXPEN (70-30) 100 UNIT/ML FlexPen INJECT 10 UNITS INTO THE SKIN TWICE DAILY WITH A MEAL 15 mL 1   No current facility-administered medications on file prior to visit.    Allergies:  Allergies  Allergen Reactions  . Codeine Nausea Only  . Metformin And Related     Diarrhea, nausea    Review of Systems:  CONSTITUTIONAL: No fevers, chills, night sweats, or weight loss.  EYES: No visual changes or eye pain ENT: No hearing changes.  No history of nose bleeds.   RESPIRATORY: No cough, wheezing and shortness of breath.   CARDIOVASCULAR: Negative for chest pain, and palpitations.   GI: Negative for abdominal discomfort, blood in stools or black stools.  No recent change in bowel habits.   GU:  No history of incontinence.   MUSCLOSKELETAL: No history of joint pain or swelling.  No myalgias.   SKIN: Negative for lesions, rash, and itching.  ENDOCRINE: Negative for cold or heat intolerance, polydipsia or goiter.   PSYCH:  No depression or anxiety symptoms.   NEURO: As Above.   Vital Signs:  BP 128/84 mmHg  Pulse 113  Ht '5\' 4"'  (1.626 m)  Wt 147 lb 9 oz (66.934 kg)  BMI 25.32 kg/m2  SpO2 98%  LMP 12/27/1987  Neurological Exam: MENTAL STATUS including orientation to time, place, person, recent and remote memory, attention span and concentration, language, and fund of knowledge is normal.  Speech is not  dysarthric.  CRANIAL NERVES:  Pupils equal round and reactive to light.  Normal conjugate, extra-ocular eye movements in all directions of gaze. Mild left ptosis.  Face is symmetric. Palate elevates symmetrically.  Tongue is midline.  MOTOR:  Motor strength is 5/5 in all extremities.  Tone is normal.    MSRs:  Reflexes are 2+/4 throughout  SENSORY:  Intact to temperature, vibration, light touch throughout.  COORDINATION/GAIT:    Gait narrow based and stable.   Data: Lab Results  Component Value Date   TSH 0.387 01/21/2016   Lab Results  Component Value Date   HGBA1C 8.8* 11/26/2015   Lab Results  Component Value Date   EMLJQGBE01 007 04/08/2010    IMPRESSION/PLAN: Ms. Bethany Webb is a 55 year-old female with sarcoidosis (12/2015) returning for evaluation of left foot paresthesias. Clinically, she is doing much better since diabetes medication changes which has significantly improved left foot paresthesias.  Her neurological exam is also normal, without signs of neuropathy.   OK to discontinue gabapentin. Follow clinically  Return to clinic as needed   The duration of this appointment visit was 15 minutes of face-to-face time with the patient.  Greater than 50% of this time was spent in counseling, explanation of diagnosis, planning of further management, and coordination of care.   Thank you for allowing me to participate in patient's care.  If I can answer any additional questions, I would be pleased to do so.    Sincerely,    Kennetha Pearman K. Posey Pronto, DO

## 2016-05-25 ENCOUNTER — Encounter: Payer: Self-pay | Admitting: Medical

## 2016-05-25 ENCOUNTER — Ambulatory Visit (INDEPENDENT_AMBULATORY_CARE_PROVIDER_SITE_OTHER): Payer: BLUE CROSS/BLUE SHIELD | Admitting: Medical

## 2016-05-25 VITALS — BP 130/80 | HR 107 | Wt 146.0 lb

## 2016-05-25 DIAGNOSIS — T50905A Adverse effect of unspecified drugs, medicaments and biological substances, initial encounter: Secondary | ICD-10-CM

## 2016-05-25 DIAGNOSIS — R52 Pain, unspecified: Secondary | ICD-10-CM | POA: Diagnosis not present

## 2016-05-25 DIAGNOSIS — R6889 Other general symptoms and signs: Secondary | ICD-10-CM

## 2016-05-25 DIAGNOSIS — M545 Low back pain, unspecified: Secondary | ICD-10-CM

## 2016-05-25 DIAGNOSIS — R5382 Chronic fatigue, unspecified: Secondary | ICD-10-CM | POA: Diagnosis not present

## 2016-05-25 DIAGNOSIS — K130 Diseases of lips: Secondary | ICD-10-CM | POA: Diagnosis not present

## 2016-05-25 LAB — POCT URINALYSIS DIPSTICK
Bilirubin, UA: NEGATIVE
Ketones, UA: NEGATIVE
Leukocytes, UA: NEGATIVE
Nitrite, UA: NEGATIVE
PROTEIN UA: NEGATIVE
RBC UA: NEGATIVE
SPEC GRAV UA: 1.02
UROBILINOGEN UA: 0.2
pH, UA: 6

## 2016-05-25 LAB — CBC
HEMATOCRIT: 41.1 % (ref 35.0–45.0)
HEMOGLOBIN: 13.7 g/dL (ref 11.7–15.5)
MCH: 28.3 pg (ref 27.0–33.0)
MCHC: 33.3 g/dL (ref 32.0–36.0)
MCV: 84.9 fL (ref 80.0–100.0)
MPV: 10.2 fL (ref 7.5–12.5)
Platelets: 454 10*3/uL — ABNORMAL HIGH (ref 140–400)
RBC: 4.84 MIL/uL (ref 3.80–5.10)
RDW: 14.3 % (ref 11.0–15.0)
WBC: 4.2 10*3/uL (ref 4.0–10.5)

## 2016-05-25 LAB — COMPREHENSIVE METABOLIC PANEL
ALK PHOS: 497 U/L — AB (ref 33–130)
ALT: 83 U/L — AB (ref 6–29)
AST: 79 U/L — ABNORMAL HIGH (ref 10–35)
Albumin: 4.2 g/dL (ref 3.6–5.1)
BUN: 19 mg/dL (ref 7–25)
CALCIUM: 10.2 mg/dL (ref 8.6–10.4)
CHLORIDE: 99 mmol/L (ref 98–110)
CO2: 24 mmol/L (ref 20–31)
Creat: 0.88 mg/dL (ref 0.50–1.05)
GLUCOSE: 66 mg/dL (ref 65–99)
POTASSIUM: 4 mmol/L (ref 3.5–5.3)
Sodium: 140 mmol/L (ref 135–146)
Total Bilirubin: 1.6 mg/dL — ABNORMAL HIGH (ref 0.2–1.2)
Total Protein: 7.8 g/dL (ref 6.1–8.1)

## 2016-05-25 LAB — CK: Total CK: 41 U/L (ref 7–177)

## 2016-05-25 LAB — HEMOGLOBIN A1C
HEMOGLOBIN A1C: 7.6 % — AB (ref <5.7)
Mean Plasma Glucose: 171 mg/dL

## 2016-05-25 NOTE — Addendum Note (Signed)
Addended by: Billie Lade on: 05/25/2016 04:36 PM   Modules accepted: Orders

## 2016-05-25 NOTE — Patient Instructions (Signed)
Encounter Diagnoses  Name Primary?  . Chronic fatigue Yes  . Body aches   . Bilateral low back pain without sciatica   . Lip symptom   . Adverse effect of correct medicinal substance properly administered, initial encounter     Recommendations:  Use Novolog mix 70/30, 15 units at lunch, 15 units at dinner  After 4-5 days back on this regimen, assuming you are feeling better, you can use a sliding scale such as follows:  If glucose before lunch or before dinner is >200, use 20 units of the Novolog mix  If glucose before lunch or dinner is between 150-200, use 15 units of the Novolog mix  If glucose before meal is <150, only use 10 units of the Novolog mix  STOP xigduo  Continue to morning glucose  We will call with lab results  Get me copy of glucose readings and insulin units used within 3 weeks

## 2016-05-25 NOTE — Progress Notes (Signed)
Subjective: Chief Complaint  Patient presents with  . Generalized Body Aches    xigduo? body aches. back and leg pain. hurts to walk. fatique. started 2 weeks ago   Here for symptoms likely due to [place].  Started back on Xigduo 2 weeks ago, got nausea, got improvement on sugars, but now has loss of appetite, fatigue, back pain.  Has felt some lip tingling and swelling.  Online review of [name] shows the side effects.   She has hx/o sarcoidosis, but seems to be in remission per specialist.  Doesn't see pulmonary again for a while, sees GI doctor about liver again on 06/06/16.  Has been released by neurology.   Only been using Novolog mix once daily 70/30, using 20 units at lunch time given recent improved sugars.   Hasn't been using it at dinner for readings under 150.  Stopped invokana back from last visit when she changed back to [place].  Prior she has not tolerated plain metformin, didn't see much improvement with Glipizide, and has done ok on Novolog mix.   Been off steroids a month or more now.  Back in January we used Levemir.   Denies fever, diarrhea, vomiting, rash, no recent strenuous activity. No other aggravating or relieving factors. No other complaint.  Past Medical History  Diagnosis Date  . Hypertension   . Hyperlipidemia   . Allergy   . Supraspinatus tendonitis 2/12    Dr. Gladstone Lighter  . Biceps tendonitis 2/12    Dr. Gladstone Lighter  . History of mammogram 05/25/12    stable, no suspicious finding, extremely dense breast tissue  . S/P hysterectomy   . Neutropenia, unspecified ([place]) 02/2013    slight, likely ethnicity related  . 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 Diabetes mellitus     type II  . Pneumococcal vaccine refused 03/2013  . Refused influenza vaccine 03/2013  . Wears glasses   . History of blood transfusion 1988    hysterectomy  . Shortness of breath dyspnea     with exertion   . Sarcoidosis  ([place])    ROS as in subjective   Objective: BP 130/80 mmHg  Pulse 107  Wt 146 lb (66.225 kg)  LMP 12/27/1987  Gen: wd, wn, nad Heart: RRR, normal s1, s2, no murmurs Lungs clear, no wheezing, no rhonchi, no rales Ext: no edema Pulses normal MSK: tender in general, back, legs, arms    Assessment: Encounter Diagnoses  Name Primary?  . Chronic fatigue Yes  . Body aches   . Bilateral low back pain without sciatica   . Lip symptom   . Adverse effect of correct medicinal substance properly administered, initial encounter     Plan: Labs today, stop Xigduo, and use Novolog 70/30 mix for now  Patient Instructions   Encounter Diagnoses  Name Primary?  . Chronic fatigue Yes  . Body aches   . Bilateral low back pain without sciatica   . Lip symptom   . Adverse effect of correct medicinal substance properly administered, initial encounter     Recommendations:  Use Novolog mix 70/30, 15 units at lunch, 15 units at dinner  After 4-5 days back on this regimen, assuming you are feeling better, you can use a sliding scale such as follows:  If glucose before lunch or before dinner is >200, use 20 units of the Novolog mix  If glucose before lunch or dinner is between  150-200, use 15 units of the Novolog mix  If glucose before meal is <150, only use 10 units of the Novolog mix  STOP xigduo  Continue to morning glucose  We will call with lab results  Get me copy of glucose readings and insulin units used within 3 weeks

## 2016-05-30 ENCOUNTER — Other Ambulatory Visit: Payer: Self-pay | Admitting: Medical

## 2016-06-01 ENCOUNTER — Encounter: Payer: Self-pay | Admitting: Medical

## 2016-06-01 ENCOUNTER — Ambulatory Visit (INDEPENDENT_AMBULATORY_CARE_PROVIDER_SITE_OTHER): Payer: BLUE CROSS/BLUE SHIELD | Admitting: Medical

## 2016-06-01 VITALS — BP 140/98 | HR 91 | Wt 146.0 lb

## 2016-06-01 DIAGNOSIS — M549 Dorsalgia, unspecified: Secondary | ICD-10-CM | POA: Diagnosis not present

## 2016-06-01 DIAGNOSIS — R748 Abnormal levels of other serum enzymes: Secondary | ICD-10-CM

## 2016-06-01 DIAGNOSIS — R938 Abnormal findings on diagnostic imaging of other specified body structures: Secondary | ICD-10-CM

## 2016-06-01 DIAGNOSIS — R220 Localized swelling, mass and lump, head: Secondary | ICD-10-CM

## 2016-06-01 DIAGNOSIS — R9389 Abnormal findings on diagnostic imaging of other specified body structures: Secondary | ICD-10-CM

## 2016-06-01 DIAGNOSIS — K130 Diseases of lips: Secondary | ICD-10-CM

## 2016-06-01 DIAGNOSIS — R5382 Chronic fatigue, unspecified: Secondary | ICD-10-CM

## 2016-06-01 DIAGNOSIS — Z862 Personal history of diseases of the blood and blood-forming organs and certain disorders involving the immune mechanism: Secondary | ICD-10-CM | POA: Diagnosis not present

## 2016-06-01 DIAGNOSIS — E118 Type 2 diabetes mellitus with unspecified complications: Secondary | ICD-10-CM | POA: Diagnosis not present

## 2016-06-01 LAB — CBC WITH DIFFERENTIAL/PLATELET
Basophils Absolute: 27 cells/uL (ref 0–200)
Basophils Relative: 1 %
EOS PCT: 13 %
Eosinophils Absolute: 351 cells/uL (ref 15–500)
HCT: 41.5 % (ref 35.0–45.0)
Hemoglobin: 13.8 g/dL (ref 11.7–15.5)
LYMPHS ABS: 891 {cells}/uL (ref 850–3900)
LYMPHS PCT: 33 %
MCH: 28.3 pg (ref 27.0–33.0)
MCHC: 33.3 g/dL (ref 32.0–36.0)
MCV: 85 fL (ref 80.0–100.0)
MPV: 9.7 fL (ref 7.5–12.5)
Monocytes Absolute: 324 cells/uL (ref 200–950)
Monocytes Relative: 12 %
NEUTROS PCT: 41 %
Neutro Abs: 1107 cells/uL — ABNORMAL LOW (ref 1500–7800)
PLATELETS: 440 10*3/uL — AB (ref 140–400)
RBC: 4.88 MIL/uL (ref 3.80–5.10)
RDW: 14.6 % (ref 11.0–15.0)
WBC: 2.7 10*3/uL — AB (ref 4.0–10.5)

## 2016-06-01 LAB — HEPATIC FUNCTION PANEL
ALK PHOS: 474 U/L — AB (ref 33–130)
ALT: 112 U/L — ABNORMAL HIGH (ref 6–29)
AST: 100 U/L — AB (ref 10–35)
Albumin: 4.1 g/dL (ref 3.6–5.1)
BILIRUBIN DIRECT: 0.9 mg/dL — AB (ref <=0.2)
BILIRUBIN INDIRECT: 1.3 mg/dL — AB (ref 0.2–1.2)
BILIRUBIN TOTAL: 2.2 mg/dL — AB (ref 0.2–1.2)
Total Protein: 7.3 g/dL (ref 6.1–8.1)

## 2016-06-01 LAB — BASIC METABOLIC PANEL
BUN: 17 mg/dL (ref 7–25)
CO2: 23 mmol/L (ref 20–31)
Calcium: 9.5 mg/dL (ref 8.6–10.4)
Chloride: 100 mmol/L (ref 98–110)
Creat: 0.72 mg/dL (ref 0.50–1.05)
Glucose, Bld: 112 mg/dL — ABNORMAL HIGH (ref 65–99)
POTASSIUM: 3.8 mmol/L (ref 3.5–5.3)
SODIUM: 140 mmol/L (ref 135–146)

## 2016-06-01 LAB — LIPASE: LIPASE: 46 U/L (ref 7–60)

## 2016-06-01 NOTE — Progress Notes (Signed)
Subjective: Chief Complaint  Patient presents with  . lower & upper back pain    hurts to breathe. thinking it may be xigduo. making her have trouble walking.    Here for recheck.  I saw her 05/25/16 for several symptoms.   Feels about the same as last visit.  Still having back aches, lip swelling, pain throughout back.    At last visit she thought this was from starting back on xigduo for diabetes.   But she quit this on 05/25/16, now just on insulin for diabetes.  At last visit she had started getting loss of appetite, fatigue, back pain, had felt some lip tingling and swelling.  Online review of [name] shows the side effects.   She has hx/o sarcoidosis, but seems to be in remission per specialist.  Doesn't see pulmonary again for a while, sees GI doctor about liver again on 06/06/16.  Has been released by neurology.   Only been using Novolog mix once daily 70/30, using 20 units at lunch time given recent improved sugars.   Hasn't been using it at dinner for readings under 150.  Been off steroids a month or more now.  Denies fever, diarrhea, vomiting, rash, no recent strenuous activity. No other aggravating or relieving factors. No other complaint.  Past Medical History  Diagnosis Date  . Hypertension   . Hyperlipidemia   . Allergy   . Supraspinatus tendonitis 2/12    Dr. Gladstone Lighter  . Biceps tendonitis 2/12    Dr. Gladstone Lighter  . History of mammogram 05/25/12    stable, no suspicious finding, extremely dense breast tissue  . S/P hysterectomy   . Neutropenia, unspecified ([place]) 02/2013    slight, likely ethnicity related  . 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 Diabetes mellitus     type II  . Pneumococcal vaccine refused 03/2013  . Refused influenza vaccine 03/2013  . Wears glasses   . History of blood transfusion 1988    hysterectomy  . Shortness of breath dyspnea     with exertion   . Sarcoidosis ([place])    ROS as  in subjective   Objective: BP 140/98 mmHg  Pulse 91  Wt 146 lb (66.225 kg)  LMP 12/27/1987  BP Readings from Last 3 Encounters:  06/01/16 140/98  05/25/16 130/80  05/20/16 128/84   Wt Readings from Last 3 Encounters:  06/01/16 146 lb (66.225 kg)  05/25/16 146 lb (66.225 kg)  05/20/16 147 lb 9 oz (66.934 kg)   Gen: wd, wn, nad, somewhat ill appearing Back tender throughout Heart: RRR, normal s1, s2, no murmurs Lungs clear, no wheezing, no rhonchi, no rales Ext: no edema Pulses normal MSK: tender in general, back, legs, arms    Assessment: Encounter Diagnoses  Name Primary?  . Elevated liver enzymes Yes  . Chronic fatigue   . Bilateral back pain, unspecified location   . Lip swelling   . History of sarcoidosis   . Diabetes mellitus with complication (HCC)     Plan: Called and discussed case with Dr. Halford Chessman pulmonology and Dr. Darrel Hoover, GI with [facility], on call for Dr. Paulita Fujita.  After discussing case, will get CXR, US abdomen, STAT labs.  F/u pending labs.    Bethany Webb was seen today for lower & upper back pain.  Diagnoses and all orders for this visit:  Elevated liver enzymes -     CBC with Differential/Platelet -  Basic metabolic panel -     Hepatic function panel -     Cancel: Lipase -     US Abdomen Complete  Chronic fatigue -     CBC with Differential/Platelet -     Basic metabolic panel -     Hepatic function panel -     Cancel: Lipase  Bilateral back pain, unspecified location -     CBC with Differential/Platelet -     Basic metabolic panel -     Hepatic function panel -     Cancel: Lipase  Lip swelling -     CBC with Differential/Platelet -     Basic metabolic panel -     Hepatic function panel -     Cancel: Lipase  History of sarcoidosis  Diabetes mellitus with complication (HCC)  Other orders -     Cancel: Lipase -     Lipase

## 2016-06-02 ENCOUNTER — Ambulatory Visit
Admission: RE | Admit: 2016-06-02 | Discharge: 2016-06-02 | Disposition: A | Discharge status: Home / Self Care | Payer: BLUE CROSS/BLUE SHIELD | Source: Ambulatory Visit | Attending: Medical | Admitting: Medical

## 2016-06-02 DIAGNOSIS — Z862 Personal history of diseases of the blood and blood-forming organs and certain disorders involving the immune mechanism: Secondary | ICD-10-CM

## 2016-06-02 DIAGNOSIS — R9389 Abnormal findings on diagnostic imaging of other specified body structures: Secondary | ICD-10-CM

## 2016-06-02 NOTE — Addendum Note (Signed)
Addended by: Minette Headland A on: 06/02/2016 09:11 AM   Modules accepted: Orders

## 2016-06-03 ENCOUNTER — Ambulatory Visit (HOSPITAL_COMMUNITY): Payer: BLUE CROSS/BLUE SHIELD

## 2016-06-06 ENCOUNTER — Ambulatory Visit (HOSPITAL_COMMUNITY): Admission: RE | Admit: 2016-06-06 | Payer: BLUE CROSS/BLUE SHIELD | Source: Ambulatory Visit

## 2016-06-06 ENCOUNTER — Ambulatory Visit (HOSPITAL_COMMUNITY)
Admission: RE | Admit: 2016-06-06 | Discharge: 2016-06-06 | Disposition: A | Discharge status: Home / Self Care | Payer: BLUE CROSS/BLUE SHIELD | Source: Ambulatory Visit | Attending: Medical | Admitting: Medical

## 2016-06-06 ENCOUNTER — Other Ambulatory Visit: Payer: Self-pay | Admitting: Medical

## 2016-06-06 ENCOUNTER — Ambulatory Visit (HOSPITAL_COMMUNITY): Payer: BLUE CROSS/BLUE SHIELD

## 2016-06-06 DIAGNOSIS — R7989 Other specified abnormal findings of blood chemistry: Secondary | ICD-10-CM

## 2016-06-06 DIAGNOSIS — I700 Atherosclerosis of aorta: Secondary | ICD-10-CM | POA: Insufficient documentation

## 2016-06-06 DIAGNOSIS — R945 Abnormal results of liver function studies: Principal | ICD-10-CM

## 2016-06-10 ENCOUNTER — Other Ambulatory Visit: Payer: BLUE CROSS/BLUE SHIELD

## 2016-06-10 ENCOUNTER — Other Ambulatory Visit (HOSPITAL_COMMUNITY): Payer: Self-pay | Admitting: Gastroenterology

## 2016-06-10 DIAGNOSIS — R945 Abnormal results of liver function studies: Principal | ICD-10-CM

## 2016-06-10 DIAGNOSIS — R7989 Other specified abnormal findings of blood chemistry: Secondary | ICD-10-CM

## 2016-06-13 ENCOUNTER — Telehealth: Payer: Self-pay | Admitting: Medical

## 2016-06-13 ENCOUNTER — Ambulatory Visit (INDEPENDENT_AMBULATORY_CARE_PROVIDER_SITE_OTHER): Payer: BLUE CROSS/BLUE SHIELD | Admitting: Medical

## 2016-06-13 ENCOUNTER — Encounter: Payer: Self-pay | Admitting: Medical

## 2016-06-13 VITALS — BP 130/90 | HR 109 | Wt 140.0 lb

## 2016-06-13 DIAGNOSIS — Z862 Personal history of diseases of the blood and blood-forming organs and certain disorders involving the immune mechanism: Secondary | ICD-10-CM | POA: Diagnosis not present

## 2016-06-13 DIAGNOSIS — R5382 Chronic fatigue, unspecified: Secondary | ICD-10-CM | POA: Diagnosis not present

## 2016-06-13 DIAGNOSIS — R52 Pain, unspecified: Secondary | ICD-10-CM | POA: Diagnosis not present

## 2016-06-13 DIAGNOSIS — R748 Abnormal levels of other serum enzymes: Secondary | ICD-10-CM

## 2016-06-13 MED ORDER — INSULIN ASPART PROT & ASPART (70-30 MIX) 100 UNIT/ML PEN
PEN_INJECTOR | SUBCUTANEOUS | Status: DC
Start: 2016-06-13 — End: 2016-07-21

## 2016-06-13 NOTE — Telephone Encounter (Signed)
Pt states that she need a refill on Novolog

## 2016-06-13 NOTE — Progress Notes (Signed)
Subjective: Chief Complaint  Patient presents with  . Advice Only    has new fmla forms with her. still in pain.    Here to discuss FMLA forms.  I saw her recently for f/u on not feeling well, elevated liver tests, diabetes, pains throughout.  Since last visit here she saw GI.  They have her scheduled for liver biopsy this Friday.  She has to take frequent breaks, can't stand or work for very long, has to take breaks even every 15 minutes from walking.   Liver doctor didn't giver her steroid or pain medication or give any options for dealing with the pain.   She currently has no appt to see pulmonology.  The concern is sarcoidosis of liver vs autoimmune hepatitis.     She feels awful.  No other aggravating or relieving factors. No other complaint.  Past Medical History  Diagnosis Date  . Hypertension   . Hyperlipidemia   . Allergy   . Supraspinatus tendonitis 2/12    Dr. Gladstone Lighter  . Biceps tendonitis 2/12    Dr. Gladstone Lighter  . History of mammogram 05/25/12    stable, no suspicious finding, extremely dense breast tissue  . S/P hysterectomy   . Neutropenia, unspecified ([place]) 02/2013    slight, likely ethnicity related  . 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 Diabetes mellitus     type II  . Pneumococcal vaccine refused 03/2013  . Refused influenza vaccine 03/2013  . Wears glasses   . History of blood transfusion 1988    hysterectomy  . Shortness of breath dyspnea     with exertion   . Sarcoidosis ([place])    ROS as in subjective   Objective: BP 130/90 mmHg  Pulse 109  Wt 140 lb (63.504 kg)  LMP 12/27/1987  BP Readings from Last 3 Encounters:  06/13/16 130/90  06/01/16 140/98  05/25/16 130/80   Wt Readings from Last 3 Encounters:  06/13/16 140 lb (63.504 kg)  06/01/16 146 lb (66.225 kg)  05/25/16 146 lb (66.225 kg)   Gen: wd, wn, nad, somewhat ill appearing Not examined  otherwise    Assessment: Encounter Diagnoses  Name Primary?  . History of sarcoidosis Yes  . Elevated liver enzymes   . Chronic fatigue   . Diffuse pain     Plan: Will complete FMLA based on our discussion today, out of work excused from last 3 visits here, and continuous period of absence from June 26 - July 10.  She will go for liver biopsy Friday.  I advised she call GI back about pain control.   Will await [facility] notes from [facility].     Alescia was seen today for advice only.  Diagnoses and all orders for this visit:  History of sarcoidosis  Elevated liver enzymes  Chronic fatigue  Diffuse pain  Other orders -     insulin aspart protamine - aspart (NOVOLOG MIX 70/30 FLEXPEN) (70-30) 100 UNIT/ML FlexPen; INJECT 20 UNITS INTO THE SKIN TWICE DAILY WITH A MEAL

## 2016-06-15 ENCOUNTER — Other Ambulatory Visit: Payer: Self-pay | Admitting: Radiology

## 2016-06-16 ENCOUNTER — Other Ambulatory Visit: Payer: Self-pay | Admitting: Radiology

## 2016-06-16 ENCOUNTER — Other Ambulatory Visit: Payer: Self-pay | Admitting: General Surgery

## 2016-06-17 ENCOUNTER — Ambulatory Visit (HOSPITAL_COMMUNITY)
Admission: RE | Admit: 2016-06-17 | Discharge: 2016-06-17 | Disposition: A | Discharge status: Home / Self Care | Payer: BLUE CROSS/BLUE SHIELD | Source: Ambulatory Visit | Attending: Gastroenterology | Admitting: Gastroenterology

## 2016-06-17 ENCOUNTER — Encounter (HOSPITAL_COMMUNITY): Payer: Self-pay

## 2016-06-17 DIAGNOSIS — Z79899 Other long term (current) drug therapy: Secondary | ICD-10-CM | POA: Diagnosis not present

## 2016-06-17 DIAGNOSIS — Z794 Long term (current) use of insulin: Secondary | ICD-10-CM | POA: Diagnosis not present

## 2016-06-17 DIAGNOSIS — R945 Abnormal results of liver function studies: Secondary | ICD-10-CM

## 2016-06-17 DIAGNOSIS — D869 Sarcoidosis, unspecified: Secondary | ICD-10-CM | POA: Insufficient documentation

## 2016-06-17 DIAGNOSIS — E785 Hyperlipidemia, unspecified: Secondary | ICD-10-CM | POA: Insufficient documentation

## 2016-06-17 DIAGNOSIS — I10 Essential (primary) hypertension: Secondary | ICD-10-CM | POA: Diagnosis not present

## 2016-06-17 DIAGNOSIS — E119 Type 2 diabetes mellitus without complications: Secondary | ICD-10-CM | POA: Diagnosis not present

## 2016-06-17 DIAGNOSIS — K7589 Other specified inflammatory liver diseases: Secondary | ICD-10-CM | POA: Insufficient documentation

## 2016-06-17 DIAGNOSIS — R7989 Other specified abnormal findings of blood chemistry: Secondary | ICD-10-CM | POA: Diagnosis present

## 2016-06-17 LAB — GLUCOSE, CAPILLARY
GLUCOSE-CAPILLARY: 87 mg/dL (ref 65–99)
Glucose-Capillary: 111 mg/dL — ABNORMAL HIGH (ref 65–99)

## 2016-06-17 LAB — CBC
HEMATOCRIT: 39 % (ref 36.0–46.0)
HEMOGLOBIN: 12.9 g/dL (ref 12.0–15.0)
MCH: 27.4 pg (ref 26.0–34.0)
MCHC: 33.1 g/dL (ref 30.0–36.0)
MCV: 82.8 fL (ref 78.0–100.0)
Platelets: 403 10*3/uL — ABNORMAL HIGH (ref 150–400)
RBC: 4.71 MIL/uL (ref 3.87–5.11)
RDW: 14.8 % (ref 11.5–15.5)
WBC: 3.2 10*3/uL — ABNORMAL LOW (ref 4.0–10.5)

## 2016-06-17 LAB — APTT: aPTT: 31 seconds (ref 24–37)

## 2016-06-17 LAB — PROTIME-INR
INR: 0.97 (ref 0.00–1.49)
Prothrombin Time: 13.1 seconds (ref 11.6–15.2)

## 2016-06-17 MED ORDER — MIDAZOLAM HCL 2 MG/2ML IJ SOLN
INTRAMUSCULAR | Status: AC | PRN
  Administered 2016-06-17 (×2): 1 mg via INTRAVENOUS

## 2016-06-17 MED ORDER — LIDOCAINE HCL 1 % IJ SOLN
INTRAMUSCULAR | Status: AC
  Filled 2016-06-17: qty 20

## 2016-06-17 MED ORDER — FENTANYL CITRATE (PF) 100 MCG/2ML IJ SOLN
INTRAMUSCULAR | Status: AC | PRN
  Administered 2016-06-17: 25 ug via INTRAVENOUS
  Administered 2016-06-17: 50 ug via INTRAVENOUS

## 2016-06-17 MED ORDER — SODIUM CHLORIDE 0.9 % IV SOLN: Freq: Once | INTRAVENOUS | Status: DC

## 2016-06-17 MED ORDER — SODIUM CHLORIDE 0.9 % IV SOLN
INTRAVENOUS | Status: AC | PRN
  Administered 2016-06-17: 50 mL/h via INTRAVENOUS

## 2016-06-17 MED ORDER — HYDROCODONE-ACETAMINOPHEN 5-325 MG PO TABS: 1.0000 | ORAL_TABLET | ORAL | Status: DC | PRN

## 2016-06-17 MED ORDER — MIDAZOLAM HCL 2 MG/2ML IJ SOLN
INTRAMUSCULAR | Status: AC
  Filled 2016-06-17: qty 4

## 2016-06-17 MED ORDER — GELATIN ABSORBABLE 12-7 MM EX MISC
CUTANEOUS | Status: AC
  Filled 2016-06-17: qty 1

## 2016-06-17 MED ORDER — FENTANYL CITRATE (PF) 100 MCG/2ML IJ SOLN
INTRAMUSCULAR | Status: AC
  Filled 2016-06-17: qty 4

## 2016-06-17 NOTE — Sedation Documentation (Signed)
Patient is resting comfortably. 

## 2016-06-17 NOTE — Sedation Documentation (Signed)
O2 d/c'd 

## 2016-06-17 NOTE — Discharge Instructions (Signed)

## 2016-06-17 NOTE — Sedation Documentation (Signed)
O2 2l/[place] applied 

## 2016-06-17 NOTE — Procedures (Signed)
Interventional Radiology Procedure Note  Procedure:  US guided liver biopsy  Complications:  None  Estimated Blood Loss: < 10 mL  18 G core biopsy x 2 in right lobe of liver via 17 G needle.  Gelfoam pledgets used after procedure through outer needle.  Plan:  3 hour recovery  Shaelyn Decarli T. Kathlene Cote, M.D Pager:  [phone]

## 2016-06-17 NOTE — H&P (Signed)
Chief Complaint: Patient was seen in consultation today for liver core biopsy at the request of [place]  Referring Physician(s): Arta Silence  Supervising Physician: Aletta Edouard  Patient Status: Outpatient  History of Present Illness: Bethany Webb is a 55 y.o. female   Pt with Hx sarcoidosis Elevated liver functions persistent x few months Request for liver core biopsy per Dr Paulita Fujita   Past Medical History  Diagnosis Date  . Hypertension   . Hyperlipidemia   . Allergy   . Supraspinatus tendonitis 2/12    Dr. Gladstone Lighter  . Biceps tendonitis 2/12    Dr. Gladstone Lighter  . History of mammogram 05/25/12    stable, no suspicious finding, extremely dense breast tissue  . S/P hysterectomy   . Neutropenia, unspecified ([place]) 02/2013    slight, likely ethnicity related  . 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 Diabetes mellitus     type II  . Pneumococcal vaccine refused 03/2013  . Refused influenza vaccine 03/2013  . Wears glasses   . History of blood transfusion 1988    hysterectomy  . Shortness of breath dyspnea     with exertion   . Sarcoidosis [facility])     Past Surgical History  Procedure Laterality Date  . Breast reduction surgery  1994  . Abdominal hysterectomy  1990    and BSO; endometriosis  . Colonoscopy  03/06/12    sigmoid polyp; repeat 10 years; Dr. Tora Duck  . Anterior cervical decomp/discectomy fusion N/A 06/20/2013    Procedure: ANTERIOR CERVICAL DECOMPRESSION/DISCECTOMY FUSION 1 LEVEL;  Surgeon: Ophelia Charter, MD;  Location: [place] NEURO ORS;  Service: Neurosurgery;  Laterality: N/A;  C45 anterior cervical decompression with fusion interbody prothesis plating and bonegraft  . Carpal tunnel release Right 06/20/2013    Procedure: CARPAL TUNNEL RELEASE;  Surgeon: Ophelia Charter, MD;  Location: [place] NEURO ORS;  Service: Neurosurgery;  Laterality: Right;  RIGHT carpal tunnel  release  . Lung surgery  1987    endometriosis in lungs  . Supraclavical node biopsy Left 01/14/2016    Procedure: LEFT SUPRACLAVICAL NODE BIOPSY;  Surgeon: Melrose Nakayama, MD;  Location: [place];  Service: Thoracic;  Laterality: Left;    Allergies: Codeine and Metformin and related  Medications: Prior to Admission medications   Medication Sig Start Date End Date Taking? Authorizing Provider  amLODipine (NORVASC) 10 MG tablet Take 1 tablet (10 mg total) by mouth daily. 12/09/15  Yes Camelia Eng Tysinger, PA-C  blood glucose meter kit and supplies KIT Dispense based on patient and insurance preference. Use up to four times daily as directed. (FOR ICD-9 250.00, 250.01). 03/08/16  Yes Camelia Eng Tysinger, PA-C  cetirizine (ZYRTEC) 10 MG tablet Take 10 mg by mouth daily.   Yes Historical Provider, MD  glucose blood test strip Test blood sugar twice daily. Pt uses one touch ultra 09/14/15  Yes Camelia Eng Tysinger, PA-C  ibuprofen (ADVIL,MOTRIN) 800 MG tablet TAKE 1 TABLET(800 MG) BY MOUTH EVERY 8 HOURS AS NEEDED 04/27/16  Yes Camelia Eng Tysinger, PA-C  insulin aspart protamine - aspart (NOVOLOG MIX 70/30 FLEXPEN) (70-30) 100 UNIT/ML FlexPen INJECT 20 UNITS INTO THE SKIN TWICE DAILY WITH A MEAL 06/13/16  Yes Camelia Eng Tysinger, PA-C  Lancets 30G MISC Test blood sugars twice daily 09/14/15  Yes Camelia Eng Tysinger, PA-C  lisinopril-hydrochlorothiazide (PRINZIDE,ZESTORETIC) 20-25 MG tablet Take 1 tablet by mouth daily. 12/09/15  Yes  Camelia Eng Tysinger, PA-C  tetrahydrozoline 0.05 % ophthalmic solution Place 1 drop into both eyes 2 (two) times daily as needed (dry eye).   Yes Historical Provider, MD     Family History  Problem Relation Age of Onset  . Cancer Paternal Grandmother     breast  . Diabetes Other     maternal and paternal grandmother  . Heart disease Father 33    died of MI, CABG   . Multiple sclerosis Sister   . Asthma Brother   . Cancer Maternal Uncle     hematologic  . Hypertension Maternal  Grandmother   . Cancer Maternal Grandfather     prostate  . Hypertension Maternal Grandfather   . Asthma Brother   . Eczema Sister   . Allergies Sister   . Healthy Daughter     Social History   Social History  . Marital Status: Single    Spouse Name: N/A  . Number of Children: N/A  . Years of Education: N/A   Occupational History  . Midwife    Social History Main Topics  . Smoking status: Never Smoker   . Smokeless tobacco: Never Used  . Alcohol Use: 0.0 oz/week    0 Glasses of wine per week     Comment: rare wine, maybe 2-3 times years  . Drug Use: No  . Sexual Activity: No   Other Topics Concern  . 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  Lives alone in a one story home.  Education: 2-3 years of college.     Review of Systems: A 12 point ROS discussed and pertinent positives are indicated in the HPI above.  All other systems are negative.  Review of Systems  Constitutional: Negative for fever, activity change, appetite change and fatigue.  Gastrointestinal: Negative for abdominal pain.  Musculoskeletal: Positive for back pain.  Neurological: Negative for weakness.  Psychiatric/Behavioral: Negative for behavioral problems and confusion.    Vital Signs: BP 127/77 mmHg  Pulse 98  Temp(Src) 98.5 F (36.9 C) (Oral)  Resp 18  Ht _0  (1.626 m)  Wt 140 lb (63.504 kg)  BMI 24.02 kg/m2  SpO2 99%  LMP 12/27/1987  Physical Exam  Constitutional: She is oriented to person, place, and time.  Cardiovascular: Normal rate, regular rhythm and normal heart sounds.   Pulmonary/Chest: Effort normal and breath sounds normal. She has no wheezes.  Abdominal: Soft. Bowel sounds are normal. There is no tenderness.  Musculoskeletal: Normal range of motion.  Neurological: She is alert and oriented to person, place, and time.  Skin: Skin is warm and dry.    Psychiatric: She has a normal mood and affect. Her behavior is normal. Judgment and thought content normal.  Nursing note and vitals reviewed.   Mallampati Score:  MD Evaluation Airway: WNL Heart: WNL Abdomen: WNL Chest/ Lungs: WNL ASA  Classification: 2 Mallampati/Airway Score: Two  Imaging: Dg Chest 2 View  06/02/2016  CLINICAL DATA:  Persistent posterior chest pain over the past 2 weeks with an exertional component. Presumptive diagnosis of sarcoidosis made from PET-CT study in January 2017 EXAM: CHEST  2 VIEW COMPARISON:  PET-CT study of January 21, 2016 and chest x-ray of January 14, 2016. FINDINGS: There remains bilateral hilar lymphadenopathy not greatly changed since the previous study. The lungs are adequately inflated. The interstitial markings are coarse and slightly more conspicuous today. There  is a stable calcified 1 cm diameter nodule peripherally in the left lower lobe. The heart is normal in size. The pulmonary vascularity is not engorged. The trachea is midline. There is no pleural effusion or pneumothorax. IMPRESSION: Stable bilateral hilar lymphadenopathy. Mild interstitial prominence bilaterally slightly more conspicuous overall. These findings are consistent with the diagnosis of sarcoidosis. Evidence of previous granulomatous infection. No CHF. Electronically Signed   By: David  Martinique M.D.   On: 06/02/2016 09:37   US Abdomen Complete  06/06/2016  CLINICAL DATA:  Elevated LFTs. Diabetes, hypertension, hyperlipidemia. EXAM: ABDOMEN ULTRASOUND COMPLETE COMPARISON:  PET-CT 01/21/2016, CT abdomen pelvis 12/17/2015 FINDINGS: Gallbladder: Gallbladder has a normal appearance. Gallbladder wall is 2.0 mm, within normal limits. No stones or pericholecystic fluid. No sonographic Murphy's sign. Common bile duct: Diameter: 5.0 mm Liver: Mildly echogenic without other stigmata of fatty liver. No focal liver lesions. IVC: No abnormality visualized. Pancreas: Visualized portion  unremarkable. Spleen: Size and appearance within normal limits. Right Kidney: Length: 10.6 cm. Echogenicity within normal limits. No mass or hydronephrosis visualized. Left Kidney: Length: 10.3 cm. Echogenicity within normal limits. No mass or hydronephrosis visualized. Abdominal aorta: Atherosclerotic calcifications are noted. No aneurysm. Other findings: None. IMPRESSION: 1.  No evidence for acute  abnormality. 2. Atherosclerosis of the abdominal aorta. Electronically Signed   By: Nolon Nations M.D.   On: 06/06/2016 08:53    Labs:  CBC:  Recent Labs  01/21/16 1409 05/25/16 1308 06/01/16 1016 06/17/16 1200  WBC 4.7 4.2 2.7* 3.2*  HGB 11.9 13.7 13.8 12.9  HCT 35.1 41.1 41.5 39.0  PLT 466* 454* 440* 403*    COAGS:  Recent Labs  01/07/16 1430  INR 0.80*    BMP:  Recent Labs  11/26/15 0001 01/05/16 0001 01/07/16 1430 01/21/16 1409 05/25/16 1308 06/01/16 1016  NA 136 137 138 137 140 140  K 4.0 3.9 3.7 3.4* 4.0 3.8  CL 96* 96*  --   --  99 100  CO2 _0 GLUCOSE 161* 192* 132 261* 66 112*  BUN 18 13 10.1 15._1 CALCIUM 9.3 9.8 10.0 9.8 10.2 9.5  CREATININE 0.94 0.83 0.8 1.0 0.88 0.72    LIVER FUNCTION TESTS:  Recent Labs  01/21/16 1409 02/22/16 1044 05/25/16 1308 06/01/16 1016  BILITOT 1.56* 1.1 1.6* 2.2*  AST 76* 26 79* 100*  ALT 95* 49* 83* 112*  ALKPHOS 630* 197* 497* 474*  PROT 8.1 7.5 7.8 7.3  ALBUMIN 3.4* 4.2 4.2 4.1    TUMOR MARKERS:  Recent Labs  11/26/15 0001  AFPTM 4.0    Assessment and Plan:  Persistent elevation in LFTs Scheduled for liver core biopsy Risks and Benefits discussed with the patient including, but not limited to bleeding, infection, damage to adjacent structures or low yield requiring additional tests. All of the patient's questions were answered, patient is agreeable to proceed. Consent signed and in chart.   Thank you for this interesting consult.  I greatly enjoyed meeting Bethany Webb and  look forward to participating in their care.  A copy of this report was sent to the requesting provider on this date.  Electronically Signed: Monia Sabal A 06/17/2016, 12:12 PM   I spent a total of  30 Minutes   in face to face in clinical consultation, greater than 50% of which was counseling/coordinating care for liver core bx

## 2016-06-27 ENCOUNTER — Telehealth: Payer: Self-pay | Admitting: Medical

## 2016-06-27 MED ORDER — PEN NEEDLES 32G X 4 MM MISC: 1.0000 | Freq: Every day | Status: DC

## 2016-06-27 NOTE — Telephone Encounter (Signed)
done

## 2016-06-27 NOTE — Telephone Encounter (Signed)
Pt needs refill BD ultra fine needles Nano, for her Novolog flex pen

## 2016-07-01 ENCOUNTER — Ambulatory Visit (INDEPENDENT_AMBULATORY_CARE_PROVIDER_SITE_OTHER): Payer: BLUE CROSS/BLUE SHIELD | Admitting: Medical

## 2016-07-01 ENCOUNTER — Encounter: Payer: Self-pay | Admitting: Medical

## 2016-07-01 VITALS — BP 120/70 | HR 101 | Wt 143.0 lb

## 2016-07-01 DIAGNOSIS — D8689 Sarcoidosis of other sites: Secondary | ICD-10-CM | POA: Diagnosis not present

## 2016-07-01 DIAGNOSIS — E118 Type 2 diabetes mellitus with unspecified complications: Secondary | ICD-10-CM | POA: Diagnosis not present

## 2016-07-01 NOTE — Progress Notes (Signed)
Subjective: Chief Complaint  Patient presents with  . Follow-up    has two new medicines prednisone and azathioprine   Here for f/u on diabetes, sugar control.  Last visit she was still in a lot of pain.  Has been back to GI since last visit, put on Azathioprine and back on prednisone 10mg  TID x 6-8 weeks.  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  Was advised she will probably be on Azathioprine around 2 years.   Will likely need to go up on the dose.  Has f/u appt in 2wk for labs, 4wk for visit with GI.  Sees Dr. Paulita Fujita at [facility].    Was advised monitoring by GI for white cells given the medications.    Sugars are running 99-200 lately.  Still not eating well.  Only has been on prednisone a day, and knows her sugars will creep up.  Here for advice on this.   Taking Novolog 70/30, 20 lunch, 20 dinner.     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   Past Medical History  Diagnosis Date  . Hypertension   . Hyperlipidemia   . Allergy   . Supraspinatus tendonitis 2/12    Dr. Gladstone Lighter  . Biceps tendonitis 2/12    Dr. Gladstone Lighter  . History of mammogram 05/25/12    stable, no suspicious finding, extremely dense breast tissue  . S/P hysterectomy   . Neutropenia, unspecified ([place]) 02/2013    slight, likely ethnicity related  . 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 Diabetes mellitus     type II  . Pneumococcal vaccine refused 03/2013  . Refused influenza vaccine 03/2013  . Wears glasses   . History of blood transfusion 1988    hysterectomy  . Shortness of breath dyspnea     with exertion   . Sarcoidosis ([place])    ROS as in subjective   Objective: BP 120/70 mmHg  Pulse 101  Wt 143 lb (64.864 kg)  LMP 12/27/1987  Gen: wd, wn, nad    Assessment: Encounter Diagnoses  Name Primary?  . Diabetes mellitus with complication ([place]) Yes  . Sarcoidosis of other sites [facility])       Plan: Advised she be cautious at camp.  Avoid prolonged periods in sun, hydrate well, take breaks often.   Make sure she has her Novolog, testing supplies, avoiding long periods without eating.  Pace her self and listen to her body.     Use sun block, insect repellant in the evenings.    Gave her the flexibility to titrate up 3 units Novolog mix 70/30 every 3-5 days as long as sugars remain above 150.  Avoid hypoglycemia as discussed.  advised she talk with her fellow counselors to watch out for her, give her feedback if she seems tired, dehydrated or having problems.    Advised she contact me through my chart in the next 2 weeks for update on sugar readings and titration changes.  F/u otherwise prn.

## 2016-07-01 NOTE — Patient Instructions (Addendum)
Regarding diabetes  Continue Novolog 70/30 mix insulin.   Go ahead and bump up to 23 units twice daily since you are at 20 unites twice daily now  You can increase the insulin units 3 units per shot every 3-5 days assuming the pre meal glucose readings are still over 150  If you are seeing glucose readings before meals in the 150 or less range, then hold the current dose  If you see numbers down in the 70 or below range pre meal, then hold off on the next dose and call us  If you increase over the next 2 weeks up to 30 unites twice daily and the sugar readings are well in to the 300s, then cal as we may titrate up higher    Hypoglycemia Hypoglycemia occurs when the glucose in your blood is too low. Glucose is a type of sugar that is your body's main energy source. Hormones, such as insulin and glucagon, control the level of glucose in the blood. Insulin lowers blood glucose and glucagon increases blood glucose. Having too much insulin in your blood stream, or not eating enough food containing sugar, can result in hypoglycemia. Hypoglycemia can happen to people with or without diabetes. It can develop quickly and can be a medical emergency.  CAUSES   Missing or delaying meals.  Not eating enough carbohydrates at meals.  Taking too much diabetes medicine.  Not timing your oral diabetes medicine or insulin doses with meals, snacks, and exercise.  Nausea and vomiting.  Certain medicines.  Severe illnesses, such as hepatitis, kidney disorders, and certain eating disorders.  Increased activity or exercise without eating something extra or adjusting medicines.  Drinking too much alcohol.  A nerve disorder that affects body functions like your heart rate, blood pressure, and digestion (autonomic neuropathy).  A condition where the stomach muscles do not function properly (gastroparesis). Therefore, medicines and food may not absorb properly.  Rarely, a tumor of the pancreas can  produce too much insulin. SYMPTOMS   Hunger.  Sweating (diaphoresis).  Change in body temperature.  Shakiness.  Headache.  Anxiety.  Lightheadedness.  Irritability.  Difficulty concentrating.  Dry mouth.  Tingling or numbness in the hands or feet.  Restless sleep or sleep disturbances.  Altered speech and coordination.  Change in mental status.  Seizures or prolonged convulsions.  Combativeness.  Drowsiness (lethargic).  Weakness.  Increased heart rate or palpitations.  Confusion.  Pale, gray skin color.  Blurred or double vision.  Fainting. DIAGNOSIS  A physical exam and medical history will be performed. Your caregiver may make a diagnosis based on your symptoms. Blood tests and other lab tests may be performed to confirm a diagnosis. Once the diagnosis is made, your caregiver will see if your signs and symptoms go away once your blood glucose is raised.  TREATMENT  Usually, you can easily treat your hypoglycemia when you notice symptoms.  Check your blood glucose. If it is less than 70 mg/dl, take one of the following:   3-4 glucose tablets.    cup juice.    cup regular soda.   1 cup skim milk.   -1 tube of glucose gel.   5-6 hard candies.   Avoid high-fat drinks or food that may delay a rise in blood glucose levels.  Do not take more than the recommended amount of sugary foods, drinks, gel, or tablets. Doing so will cause your blood glucose to go too high.   Wait 10-15 minutes and recheck your blood  glucose. If it is still less than 70 mg/dl or below your target range, repeat treatment.   Eat a snack if it is more than 1 hour until your next meal.  There may be a time when your blood glucose may go so low that you are unable to treat yourself at home when you start to notice symptoms. You may need someone to help you. You may even faint or be unable to swallow. If you cannot treat yourself, someone will need to bring you to  the hospital.  [place]  If you have diabetes, follow your diabetes management plan by:  Taking your medicines as directed.  Following your exercise plan.  Following your meal plan. Do not skip meals. Eat on time.  Testing your blood glucose regularly. Check your blood glucose before and after exercise. If you exercise longer or different than usual, be sure to check blood glucose more frequently.  Wearing your medical alert jewelry that says you have diabetes.  Identify the cause of your hypoglycemia. Then, develop ways to prevent the recurrence of hypoglycemia.  Do not take a hot bath or shower right after an insulin shot.  Always carry treatment with you. Glucose tablets are the easiest to carry.  If you are going to drink alcohol, drink it only with meals.  Tell friends or family members ways to keep you safe during a seizure. This may include removing hard or sharp objects from the area or turning you on your side.  Maintain a healthy weight. SEEK MEDICAL CARE IF:   You are having problems keeping your blood glucose in your target range.  You are having frequent episodes of hypoglycemia.  You feel you might be having side effects from your medicines.  You are not sure why your blood glucose is dropping so low.  You notice a change in vision or a new problem with your vision. SEEK IMMEDIATE MEDICAL CARE IF:   Confusion develops.  A change in mental status occurs.  The inability to swallow develops.  Fainting occurs.   This information is not intended to replace advice given to you by your health care provider. Make sure you discuss any questions you have with your health care provider.   Document Released: 12/12/2005 Document Revised: 12/17/2013 Document Reviewed: 08/18/2015 Elsevier Interactive Patient Education [employer].

## 2016-07-11 ENCOUNTER — Telehealth: Payer: Self-pay

## 2016-07-11 DIAGNOSIS — E108 Type 1 diabetes mellitus with unspecified complications: Principal | ICD-10-CM

## 2016-07-11 DIAGNOSIS — IMO0002 Reserved for concepts with insufficient information to code with codable children: Secondary | ICD-10-CM

## 2016-07-11 DIAGNOSIS — E1065 Type 1 diabetes mellitus with hyperglycemia: Secondary | ICD-10-CM

## 2016-07-11 NOTE — Telephone Encounter (Signed)
Pt called the office. Appt was scheduled, she states that she was prescribed prednisone 2 weeks ago and has 7 more weeks. Her sugars are running 400-500. Just wanted to give you a heads up. Victorino December

## 2016-07-11 NOTE — Telephone Encounter (Signed)
It sounds like we will need to bump up a lot higher in dose.  Verify current 70/30 insulin, and we will modify based on this.   Verify how often she is eating

## 2016-07-12 NOTE — Telephone Encounter (Signed)
Is using 70/30 and using 40 units currently. This AM sugar was 248. 3-4 small meals a day.

## 2016-07-12 NOTE — Telephone Encounter (Signed)
LMTCB

## 2016-07-12 NOTE — Telephone Encounter (Signed)
Ok , I assume 40 units BID correct?  If so, and if still getting 200 + readings regularly, go on up to 45 units BID and lets refer to endocrinology for help with this.

## 2016-07-12 NOTE — Telephone Encounter (Signed)
Pt comes in today for appt will discuss then

## 2016-07-12 NOTE — Telephone Encounter (Signed)
Last message had a date error appt is Thursday will call pt today

## 2016-07-12 NOTE — Telephone Encounter (Signed)
Pt is also taking medication first thing in the morning verses at 12pm.

## 2016-07-13 NOTE — Telephone Encounter (Signed)
Referral done to [facility] Endocr[facility]ology. LMTCB

## 2016-07-13 NOTE — Telephone Encounter (Signed)
Pt is aware and is coming in next week with readings to discuss next week

## 2016-07-14 ENCOUNTER — Institutional Professional Consult (permissible substitution): Payer: BLUE CROSS/BLUE SHIELD | Admitting: Medical

## 2016-07-21 ENCOUNTER — Encounter: Payer: Self-pay | Admitting: Medical

## 2016-07-21 ENCOUNTER — Ambulatory Visit (INDEPENDENT_AMBULATORY_CARE_PROVIDER_SITE_OTHER): Payer: BLUE CROSS/BLUE SHIELD | Admitting: Medical

## 2016-07-21 VITALS — BP 118/78 | HR 92 | Wt 153.0 lb

## 2016-07-21 DIAGNOSIS — E118 Type 2 diabetes mellitus with unspecified complications: Secondary | ICD-10-CM | POA: Diagnosis not present

## 2016-07-21 DIAGNOSIS — K7689 Other specified diseases of liver: Secondary | ICD-10-CM | POA: Diagnosis not present

## 2016-07-21 DIAGNOSIS — D8689 Sarcoidosis of other sites: Secondary | ICD-10-CM

## 2016-07-21 DIAGNOSIS — R945 Abnormal results of liver function studies: Secondary | ICD-10-CM

## 2016-07-21 MED ORDER — INSULIN ASPART PROT & ASPART (70-30 MIX) 100 UNIT/ML PEN: 50.0000 [IU] | PEN_INJECTOR | Freq: Three times a day (TID) | SUBCUTANEOUS | 3 refills | Status: DC

## 2016-07-21 MED ORDER — GLUCOSE BLOOD VI STRP: ORAL_STRIP | 11 refills | Status: DC

## 2016-07-21 NOTE — Progress Notes (Signed)
Subjective: Chief Complaint  Patient presents with  . Other    sugars are still elevated. 400 was her fasting sugars. prednisone 3x a day.    Here for uncontrolled sugars.  Is diabetic, currently undergoing treatment for liver sarcoidosis, is on long term prednisone for the next several weeks along with azathioprine.  Sugars are all running high and with the prednisone, eating all the time.  Eating healthy foods, but eating throughout the day, crazy appetite.   Still using Novolog 70/30, using 45 u morning and afternoon at 7:30am and between 2-4 pm for second dose.   Reviewed her sugar readings, morning readings fasting range from 164 - 442. She did have isolated 87.   Glucose numbers at lunch range 141-560, dinner 240- HI> 600.    No other c/o.   Current Outpatient Prescriptions on File Prior to Visit  Medication Sig Dispense Refill  . amLODipine (NORVASC) 10 MG tablet Take 1 tablet (10 mg total) by mouth daily. 90 tablet 3  . azaTHIOprine (IMURAN) 50 MG tablet Take 50 mg by mouth daily.    . blood glucose meter kit and supplies KIT Dispense based on patient and insurance preference. Use up to four times daily as directed. (FOR ICD-9 250.00, 250.01). 1 each 0  . Insulin Pen Needle (PEN NEEDLES) 32G X 4 MM MISC 1 each by Does not apply route daily. 200 each 1  . Lancets 30G MISC Test blood sugars twice daily 100 each 2  . lisinopril-hydrochlorothiazide (PRINZIDE,ZESTORETIC) 20-25 MG tablet Take 1 tablet by mouth daily. 90 tablet 3  . predniSONE (DELTASONE) 10 MG tablet Take 10 mg by mouth 3 (three) times daily.    . cetirizine (ZYRTEC) 10 MG tablet Take 10 mg by mouth daily.    Marland Kitchen ibuprofen (ADVIL,MOTRIN) 800 MG tablet TAKE 1 TABLET(800 MG) BY MOUTH EVERY 8 HOURS AS NEEDED (Patient not taking: Reported on 07/21/2016) 30 tablet 0  . tetrahydrozoline 0.05 % ophthalmic solution Place 1 drop into both eyes 2 (two) times daily as needed (dry eye).     No current facility-administered medications on  file prior to visit.    ROS as in subjective    Objective: BP 118/78   Pulse 92   Wt 153 lb (69.4 kg)   LMP 12/27/1987   BMI 26.26 kg/m   Gen: wd, wn, nad, seems to be in better spirits and feeling better compared to last visit Otherwise not examined  Assessment: Encounter Diagnoses  Name Primary?  . Diabetes mellitus with complication ([place]) Yes  . Sarcoidosis of other sites [facility])   . Abnormal liver function      Plan: Since she is eating several meals throughout the day, uncontrolled sugars, change to new regimen to try and cover the need.   discussing options, we are trying to limit number of shots daily.  Advised she try hard to keep meals to 3-5 small meals daily, try to avoid getting up in the early morning for snack given the change in appetite.   C/t healthy foods, avoid high sugar foods, sweets, juice, candy, etc., always combine a protein with a carb as discussed.   C/t glucose monitoring.    Change from Novolog 70/30, 45 u BID, to 35 u TID for now.  Call Monday with update.    Patient Instructions  Starting today, lets change your Novolog 70/30 insulin to 3 times daily   35 units three times daily such as 7:30am, 12 noon, and 5pm  Monday, call  or My Chart Message me to let me know how you did over the weekend with sugars readings and the change in dosing  At that point we will probably bump up to 38 units three times daily.   Eat a carb + protein with each meal or snack    Of note, I called and spoke to Dr. Dwyane Dee.  She will be seeing him to establish next month.  I discussed her regimen.  She will likely need much higher insulin doses, particular in the morning.  Will titrate accordingly, she will call back Monday with sugar readings.   Navdeep was seen today for other.  Diagnoses and all orders for this visit:  Diabetes mellitus with complication ([place])  Sarcoidosis of other sites ([place])  Abnormal liver function  Other orders -     insulin aspart  protamine - aspart (NOVOLOG MIX 70/30 FLEXPEN) (70-30) 100 UNIT/ML FlexPen; Inject 0.5 mLs (50 Units total) into the skin 3 (three) times daily. INJECT 20 UNITS INTO THE SKIN TWICE DAILY WITH A MEAL -     glucose blood test strip; Test blood sugar twice daily. Pt uses one touch ultra

## 2016-07-21 NOTE — Patient Instructions (Signed)
Starting today, lets change your Novolog 70/30 insulin to 3 times daily   35 units three times daily such as 7:30am, 12 noon, and 5pm  Monday, call or My Chart Message me to let me know how you did over the weekend with sugars readings and the change in dosing  At that point we will probably bump up to 38 units three times daily.   Eat a carb + protein with each meal or snack

## 2016-07-22 ENCOUNTER — Telehealth: Payer: Self-pay

## 2016-07-22 NOTE — Telephone Encounter (Signed)
Call her and pharmacy.  novolog 70/30.  although dose is written for 50u TID to allow for room to titrate, I am having her increase from where she was to 35u TID for now with ability to titrate up.  She has f/u with endocrinology to establish in a few weeks.  We will likely need to go up quite a bit in units in the next week  I spoke to Dr. Dwyane Dee about the case yesterday.

## 2016-07-22 NOTE — Telephone Encounter (Signed)
Franquez, [phone], from the pharmacy called stating they needed clarification on weather she should be taking 40 units daily or 150 units daily that the electronic Rx stated both

## 2016-07-22 NOTE — Telephone Encounter (Signed)
Pt is aware and so is pharmacy 

## 2016-07-25 ENCOUNTER — Telehealth: Payer: Self-pay | Admitting: Internal Medicine

## 2016-07-25 NOTE — Telephone Encounter (Signed)
Pt called stating that her blood sugars have been running between 500-700. Its been like that ever since she began predisone. Pt reading last night was 555 at 8:00pm. This morning she took it and it was 150. She is suppose to take 39 units 3 times a day but she only took 20 units this morning. She just checked BS again and it was 185. Does she need to take the other 19 units that she didn't take or what. Please advise pt.

## 2016-07-25 NOTE — Telephone Encounter (Signed)
She called today at lunch.  Over the weekend sugars stayed in the 300s but this morning had reading of 150, then close to lunch reading of 185, then at the time of the call back around 12:40pm, sugar was 230.   She went up to 35 units TID Friday Novolog mix, then 37 units TID Saturday, 39 units TID Sunday.   This morning she only used 20 u given the lower then recent readings.   At 12:40pm I advised she go ahead and stay at 35 u  TID for now unless sugars are less than 200 pre meal.   Call back tomorrow with update.

## 2016-08-11 ENCOUNTER — Ambulatory Visit (INDEPENDENT_AMBULATORY_CARE_PROVIDER_SITE_OTHER): Payer: BLUE CROSS/BLUE SHIELD | Admitting: Endocrinology

## 2016-08-11 VITALS — BP 138/78 | HR 74 | Ht 64.0 in | Wt 162.0 lb

## 2016-08-11 DIAGNOSIS — Z794 Long term (current) use of insulin: Secondary | ICD-10-CM | POA: Diagnosis not present

## 2016-08-11 DIAGNOSIS — I10 Essential (primary) hypertension: Secondary | ICD-10-CM

## 2016-08-11 DIAGNOSIS — E785 Hyperlipidemia, unspecified: Secondary | ICD-10-CM

## 2016-08-11 DIAGNOSIS — E1165 Type 2 diabetes mellitus with hyperglycemia: Secondary | ICD-10-CM | POA: Diagnosis not present

## 2016-08-11 LAB — URINALYSIS, ROUTINE W REFLEX MICROSCOPIC
Bilirubin Urine: NEGATIVE
HGB URINE DIPSTICK: NEGATIVE
KETONES UR: NEGATIVE
NITRITE: NEGATIVE
Specific Gravity, Urine: 1.015 (ref 1.000–1.030)
TOTAL PROTEIN, URINE-UPE24: NEGATIVE
URINE GLUCOSE: 250 — AB
UROBILINOGEN UA: 1 (ref 0.0–1.0)
pH: 6 (ref 5.0–8.0)

## 2016-08-11 LAB — POCT GLYCOSYLATED HEMOGLOBIN (HGB A1C): Hemoglobin A1C: 9.7

## 2016-08-11 LAB — MICROALBUMIN / CREATININE URINE RATIO
Creatinine,U: 50.9 mg/dL
MICROALB/CREAT RATIO: 1.4 mg/g (ref 0.0–30.0)
Microalb, Ur: <0.7 mg/dL (ref 0.0–1.9)

## 2016-08-11 MED ORDER — INSULIN ASPART 100 UNIT/ML FLEXPEN: PEN_INJECTOR | SUBCUTANEOUS | 4 refills | Status: DC

## 2016-08-11 NOTE — Progress Notes (Signed)
Patient ID: Bethany Webb, female   DOB: Jun 02, 1961, 55 y.o.   MRN: [id number]           Reason for Appointment: Consultation for Type 2 Diabetes  Referring physician: Tysinger   History of Present Illness:          Date of diagnosis of type 2 diabetes mellitus:        Background history:   She has been on various medications over the last few years and followed sporadically for her diabetes She was briefly taking metformin but larger doses she was having GI side effects Was on Xigduo last year but this was stopped when liver functions were normal in 1/16 and insulin started   Recent history:   INSULIN regimen is:  Novolog Mix before meals:  30--45--45     Non-insulin hypoglycemic drugs the patient is taking are: None  Current management, blood sugar patterns and problems identified:  She was started on insulin in December, she thinks this was because her medications for diabetes was stopped when she had abnormalities of her liver function from sarcoidosis.  However the insulin was continued subsequently  She had increase in blood sugars in March when she was prednisone for 6 weeks  Again more recently with another course of prednisone starting in early July blood sugars started going up very significantly 2/500 and she was started on insulin with premixed insulin by her PCP  Her Xigduo tablets have not been resumed since it was stopped in December  She has difficulty watching her diet been taking prednisone and is gaining weight  Current blood sugar patterns show improving blood sugars in the mornings, not consistently normal; significant increase in blood sugars in the early afternoon with only occasional good readings late morning along with persistently significantly high readings at suppertime.    Not checking readings at bedtime or after supper  She is adjusting her pre-meal insulin dose somewhat based on the blood sugar levels but using an unclear correction factor       Side effects from medications have been: Diarrhea from regular metformin  Compliance with the medical regimen: Fair Hypoglycemia:   never  Glucose monitoring:  done 3 times a day         Glucometer:  contour       Blood Glucose readings by time of day and averages from meter download:  PREMEAL Breakfast Midday  Afternoon  Dinner   Overall   Glucose range: 76-378  146-446  297-509  281-585    Median: 193   401  435  332    Self-care: The diet that the patient has been following is: tries to limit bread, sugar, fried food.     Meal times are:  Breakfast is Late morning, Dinner: 7-9 pm  Typical meal intake: Breakfast is Cereal/oatmeal, headache.  Lunch will be chicken salad.  Has low fat meal at dinner.  Snacks: Nuts, cataracts, walnuts, peanut butter               Dietician visit, most recent:none               Exercise: 2/7Days at the gym, low impact aerobics and weights   Weight history:  Wt Readings from Last 3 Encounters:  08/11/16 162 lb (73.5 kg)  07/21/16 153 lb (69.4 kg)  07/01/16 143 lb (64.9 kg)    Glycemic control:   Lab Results  Component Value Date   HGBA1C 9.7 08/11/2016   HGBA1C 7.6 (H)  05/25/2016   HGBA1C 8.8 (H) 11/26/2015   Lab Results  Component Value Date   MICROALBUR <0.7 08/11/2016   LDLCALC 104 (H) 05/01/2015   CREATININE 0.72 06/01/2016   Lab Results  Component Value Date   MICRALBCREAT 1.4 08/11/2016         Medication List       Accurate as of 08/11/16  8:36 PM. Always use your most recent med list.          amLODipine 10 MG tablet Commonly known as:  NORVASC Take 1 tablet (10 mg total) by mouth daily.   azaTHIOprine 50 MG tablet Commonly known as:  IMURAN Take 50 mg by mouth daily.   blood glucose meter kit and supplies Kit Dispense based on patient and insurance preference. Use up to four times daily as directed. (FOR ICD-9 250.00, 250.01).   cetirizine 10 MG tablet Commonly known as:  ZYRTEC Take 10 mg by mouth  daily.   glucose blood test strip Test blood sugar twice daily. Pt uses one touch ultra   ibuprofen 800 MG tablet Commonly known as:  ADVIL,MOTRIN TAKE 1 TABLET(800 MG) BY MOUTH EVERY 8 HOURS AS NEEDED   insulin aspart 100 UNIT/ML FlexPen Commonly known as:  NOVOLOG Inject 20 units at lunch and 15 units with dinner.   insulin aspart protamine - aspart (70-30) 100 UNIT/ML FlexPen Commonly known as:  NOVOLOG MIX 70/30 FLEXPEN Inject 0.5 mLs (50 Units total) into the skin 3 (three) times daily. INJECT 20 UNITS INTO THE SKIN TWICE DAILY WITH A MEAL   Lancets 30G Misc Test blood sugars twice daily   lisinopril-hydrochlorothiazide 20-25 MG tablet Commonly known as:  PRINZIDE,ZESTORETIC Take 1 tablet by mouth daily.   Pen Needles 32G X 4 MM Misc 1 each by Does not apply route daily.   predniSONE 10 MG tablet Commonly known as:  DELTASONE Take 10 mg by mouth 3 (three) times daily.   tetrahydrozoline 0.05 % ophthalmic solution Place 1 drop into both eyes 2 (two) times daily as needed (dry eye).       Allergies:  Allergies  Allergen Reactions  . Codeine Nausea Only  . Metformin And Related     Diarrhea, nausea    Past Medical History:  Diagnosis Date  . 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 Allergy   . Biceps tendonitis 2/12   Dr. Gladstone Lighter  . Diabetes mellitus    type II  . History of blood transfusion 1988   hysterectomy  . History of mammogram 05/25/12   stable, no suspicious finding, extremely dense breast tissue  . Hyperlipidemia   . Hypertension   . Neutropenia, unspecified ([place]) 02/2013   slight, likely ethnicity related  . Pneumococcal vaccine refused 03/2013  . Refused influenza vaccine 03/2013  . S/P hysterectomy   . Sarcoidosis ([place])   . Shortness of breath dyspnea    with exertion   . Supraspinatus tendonitis 2/12   Dr. Gladstone Lighter  . Wears glasses     Past Surgical  History:  Procedure Laterality Date  . ABDOMINAL HYSTERECTOMY  1990   and BSO; endometriosis  . ANTERIOR CERVICAL DECOMP/DISCECTOMY FUSION N/A 06/20/2013   Procedure: ANTERIOR CERVICAL DECOMPRESSION/DISCECTOMY FUSION 1 LEVEL;  Surgeon: Ophelia Charter, MD;  Location: [facility] [facility];  Service: Neurosurgery;  Laterality: N/A;  C45 anterior cervical decompression with fusion interbody prothesis plating and bonegraft  . BREAST REDUCTION SURGERY  [place] Right 06/20/2013   Procedure: CARPAL TUNNEL RELEASE;  Surgeon: Ophelia Charter, MD;  Location: [place] [facility];  Service: Neurosurgery;  Laterality: Right;  RIGHT carpal tunnel release  . COLONOSCOPY  03/06/12   sigmoid polyp; repeat 10 years; Dr. Tora Duck  . LUNG SURGERY  1987   endometriosis in lungs  . SUPRACLAVICAL NODE BIOPSY Left 01/14/2016   Procedure: LEFT SUPRACLAVICAL NODE BIOPSY;  Surgeon: Melrose Nakayama, MD;  Location: [facility];  Service: Thoracic;  Laterality: Left;    Family History  Problem Relation Age of Onset  . Cancer Paternal Grandmother     breast  . Diabetes Other     maternal and paternal grandmother  . Heart disease Father 56    died of MI, CABG   . Multiple sclerosis Sister   . Asthma Brother   . Cancer Maternal Uncle     hematologic  . Hypertension Maternal Grandmother   . Cancer Maternal Grandfather     prostate  . Hypertension Maternal Grandfather   . Asthma Brother   . Eczema Sister   . Allergies Sister   . Healthy Daughter     Social History:  reports that she has never smoked. She has never used smokeless tobacco. She reports that she drinks alcohol. She reports that she does not use drugs.   Review of Systems  Constitutional: Positive for weight gain. Negative for reduced appetite.  HENT: Negative for trouble swallowing.   Respiratory: Negative for shortness of breath.   Cardiovascular: Negative for leg swelling.  Gastrointestinal: Negative for nausea and abdominal  pain.  Endocrine: Positive for fatigue and polydipsia.       Overall mild fatigue  Genitourinary: Positive for frequency.  Musculoskeletal: Negative for joint pain.  Skin: Negative for rash.  Neurological: Positive for numbness and tingling.       Burning, not now  Psychiatric/Behavioral: Negative for insomnia.     Lipid history: Not done rx now, previous treatment was stopped in 12/16    Lab Results  Component Value Date   CHOL 172 05/01/2015   HDL 56 05/01/2015   LDLCALC 104 (H) 05/01/2015   TRIG 60 05/01/2015   CHOLHDL 3.1 05/01/2015           Hypertension:Treated since 1997  Most recent eye exam was 3/17, reportedly normal  Most recent foot exam: 8/17  Prednisone given for sarcoidosis  in 3/17 for 6 weeks, again started in early July, dosage 10 mg tid till last Thursday   LABS:  Office Visit on 08/11/2016  Component Date Value Ref Range Status  . Microalb, Ur 08/11/2016 <0.7  0.0 - 1.9 mg/dL Final  . Creatinine,U 08/11/2016 50.9  mg/dL Final  . Microalb Creat Ratio 08/11/2016 1.4  0.0 - 30.0 mg/g Final  . Color, Urine 08/11/2016 YELLOW  Yellow;Lt. Yellow Final  . APPearance 08/11/2016 CLEAR  Clear Final  . Specific Gravity, Urine 08/11/2016 1.015  1.000 - 1.030 Final  . pH 08/11/2016 6.0  5.0 - 8.0 Final  . Total Protein, Urine 08/11/2016 NEGATIVE  Negative Final  . Urine Glucose 08/11/2016 250* Negative Final  . Ketones, ur 08/11/2016 NEGATIVE  Negative Final  . Bilirubin Urine 08/11/2016 NEGATIVE  Negative Final  . Hgb urine dipstick 08/11/2016 NEGATIVE  Negative Final  . Urobilinogen, UA 08/11/2016 1.0  0.0 - 1.0 Final  . Leukocytes, UA 08/11/2016 MODERATE* Negative Final  . Nitrite 08/11/2016 NEGATIVE  Negative Final  . WBC, UA 08/11/2016  3-6/hpf* 0-2/hpf Final  . RBC / HPF 08/11/2016 0-2/hpf  0-2/hpf Final  . Squamous Epithelial / LPF 08/11/2016 Rare(0-4/hpf)  Rare(0-4/hpf) Final  . Hemoglobin A1C 08/11/2016 9.7   Final    Physical Examination:  BP  138/78   Pulse 74   Ht '5\' 4"'  (1.626 m)   Wt 162 lb (73.5 kg)   LMP 12/27/1987   BMI 27.81 kg/m   GENERAL:         Patient has generalized obesity.   HEENT:         Eye exam shows normal external appearance. Fundus exam shows no retinopathy. Oral exam shows normal mucosa, Does have several fillings in her teeth .  NECK:   There is no lymphadenopathy Thyroid is not enlarged and no nodules felt.  Carotids are normal to palpation and no bruit heard LUNGS:         Chest is symmetrical. Lungs are clear to auscultation.Marland Kitchen   HEART:         Heart sounds:  S1 and S2 are normal. No murmur or click heard., no S3 or S4.   ABDOMEN:   There is no distention present. Liver and spleen are not palpable. No other mass or tenderness present.   NEUROLOGICAL:   Ankle jerks are absent bilaterally.    Diabetic Foot Exam - Simple   Simple Foot Form Diabetic Foot exam was performed with the following findings:  Yes 08/11/2016  3:05 PM  Visual Inspection No deformities, no ulcerations, no other skin breakdown bilaterally:  Yes Sensation Testing Intact to touch and monofilament testing bilaterally:  Yes Pulse Check Posterior Tibialis and Dorsalis pulse intact bilaterally:  Yes Comments            Vibration sense is Minimally reduced in distal first toes. MUSCULOSKELETAL:  There is no swelling or deformity of the peripheral joints. Spine is normal to inspection.   EXTREMITIES:     There is no edema. No skin lesions present. SKIN:       No rash or lesions of concern.        ASSESSMENT:  Diabetes type 2, uncontrolled    See history of present illness for detailed discussion of current diabetes management, blood sugar patterns and problems identified Her diabetes has not been adequately controlled in the past  She has had marked hyperglycemia recently with taking prednisone  Currently even with large doses of premixed insulin the blood sugars are markedly increased and only slightly better since stopping  prednisone last week She has mostly high blood sugars in the afternoons and evenings which is typical for prednisone Fasting blood sugars are relatively better and near normal at times Does not check readings after evening meal  Complications of diabetes: None evident  History of hyperlipidemia: Currently untreated and needs follow-up lipids and restarting statin drug if indicated  HYPERTENSION: Appears well-controlled, normal urine microalbumin  PLAN:    Since she has a relatively large increase in blood sugars after breakfast and lunch will add NovoLog to her NovoLog mix insulin for convenience with starting doses of 20 units at lunch and 15 at supper time.  Discussed timing of mealtime insulin, duration of action and adjustment of dose as well as targeting postprandial readings  Not clear how much insulin she will need at suppertime since she does not monitor after supper and she will also start checking readings 2-3 hours after evening meal  Also for now will increase her premixed insulin to 55 units at lunchtime, reduce  suppertime dose to avoid overnight hypoglycemia  Once she runs out of premixed insulin may consider using separate basal bolus regimen  To help insulin action and prevent weight gain she will restart her Xigduo 09/999 at breakfast, she may not be tolerated additional metformin because of previous GI side effects Also discussed possibility of recurrence of vaginal candidiasis and need to treat this if needed  Encouraged her to increase exercise  She was also be followed up by diabetes nurse educator for further adjustment and review of day-to-day management  While starting the Xigduo tablet she will reduce her lisinopril HCT to a half tablet  Will need follow-up renal function  Patient Instructions  Continue checking sugars before meals and at least every other day check it at bedtime  NOVOLOG mix insulin: Increase the dose to 35 units before breakfast and 55  units at lunchtime, REDUCE the dose to 40 units at suppertime  [employer] will be a fast acting insulin to be taken at lunch and supper as follows:  Take 20 units before lunch and 15 units before supper  Take the Xigduo tablet in the morning with breakfast If the blood sugars are starting to get below 100 especially in the morning call for insulin adjustment    Counseling time on subjects discussed above is over 50% of today's 60 minute visit   Keileigh Vahey 08/11/2016, 8:36 PM   Note: This office note was prepared with Dragon voice recognition system technology. Any transcriptional errors that result from this process are unintentional.

## 2016-08-11 NOTE — Patient Instructions (Addendum)
Continue checking sugars before meals and at least every other day check it at bedtime  NOVOLOG mix insulin: Increase the dose to 35 units before breakfast and 55 units at lunchtime, REDUCE the dose to 40 units at suppertime  [facility] pen will be a fast acting insulin to be taken at lunch and supper as follows:  Take 20 units before lunch and 15 units before supper  Take the Xigduo tablet in the morning with breakfast If the blood sugars are starting to get below 100 especially in the morning call for insulin adjustment

## 2016-08-12 ENCOUNTER — Telehealth: Payer: Self-pay | Admitting: Endocrinology

## 2016-08-12 ENCOUNTER — Telehealth: Payer: Self-pay

## 2016-08-12 NOTE — Telephone Encounter (Signed)
Pt called the office to report that her glucose right now is under 100- (99) before eating. She states that she usually takes 20 units of Novolog, and then 55 units of the Novolog 70/30. She inquires if she needs to continue the 55 units since her glucose is under 100. Please advise. Pt can be reached back at [phone].  Bethany Webb

## 2016-08-12 NOTE — Telephone Encounter (Signed)
PT called and wanted to know, does she need to take her insulins if her sugar is under 100?

## 2016-08-12 NOTE — Telephone Encounter (Signed)
She is seeing endocrinology now for diabetes, so she should check with him first.   My thought would be to back off novolog meal time by 3-4 units.  We want to avoid hypoglycemia.  But if she stopped prednisone, she needs to call Dr. Dwyane Dee anyhow.

## 2016-08-12 NOTE — Telephone Encounter (Signed)
Has she stopped prednisone?  Verify her insulin amounts, as this doesn't sound right?

## 2016-08-12 NOTE — Telephone Encounter (Signed)
Has stopped prednisone. Sugar reading are: 146, 289, 350, 176, and 99. Novolog PLAIN 20 (at lunch and supper) and Novolog 70/30 55 units(at breakfast, lunch and dinner).

## 2016-08-12 NOTE — Telephone Encounter (Signed)
Pt is aware and has already called his ofice but got no response

## 2016-08-15 NOTE — Telephone Encounter (Signed)
Yes but she can reduce her dose by 5 units if it is below 100. Also let us know if she starts getting blood sugars below about 80

## 2016-08-15 NOTE — Telephone Encounter (Signed)
See note and please advise, Thanks!  

## 2016-08-16 NOTE — Telephone Encounter (Signed)
I contacted the pt and she stated the only insulin she is taking is the Novolog 70/30.  Pt reported the past few days blood sugar readings.  08/12/2016: Fasting 171 08/13/2016: Fasting 84 08/14/2016: Fasting 122 08/15/2016: Fasting: 117  08/16/2016: Fasting: 158   Pt confirmed her insulin dosage at this time is Novolog 70/30 25 units tid. Please advise.  Thanks!

## 2016-08-16 NOTE — Telephone Encounter (Signed)
I contacted the pt and advised MD's instructions. Pt voiced understanding.

## 2016-08-16 NOTE — Telephone Encounter (Signed)
She can continue the same doses, she can reduce the suppertime dose to 20 units if the morning sugars go below 100.  Also make sure she is checking sugars after dinner

## 2016-08-17 ENCOUNTER — Ambulatory Visit (INDEPENDENT_AMBULATORY_CARE_PROVIDER_SITE_OTHER): Payer: BLUE CROSS/BLUE SHIELD | Admitting: Family Medicine

## 2016-08-17 ENCOUNTER — Encounter: Payer: Self-pay | Admitting: Family Medicine

## 2016-08-17 VITALS — BP 116/74 | HR 106 | Temp 98.4°F | Resp 99 | Wt 156.0 lb

## 2016-08-17 DIAGNOSIS — E1169 Type 2 diabetes mellitus with other specified complication: Secondary | ICD-10-CM

## 2016-08-17 DIAGNOSIS — E785 Hyperlipidemia, unspecified: Secondary | ICD-10-CM

## 2016-08-17 DIAGNOSIS — D8689 Sarcoidosis of other sites: Secondary | ICD-10-CM

## 2016-08-17 DIAGNOSIS — E118 Type 2 diabetes mellitus with unspecified complications: Secondary | ICD-10-CM

## 2016-08-17 DIAGNOSIS — K7689 Other specified diseases of liver: Secondary | ICD-10-CM | POA: Diagnosis not present

## 2016-08-17 DIAGNOSIS — R945 Abnormal results of liver function studies: Secondary | ICD-10-CM

## 2016-08-17 MED ORDER — PRAVASTATIN SODIUM 20 MG PO TABS: 20.0000 mg | ORAL_TABLET | Freq: Every day | ORAL | 3 refills | Status: DC

## 2016-08-17 NOTE — Progress Notes (Signed)
   Subjective:    Patient ID: Bethany Webb, female    DOB: 08-26-1961, 55 y.o.   MRN: [id number]  HPI He is here for consultation concerning her medications. 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 During this process she was taken off of several medications due to the liver problems she was having. She was also placed on steroids which did cause her diabetes to become very difficult to control. She is now seeing Dr. Dwyane Dee for that. Presently she is not taking any prednisone. She is followed by Dr. Paulita Fujita for her liver related problems. She is taking Imuran. She has been off her statin for quite some time due to his underlying liver problems and not known whether problem originally came from.  Review of Systems     Objective:   Physical Exam Alert and in no distress otherwise not examined       Assessment & Plan:  Hyperlipidemia associated with type 2 diabetes mellitus ([place]) - Plan: pravastatin (PRAVACHOL) 20 MG tablet  Diabetes mellitus with complication ([facility])  Sarcoidosis of other sites ([place])  Abnormal liver function  After a discussion with her I think it is appropriate to place her back on her Pravachol. We will monitor her lipids. She will continue to be followed by GI and by Dr. Dwyane Dee.

## 2016-08-22 ENCOUNTER — Ambulatory Visit (INDEPENDENT_AMBULATORY_CARE_PROVIDER_SITE_OTHER): Payer: BLUE CROSS/BLUE SHIELD | Admitting: Pulmonary Disease

## 2016-08-22 ENCOUNTER — Other Ambulatory Visit (INDEPENDENT_AMBULATORY_CARE_PROVIDER_SITE_OTHER): Payer: BLUE CROSS/BLUE SHIELD

## 2016-08-22 ENCOUNTER — Encounter: Payer: Self-pay | Admitting: Pulmonary Disease

## 2016-08-22 VITALS — BP 114/78 | HR 90 | Ht 64.0 in | Wt 158.2 lb

## 2016-08-22 DIAGNOSIS — D860 Sarcoidosis of lung: Secondary | ICD-10-CM

## 2016-08-22 LAB — CBC
HCT: 36.9 % (ref 36.0–46.0)
Hemoglobin: 12.9 g/dL (ref 12.0–15.0)
MCHC: 35 g/dL (ref 30.0–36.0)
MCV: 85.5 fl (ref 78.0–100.0)
Platelets: 407 10*3/uL — ABNORMAL HIGH (ref 150.0–400.0)
RBC: 4.32 Mil/uL (ref 3.87–5.11)
RDW: 15 % (ref 11.5–15.5)
WBC: 3.4 10*3/uL — AB (ref 4.0–10.5)

## 2016-08-22 LAB — COMPREHENSIVE METABOLIC PANEL
ALBUMIN: 4.1 g/dL (ref 3.5–5.2)
ALK PHOS: 264 U/L — AB (ref 39–117)
ALT: 69 U/L — ABNORMAL HIGH (ref 0–35)
AST: 80 U/L — AB (ref 0–37)
BUN: 17 mg/dL (ref 6–23)
CO2: 28 mEq/L (ref 19–32)
CREATININE: 0.89 mg/dL (ref 0.40–1.20)
Calcium: 9.4 mg/dL (ref 8.4–10.5)
Chloride: 100 mEq/L (ref 96–112)
GFR: 83.93 mL/min (ref >60.00)
Glucose, Bld: 72 mg/dL (ref 70–99)
POTASSIUM: 3.5 meq/L (ref 3.5–5.1)
SODIUM: 140 meq/L (ref 135–145)
TOTAL PROTEIN: 7.6 g/dL (ref 6.0–8.3)
Total Bilirubin: 1.2 mg/dL (ref 0.2–1.2)

## 2016-08-22 NOTE — Patient Instructions (Signed)
Lab tests today  Will arrange for CT scan of chest and call with results  Will get copy of office records from Dr. Paulita Fujita with [facility]  Follow up in 6 months

## 2016-08-22 NOTE — Progress Notes (Signed)
Current Outpatient Prescriptions on File Prior to Visit  Medication Sig  . amLODipine (NORVASC) 10 MG tablet Take 1 tablet (10 mg total) by mouth daily.  Marland Kitchen azaTHIOprine (IMURAN) 50 MG tablet Take 50 mg by mouth daily.  . blood glucose meter kit and supplies KIT Dispense based on patient and insurance preference. Use up to four times daily as directed. (FOR ICD-9 250.00, 250.01).  . Dapagliflozin-Metformin HCl ER (XIGDUO XR) 09-999 MG TB24 Take by mouth. One a day  . glucose blood test strip Test blood sugar twice daily. Pt uses one touch ultra  . ibuprofen (ADVIL,MOTRIN) 800 MG tablet TAKE 1 TABLET(800 MG) BY MOUTH EVERY 8 HOURS AS NEEDED  . insulin aspart protamine - aspart (NOVOLOG MIX 70/30 FLEXPEN) (70-30) 100 UNIT/ML FlexPen Inject 0.5 mLs (50 Units total) into the skin 3 (three) times daily. INJECT 20 UNITS INTO THE SKIN TWICE DAILY WITH A MEAL  . Insulin Pen Needle (PEN NEEDLES) 32G X 4 MM MISC 1 each by Does not apply route daily.  . Lancets 30G MISC Test blood sugars twice daily  . lisinopril-hydrochlorothiazide (PRINZIDE,ZESTORETIC) 20-25 MG tablet Take 1 tablet by mouth daily.  . pravastatin (PRAVACHOL) 20 MG tablet Take 1 tablet (20 mg total) by mouth daily.  . predniSONE (DELTASONE) 10 MG tablet Take 10 mg by mouth 3 (three) times daily. 6 weeks on and 6 weeks off   No current facility-administered medications on file prior to visit.      Chief Complaint  Patient presents with  . Follow-up    Pt reports breathing is stable. C/o fatigue, dry cough.     Pulmonary tests CT chest 01/04/16 >> mediastinal and b/l hilar LAN, 8 mm RML nodule, b/l pleural plaques Labs 01/07/16 >> ESR 25, ACE < 15 Quantiferon Gold 01/21/16 >> negative Lt supraclavicular LN bx 01/14/16 >> non caseating granumola PET scan 5/63/89 >> hypermetabolic LAN in lower neck and chest PFT 01/29/16 >> FEV1 1.81 (83%), FEV1% 78, TLC 3.62 (71%), DLCO 68%  Cardiac tests Echo 01/29/16 >> EF 55 to 60%, grade 1  diastolic dysfx, mild MR, PAS 34 mmHg  Past medical hx HTN, diastolic CHF, HLD, DM  Past surgical hx, Allergies, Family hx, Social hx all reviewed.  Vital Signs BP 114/78 (BP Location: Left Arm, Cuff Size: Normal)   Pulse 90   Ht '5\' 4"'  (1.626 m)   Wt 158 lb 3.2 oz (71.8 kg)   LMP 12/27/1987   SpO2 100%   BMI 27.15 kg/m   History of Present Illness Bethany Webb is a 55 y.o. 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  Her breathing has been okay.  She gets intermittent coughing episodes.  Denies sputum, wheeze, or hemoptysis.  She gets chest discomfort few times per week >> goes away after few minutes.  She takes prednisone every 6 weeks for liver problem >> sees Dr. Paulita Fujita.  She was seen by dermatology about 2 yrs ago for lesion on nose >> was told it wasn't sarcoid, but lesion has been growing.  Physical Exam  General - No distress ENT - No sinus tenderness, no oral exudate, no LAN, MP 3 Cardiac - s1s2 regular, no murmur Chest - No wheeze/rales/dullness Back - No focal tenderness Abd - Soft, non-tender Ext - No edema Neuro - Normal strength Skin - flat, dark colored macule on nose Psych - normal mood, and behavior   Assessment/Plan  Pulmonary sarcoidosis. - will repeat CT chest with contrast  Skin lesion on nose. - advised her  to f/u with dermatology  Hepatic sarcoidosis. - she is on prednisone, imuran from GI - will try to get medical records from Dr. Erlinda Hong office  Vaccinations. - explained that pneumococcal and influenza vaccinations are indicated given her dx of sarcoidosis >> she declined both at this time    Patient Instructions  Lab tests today  Will arrange for CT scan of chest and call with results  Will get copy of office records from Dr. Paulita Fujita with [facility]  Follow up in 6 months    Chesley Mires, MD [place] [facility] Pager:  [phone] 08/22/2016, 9:20 AM

## 2016-08-23 ENCOUNTER — Encounter: Payer: BLUE CROSS/BLUE SHIELD | Admitting: Nutrition

## 2016-08-24 ENCOUNTER — Other Ambulatory Visit: Payer: Self-pay | Admitting: Medical

## 2016-08-24 ENCOUNTER — Encounter (INDEPENDENT_AMBULATORY_CARE_PROVIDER_SITE_OTHER): Payer: Self-pay

## 2016-08-24 ENCOUNTER — Ambulatory Visit (INDEPENDENT_AMBULATORY_CARE_PROVIDER_SITE_OTHER)
Admission: RE | Admit: 2016-08-24 | Discharge: 2016-08-24 | Disposition: A | Discharge status: Home / Self Care | Payer: BLUE CROSS/BLUE SHIELD | Source: Ambulatory Visit | Attending: Pulmonary Disease | Admitting: Pulmonary Disease

## 2016-08-24 DIAGNOSIS — D860 Sarcoidosis of lung: Secondary | ICD-10-CM | POA: Diagnosis not present

## 2016-08-24 MED ORDER — IOPAMIDOL (ISOVUE-300) INJECTION 61%
80.0000 mL | Freq: Once | INTRAVENOUS | Status: AC | PRN
  Administered 2016-08-24: 80 mL via INTRAVENOUS

## 2016-08-25 NOTE — Telephone Encounter (Signed)
Is this ok to refill?  

## 2016-08-31 ENCOUNTER — Encounter: Payer: BLUE CROSS/BLUE SHIELD | Attending: Endocrinology | Admitting: Nutrition

## 2016-08-31 DIAGNOSIS — E119 Type 2 diabetes mellitus without complications: Secondary | ICD-10-CM | POA: Insufficient documentation

## 2016-08-31 DIAGNOSIS — Z713 Dietary counseling and surveillance: Secondary | ICD-10-CM | POA: Diagnosis not present

## 2016-08-31 DIAGNOSIS — E118 Type 2 diabetes mellitus with unspecified complications: Secondary | ICD-10-CM

## 2016-08-31 NOTE — Patient Instructions (Signed)
Test blood sugars before breakfast and lunch for a few days.  If blood sugar is high before lunch, consider taking 5u of Novolog before respective meals that set blood sugar up.   Test before lunch and supper, if blood sugar is high before certain meals, or when drinking sweet drinks, consider taking 5 extra units of Novolog.   Test before supper and bedtime.  If blood sugar is high at bedtime, will need some Novolog before supper meal.   Call if questions.

## 2016-08-31 NOTE — Assessment & Plan Note (Addendum)
Patient is here for review of blood sugars and insulin dose.  She is not on steroids at this time (6 weeks on, 6 weeks off) She is currently taking only 25u of Novolog before lunch only.   She tests her blood sugars fasting, and occasionally before lunch and supper. 14 day average: 154 FBSs 79-122 with occasional 170s,   AcL: 86-220 (takes 25u before all meals), HS: 112-284. Discussed the importance of testing blood sugars to determine if her own body's insulin is covering her meals.  She will test acB and acL.  If blood sugar is less than 150, she needs no Novolog.  If blood sugar is in the 200s (like today with oatmeal, "lots of raisins" and nuts) she will need about 5u of Novolog.  She reported good understanding of this.  She will then test acL and acS.  If high, with certain meals at lunch, like when she drinks sweet drinks, she will take 2-4 extra units of Novolog.  She reported good understanding of this.  If eating fast food at supper and/or pasta, she will test her blood sugar at HS to see if she needs Novolog before supper.  She reported good understanding of this, and  will call if questions. Pt. Is very concerned about weight gain.  Snacking on cheese, nuts, and crackers between meals.  Gave suggestions for lower calorie snacks and suggested she see a dietitian for weight management. She is unable to exercise due to enlarged liver.  Suggested she keep close attention to total daily calories,and limit them to 1500-1700/day.

## 2016-09-02 ENCOUNTER — Other Ambulatory Visit (INDEPENDENT_AMBULATORY_CARE_PROVIDER_SITE_OTHER): Payer: BLUE CROSS/BLUE SHIELD

## 2016-09-02 DIAGNOSIS — Z794 Long term (current) use of insulin: Secondary | ICD-10-CM

## 2016-09-02 DIAGNOSIS — E1165 Type 2 diabetes mellitus with hyperglycemia: Secondary | ICD-10-CM

## 2016-09-02 LAB — LIPID PANEL
CHOL/HDL RATIO: 4
CHOLESTEROL: 250 mg/dL — AB (ref 0–200)
HDL: 60.6 mg/dL (ref >39.00)
LDL CALC: 175 mg/dL — AB (ref 0–99)
NONHDL: 189.44
Triglycerides: 72 mg/dL (ref 0.0–149.0)
VLDL: 14.4 mg/dL (ref 0.0–40.0)

## 2016-09-02 LAB — COMPREHENSIVE METABOLIC PANEL
ALBUMIN: 4.2 g/dL (ref 3.5–5.2)
ALT: 48 U/L — ABNORMAL HIGH (ref 0–35)
AST: 56 U/L — AB (ref 0–37)
Alkaline Phosphatase: 295 U/L — ABNORMAL HIGH (ref 39–117)
BUN: 15 mg/dL (ref 6–23)
CHLORIDE: 100 meq/L (ref 96–112)
CO2: 32 mEq/L (ref 19–32)
CREATININE: 0.91 mg/dL (ref 0.40–1.20)
Calcium: 9.5 mg/dL (ref 8.4–10.5)
GFR: 81.79 mL/min (ref >60.00)
GLUCOSE: 140 mg/dL — AB (ref 70–99)
Potassium: 3.5 mEq/L (ref 3.5–5.1)
SODIUM: 139 meq/L (ref 135–145)
Total Bilirubin: 1 mg/dL (ref 0.2–1.2)
Total Protein: 7.6 g/dL (ref 6.0–8.3)

## 2016-09-03 LAB — FRUCTOSAMINE: FRUCTOSAMINE: 355 umol/L — AB (ref 0–285)

## 2016-09-07 ENCOUNTER — Encounter: Payer: Self-pay | Admitting: Endocrinology

## 2016-09-07 ENCOUNTER — Ambulatory Visit (INDEPENDENT_AMBULATORY_CARE_PROVIDER_SITE_OTHER): Payer: BLUE CROSS/BLUE SHIELD | Admitting: Endocrinology

## 2016-09-07 VITALS — BP 136/86 | HR 83 | Ht 64.0 in | Wt 158.0 lb

## 2016-09-07 DIAGNOSIS — E1165 Type 2 diabetes mellitus with hyperglycemia: Secondary | ICD-10-CM | POA: Diagnosis not present

## 2016-09-07 DIAGNOSIS — E785 Hyperlipidemia, unspecified: Secondary | ICD-10-CM | POA: Diagnosis not present

## 2016-09-07 DIAGNOSIS — I10 Essential (primary) hypertension: Secondary | ICD-10-CM

## 2016-09-07 DIAGNOSIS — Z794 Long term (current) use of insulin: Secondary | ICD-10-CM | POA: Diagnosis not present

## 2016-09-07 NOTE — Patient Instructions (Signed)
Check blood sugars on waking up  3x weekly  Also check blood sugars about 2 hours after a meal and do this after different meals by rotation  Recommended blood sugar levels on waking up is 90-130 and about 2 hours after meal is 130-160  Please bring your blood sugar monitor to each visit, thank you  ? Need Novolog at supper

## 2016-09-07 NOTE — Progress Notes (Signed)
Patient ID: Bethany Webb, female   DOB: 09/11/1961, 55 y.o.   MRN: [id number]           Reason for Appointment: Follow-up for Type 2 Diabetes  Referring physician: Tysinger   History of Present Illness:          Date of diagnosis of type 2 diabetes mellitus:        Background history:   She has been on various medications over the last few years and followed sporadically for her diabetes She was briefly taking metformin but larger doses she was having GI side effects Was on Xigduo last year but this was stopped when liver functions were normal in 1/16 and insulin started  Her [name] was stopped in December  Recent history:   INSULIN regimen is:  Novolog Mix:  25 at lunch     Non-insulin hypoglycemic drugs the patient is taking are: Xigduo 09/999 daily   Current management, blood sugar patterns and problems identified:  Because of marked hyperglycemia especially postprandially after a course of her steroids she was started on Novolog insulin with meals in addition to her NovoLog mix insulin.  However because of her going off of the steroids her blood sugars started coming down fairly quickly.  She says that since her blood sugars were low normal she did not take the Novolog for long  Also she was starting to get relatively low blood sugars at breakfast and suppertime and on her own she stopped taking the NovoLog mix at breakfast and suppertime  Despite instructions she has not checked her sugars after supper and not clear these are controlled with her not taking any Novolog Mix at suppertime lately  Her fructosamine is still high indicating overall hyperglycemia.  She is tolerating Xigduo and her weight is slightly better.  She is generally trying to watch her diet and has had a discussion with nurse educator.  Has not done much exercise because of her back pain recently       Side effects from medications have been: Diarrhea from regular metformin  Compliance with  the medical regimen: Fair Hypoglycemia:   never  Glucose monitoring:  done 3 times a day         Glucometer:  contour       Blood Glucose readings by time of day and averages by recall  Mean values apply above for all meters except median for One Touch  PRE-MEAL Fasting Lunch Dinner Bedtime Overall  Glucose range: 70-117 125-140 130-160 ?   Mean/median:        Self-care: The diet that the patient has been following is: tries to limit bread, sugar, fried food.     Meal times are:  Breakfast is Late morning, Dinner: 7-9 pm  Typical meal intake: Breakfast is Cereal/oatmeal.  Lunch will be chicken salad.  Has low fat meal at dinner.  Snacks: Nuts, walnuts, peanut butter  No regular soft drinks               Dietician visit, most recent:none               CDE: 9/17 Exercise: walking a little, chair exercises and weights   Weight history:  Wt Readings from Last 3 Encounters:  09/07/16 158 lb (71.7 kg)  08/22/16 158 lb 3.2 oz (71.8 kg)  08/17/16 156 lb (70.8 kg)    Glycemic control:   Lab Results  Component Value Date   HGBA1C 9.7 08/11/2016   HGBA1C 7.6 (H)  05/25/2016   HGBA1C 8.8 (H) 11/26/2015   Lab Results  Component Value Date   MICROALBUR <0.7 08/11/2016   LDLCALC 175 (H) 09/02/2016   CREATININE 0.91 09/02/2016   Lab Results  Component Value Date   MICRALBCREAT 1.4 08/11/2016   Lab on 09/02/2016  Component Date Value Ref Range Status  . Sodium 09/02/2016 139  135 - 145 mEq/L Final  . Potassium 09/02/2016 3.5  3.5 - 5.1 mEq/L Final  . Chloride 09/02/2016 100  96 - 112 mEq/L Final  . CO2 09/02/2016 32  19 - 32 mEq/L Final  . Glucose, Bld 09/02/2016 140* 70 - 99 mg/dL Final  . BUN 09/02/2016 15  6 - 23 mg/dL Final  . Creatinine, Ser 09/02/2016 0.91  0.40 - 1.20 mg/dL Final  . Total Bilirubin 09/02/2016 1.0  0.2 - 1.2 mg/dL Final  . Alkaline Phosphatase 09/02/2016 295* 39 - 117 U/L Final  . AST 09/02/2016 56* 0 - 37 U/L Final  . ALT 09/02/2016 48* 0 - 35 U/L  Final  . Total Protein 09/02/2016 7.6  6.0 - 8.3 g/dL Final  . Albumin 09/02/2016 4.2  3.5 - 5.2 g/dL Final  . Calcium 09/02/2016 9.5  8.4 - 10.5 mg/dL Final  . GFR 09/02/2016 81.79  >60.00 mL/min Final  . Fructosamine 09/03/2016 355* 0 - 285 umol/L Final   Comment: Published reference interval for apparently healthy subjects between age 52 and 37 is 40 - 285 umol/L and in a poorly controlled diabetic population is 228 - 563 umol/L with a mean of 396 umol/L.   Marland Kitchen Cholesterol 09/02/2016 250* 0 - 200 mg/dL Final  . Triglycerides 09/02/2016 72.0  0.0 - 149.0 mg/dL Final  . HDL 09/02/2016 60.60  >39.00 mg/dL Final  . VLDL 09/02/2016 14.4  0.0 - 40.0 mg/dL Final  . LDL Cholesterol 09/02/2016 175* 0 - 99 mg/dL Final  . Total CHOL/HDL Ratio 09/02/2016 4   Final  . NonHDL 09/02/2016 189.44   Final         Medication List       Accurate as of 09/07/16  9:44 PM. Always use your most recent med list.          amLODipine 10 MG tablet Commonly known as:  NORVASC Take 1 tablet (10 mg total) by mouth daily.   azaTHIOprine 50 MG tablet Commonly known as:  IMURAN Take 50 mg by mouth daily.   blood glucose meter kit and supplies Kit Dispense based on patient and insurance preference. Use up to four times daily as directed. (FOR ICD-9 250.00, 250.01).   glucose blood test strip Test blood sugar twice daily. Pt uses one touch ultra   ibuprofen 800 MG tablet Commonly known as:  ADVIL,MOTRIN TAKE 1 TABLET(800 MG) BY MOUTH EVERY 8 HOURS AS NEEDED   insulin aspart protamine - aspart (70-30) 100 UNIT/ML FlexPen Commonly known as:  NOVOLOG MIX 70/30 FLEXPEN Inject 0.5 mLs (50 Units total) into the skin 3 (three) times daily. INJECT 20 UNITS INTO THE SKIN TWICE DAILY WITH A MEAL   Lancets 30G Misc Test blood sugars twice daily   lisinopril-hydrochlorothiazide 20-25 MG tablet Commonly known as:  PRINZIDE,ZESTORETIC Take 1 tablet by mouth daily.   Pen Needles 32G X 4 MM Misc 1 each by  Does not apply route daily.   pravastatin 20 MG tablet Commonly known as:  PRAVACHOL Take 1 tablet (20 mg total) by mouth daily.   predniSONE 10 MG tablet Commonly known as:  DELTASONE Take  10 mg by mouth 3 (three) times daily. 6 weeks on and 6 weeks off   XIGDUO XR 09-999 MG Tb24 Generic drug:  Dapagliflozin-Metformin HCl ER Take by mouth. One a day       Allergies:  Allergies  Allergen Reactions  . Codeine Nausea Only  . Metformin And Related     Diarrhea, nausea    Past Medical History:  Diagnosis Date  . 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 Allergy   . Biceps tendonitis 2/12   Dr. Gladstone Lighter  . Diabetes mellitus    type II  . History of blood transfusion 1988   hysterectomy  . History of mammogram 05/25/12   stable, no suspicious finding, extremely dense breast tissue  . Hyperlipidemia   . Hypertension   . Neutropenia, unspecified ([place]) 02/2013   slight, likely ethnicity related  . Pneumococcal vaccine refused 03/2013  . Refused influenza vaccine 03/2013  . S/P hysterectomy   . Sarcoidosis ([place])   . Shortness of breath dyspnea    with exertion   . Supraspinatus tendonitis 2/12   Dr. Gladstone Lighter  . Wears glasses     Past Surgical History:  Procedure Laterality Date  . ABDOMINAL HYSTERECTOMY  1990   and BSO; endometriosis  . ANTERIOR CERVICAL DECOMP/DISCECTOMY FUSION N/A 06/20/2013   Procedure: ANTERIOR CERVICAL DECOMPRESSION/DISCECTOMY FUSION 1 LEVEL;  Surgeon: Ophelia Charter, MD;  Location: [facility];  Service: Neurosurgery;  Laterality: N/A;  C45 anterior cervical decompression with fusion interbody prothesis plating and bonegraft  . BREAST REDUCTION SURGERY  1994  . CARPAL TUNNEL RELEASE Right 06/20/2013   Procedure: CARPAL TUNNEL RELEASE;  Surgeon: Ophelia Charter, MD;  Location: [place] [facility];  Service: Neurosurgery;  Laterality: Right;  RIGHT carpal tunnel release  .  COLONOSCOPY  03/06/12   sigmoid polyp; repeat 10 years; Dr. Tora Duck  . LUNG SURGERY  1987   endometriosis in lungs  . SUPRACLAVICAL NODE BIOPSY Left 01/14/2016   Procedure: LEFT SUPRACLAVICAL NODE BIOPSY;  Surgeon: Melrose Nakayama, MD;  Location: [facility];  Service: Thoracic;  Laterality: Left;    Family History  Problem Relation Age of Onset  . Cancer Paternal Grandmother     breast  . Diabetes Other     maternal and paternal grandmother  . Heart disease Father 20    died of MI, CABG   . Multiple sclerosis Sister   . Asthma Brother   . Cancer Maternal Uncle     hematologic  . Hypertension Maternal Grandmother   . Cancer Maternal Grandfather     prostate  . Hypertension Maternal Grandfather   . Asthma Brother   . Eczema Sister   . Allergies Sister   . Healthy Daughter     Social History:  reports that she has never smoked. She has never used smokeless tobacco. She reports that she drinks alcohol. She reports that she does not use drugs.   Review of Systems   Lipid history: On Pravastatin more recently, just about a week prior to her labs from PCP    Lab Results  Component Value Date   CHOL 250 (H) 09/02/2016   HDL 60.60 09/02/2016   LDLCALC 175 (H) 09/02/2016   TRIG 72.0 09/02/2016   CHOLHDL 4 09/02/2016           Hypertension:Treated since 1997, Blood pressure relatively higher.  She has not reduced her lisinopril HCTZ  as directed with starting Xigduo  BP Readings from Last 3 Encounters:  09/07/16 136/86  08/22/16 114/78  08/17/16 116/74     Most recent eye exam was 3/17, reportedly normal  Most recent foot exam: 8/17  Prednisone given for sarcoidosis  in 3/17 for 6 weeks, again started in early July, dosage 10 mg tid till 8/10  LABS:  Lab on 09/02/2016  Component Date Value Ref Range Status  . Sodium 09/02/2016 139  135 - 145 mEq/L Final  . Potassium 09/02/2016 3.5  3.5 - 5.1 mEq/L Final  . Chloride 09/02/2016 100  96 - 112 mEq/L Final  .  CO2 09/02/2016 32  19 - 32 mEq/L Final  . Glucose, Bld 09/02/2016 140* 70 - 99 mg/dL Final  . BUN 09/02/2016 15  6 - 23 mg/dL Final  . Creatinine, Ser 09/02/2016 0.91  0.40 - 1.20 mg/dL Final  . Total Bilirubin 09/02/2016 1.0  0.2 - 1.2 mg/dL Final  . Alkaline Phosphatase 09/02/2016 295* 39 - 117 U/L Final  . AST 09/02/2016 56* 0 - 37 U/L Final  . ALT 09/02/2016 48* 0 - 35 U/L Final  . Total Protein 09/02/2016 7.6  6.0 - 8.3 g/dL Final  . Albumin 09/02/2016 4.2  3.5 - 5.2 g/dL Final  . Calcium 09/02/2016 9.5  8.4 - 10.5 mg/dL Final  . GFR 09/02/2016 81.79  >60.00 mL/min Final  . Fructosamine 09/03/2016 355* 0 - 285 umol/L Final   Comment: Published reference interval for apparently healthy subjects between age 14 and 14 is 39 - 285 umol/L and in a poorly controlled diabetic population is 228 - 563 umol/L with a mean of 396 umol/L.   Marland Kitchen Cholesterol 09/02/2016 250* 0 - 200 mg/dL Final  . Triglycerides 09/02/2016 72.0  0.0 - 149.0 mg/dL Final  . HDL 09/02/2016 60.60  >39.00 mg/dL Final  . VLDL 09/02/2016 14.4  0.0 - 40.0 mg/dL Final  . LDL Cholesterol 09/02/2016 175* 0 - 99 mg/dL Final  . Total CHOL/HDL Ratio 09/02/2016 4   Final  . NonHDL 09/02/2016 189.44   Final    Physical Examination:  BP 136/86 (BP Location: Left Arm, Cuff Size: Large)   Pulse 83   Ht '5\' 4"'  (1.626 m)   Wt 158 lb (71.7 kg)   LMP 12/27/1987   BMI 27.12 kg/m           ASSESSMENT:  Diabetes type 2, uncontrolled    See history of present illness for detailed discussion of current diabetes management, blood sugar patterns and problems identified She previously had marked hyperglycemia and insulin resistance when she was taking prednisone for her sarcoidosis. Her insulin doses have been adjusted since her last visit on the phone but she has only taken lunchtime premixed insulin without letting us know. Blood sugar monitor not available for download today  She appears to have better blood sugars although  difficult to analyze and she did not bring her monitor. She has not checked readings after meals especially suppertime. She does have a high fructosamine and may still be having relatively high readings at times. However she appears to have fairly good fasting blood sugars with restarting her metformin and Farxiga combination which she is tolerating. She is only taking her insulin at lunch time which is unusual. Discussed that she most likely may have some high readings after evening meal and this may need to be addressed   History of hyperlipidemia: Currently just restarting statin drug   HYPERTENSION: Fairly well-controlled, normal  urine microalbumin The blood pressure relatively higher today but she also has tachycardia, may be stressed  PLAN:    She will check blood sugars after evening meal for the next few days and call if consistently high.  Most likely may need a smaller dose of NovoLog to cover her evening meal.  Discussed blood sugar monitoring at various times and targets, she can cut back on monitoring fasting  She needs to exercise when able to.  Continue to modify diet as discussed with nurse educator especially adding protein at breakfast.  Will need A1c on the next visit  For her lipids most likely she will need a relatively high dose of statin since her LDL is significantly high.  She will also follow-up with her PCP for her blood pressure  Patient Instructions  Check blood sugars on waking up  3x weekly  Also check blood sugars about 2 hours after a meal and do this after different meals by rotation  Recommended blood sugar levels on waking up is 90-130 and about 2 hours after meal is 130-160  Please bring your blood sugar monitor to each visit, thank you  ? Need Novolog at supper  Counseling time on subjects discussed above is over 50% of today's 25 minute visit   Yuniel Blaney 09/07/2016, 9:44 PM   Note: This office note was prepared with [employer]. Any transcriptional errors that result from this process are unintentional.

## 2016-09-09 ENCOUNTER — Telehealth: Payer: Self-pay | Admitting: Pulmonary Disease

## 2016-09-09 ENCOUNTER — Ambulatory Visit (INDEPENDENT_AMBULATORY_CARE_PROVIDER_SITE_OTHER): Payer: BLUE CROSS/BLUE SHIELD | Admitting: Medical

## 2016-09-09 VITALS — BP 120/80 | HR 98 | Wt 156.4 lb

## 2016-09-09 DIAGNOSIS — B379 Candidiasis, unspecified: Secondary | ICD-10-CM | POA: Diagnosis not present

## 2016-09-09 DIAGNOSIS — E1169 Type 2 diabetes mellitus with other specified complication: Secondary | ICD-10-CM | POA: Diagnosis not present

## 2016-09-09 DIAGNOSIS — K7689 Other specified diseases of liver: Secondary | ICD-10-CM | POA: Diagnosis not present

## 2016-09-09 DIAGNOSIS — R748 Abnormal levels of other serum enzymes: Secondary | ICD-10-CM | POA: Diagnosis not present

## 2016-09-09 DIAGNOSIS — R945 Abnormal results of liver function studies: Secondary | ICD-10-CM

## 2016-09-09 DIAGNOSIS — D8689 Sarcoidosis of other sites: Secondary | ICD-10-CM | POA: Diagnosis not present

## 2016-09-09 DIAGNOSIS — E785 Hyperlipidemia, unspecified: Secondary | ICD-10-CM | POA: Diagnosis not present

## 2016-09-09 MED ORDER — PRAVASTATIN SODIUM 40 MG PO TABS: 40.0000 mg | ORAL_TABLET | Freq: Every day | ORAL | 3 refills | Status: DC

## 2016-09-09 MED ORDER — FLUCONAZOLE 150 MG PO TABS: 150.0000 mg | ORAL_TABLET | Freq: Once | ORAL | 0 refills | Status: AC

## 2016-09-09 NOTE — Telephone Encounter (Signed)
CMP Latest Ref Rng & Units 09/02/2016 08/22/2016 06/01/2016  Glucose 70 - 99 mg/dL 140(H) 72 112(H)  BUN 6 - 23 mg/dL 15 17 17   Creatinine 0.40 - 1.20 mg/dL 0.91 0.89 0.72  Sodium 135 - 145 mEq/L 139 140 140  Potassium 3.5 - 5.1 mEq/L 3.5 3.5 3.8  Chloride 96 - 112 mEq/L 100 100 100  CO2 19 - 32 mEq/L 32 28 23  Calcium 8.4 - 10.5 mg/dL 9.5 9.4 9.5  Total Protein 6.0 - 8.3 g/dL 7.6 7.6 7.3  Total Bilirubin 0.2 - 1.2 mg/dL 1.0 1.2 2.2(H)  Alkaline Phos 39 - 117 U/L 295(H) 264(H) 474(H)  AST 0 - 37 U/L 56(H) 80(H) 100(H)  ALT 0 - 35 U/L 48(H) 69(H) 112(H)    CBC Latest Ref Rng & Units 08/22/2016 06/17/2016 06/01/2016  WBC 4.0 - 10.5 K/uL 3.4(L) 3.2(L) 2.7(L)  Hemoglobin 12.0 - 15.0 g/dL 12.9 12.9 13.8  Hematocrit 36.0 - 46.0 % 36.9 39.0 41.5  Platelets 150.0 - 400.0 K/uL 407.0(H) 403(H) 440(H)    CT chest 08/24/16 >> mediastinal/hilar LAN up to 2.2 cm, b/l perilymphatic nodularity, pleural plaques on Lt   Will have my nurse inform pt that CT chest shows expected changes from sarcoidosis.  No change to current therapies.

## 2016-09-09 NOTE — Progress Notes (Signed)
Subjective: Chief Complaint  Patient presents with  . issues    issues with cholesterol.    Here for f/u on cholesterol.  She has hx/ diabetes, hyperlipidemia, hypertension, sarcoidosis of liver and lungs diagnosis 2016, significant ongoing pain since sarcoid diagnosis.  She notes that she saw endocrinology recently and cholesterol was too high.   Dr. Dwyane Dee, endocrinologist wants her back on statin.   Came off statin in the fall when liver tests were elevated.   She saw Dr. Redmond School 2 weeks ago and he restarted the cholesterol medication, Pravachol 20mg  daily which she is taking but hasn't only been back on this a few weeks.   Recent endocrinology notes and recent visit with Dr. Paulita Fujita notes reviewed.  She was started back on Xigduo by endocrinology and thinks she has a yeast infection.  Has raw feeling and red rash in perineum area.  Declines exam today.  No other new c/o.  Past Medical History:  Diagnosis Date  . 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 Allergy   . Biceps tendonitis 2/12   Dr. Gladstone Lighter  . Diabetes mellitus    type II  . History of blood transfusion 1988   hysterectomy  . History of mammogram 05/25/12   stable, no suspicious finding, extremely dense breast tissue  . Hyperlipidemia   . Hypertension   . Neutropenia, unspecified ([place]) 02/2013   slight, likely ethnicity related  . Pneumococcal vaccine refused 03/2013  . Refused influenza vaccine 03/2013  . S/P hysterectomy   . Sarcoidosis ([place])   . Shortness of breath dyspnea    with exertion   . Supraspinatus tendonitis 2/12   Dr. Gladstone Lighter  . Wears glasses    ROS as in subjective   Objective: BP 120/80   Pulse 98   Wt 156 lb 6.4 oz (70.9 kg)   LMP 12/27/1987   BMI 26.85 kg/m   Gen: wd, wn, nad Otherwise not examined Declined pelvic exam    Assessment: Encounter Diagnoses  Name Primary?  . Hyperlipidemia associated with type 2  diabetes mellitus ([place]) Yes  . Elevated liver enzymes   . Sarcoidosis of other sites [facility])   . Abnormal liver function   . Yeast infection     Plan: She will use Pravachol 40mg  but cut in half to 20mg  daily for now.   Of note , she just started back on Pravachol 08/17/16, and labs were done 09/02/16.  So I think she needs more time on the statin to get back to goal.   Comparing 2016 numbers, she was close to goal a year ago on Pravachol 20mg .   She was encourage to eat low cholesterol diet, get routine exercise.    We will plan to recheck cholesterol in 64mo.   Yeast infection - begin diflucan  Bethany Webb was seen today for issues.  Diagnoses and all orders for this visit:  Hyperlipidemia associated with type 2 diabetes mellitus ([place])  Elevated liver enzymes  Sarcoidosis of other sites ([place])  Abnormal liver function  Yeast infection  Other orders -     pravastatin (PRAVACHOL) 40 MG tablet; Take 1 tablet (40 mg total) by mouth daily. -     fluconazole (DIFLUCAN) 150 MG tablet; Take 1 tablet (150 mg total) by mouth once.

## 2016-09-12 ENCOUNTER — Encounter (HOSPITAL_COMMUNITY): Payer: Self-pay | Admitting: Emergency Medicine

## 2016-09-12 ENCOUNTER — Emergency Department (HOSPITAL_COMMUNITY)
Admission: EM | Admit: 2016-09-12 | Discharge: 2016-09-12 | Disposition: A | Discharge status: Home / Self Care | Payer: BLUE CROSS/BLUE SHIELD | Attending: Emergency Medicine | Admitting: Emergency Medicine

## 2016-09-12 DIAGNOSIS — Z794 Long term (current) use of insulin: Secondary | ICD-10-CM | POA: Insufficient documentation

## 2016-09-12 DIAGNOSIS — R1011 Right upper quadrant pain: Secondary | ICD-10-CM

## 2016-09-12 DIAGNOSIS — Z79899 Other long term (current) drug therapy: Secondary | ICD-10-CM | POA: Diagnosis not present

## 2016-09-12 DIAGNOSIS — I10 Essential (primary) hypertension: Secondary | ICD-10-CM | POA: Insufficient documentation

## 2016-09-12 DIAGNOSIS — E119 Type 2 diabetes mellitus without complications: Secondary | ICD-10-CM | POA: Diagnosis not present

## 2016-09-12 LAB — CBC
HCT: 43 % (ref 36.0–46.0)
Hemoglobin: 14 g/dL (ref 12.0–15.0)
MCH: 29 pg (ref 26.0–34.0)
MCHC: 32.6 g/dL (ref 30.0–36.0)
MCV: 89 fL (ref 78.0–100.0)
PLATELETS: 384 10*3/uL (ref 150–400)
RBC: 4.83 MIL/uL (ref 3.87–5.11)
RDW: 13.6 % (ref 11.5–15.5)
WBC: 4.4 10*3/uL (ref 4.0–10.5)

## 2016-09-12 LAB — URINALYSIS, ROUTINE W REFLEX MICROSCOPIC
Hgb urine dipstick: NEGATIVE
KETONES UR: NEGATIVE mg/dL
LEUKOCYTES UA: NEGATIVE
NITRITE: NEGATIVE
PROTEIN: NEGATIVE mg/dL
Specific Gravity, Urine: 1.038 — ABNORMAL HIGH (ref 1.005–1.030)
pH: 6 (ref 5.0–8.0)

## 2016-09-12 LAB — COMPREHENSIVE METABOLIC PANEL
ALT: 47 U/L (ref 14–54)
AST: 63 U/L — ABNORMAL HIGH (ref 15–41)
Albumin: 3.9 g/dL (ref 3.5–5.0)
Alkaline Phosphatase: 314 U/L — ABNORMAL HIGH (ref 38–126)
Anion gap: 10 (ref 5–15)
BUN: 13 mg/dL (ref 6–20)
CALCIUM: 9.9 mg/dL (ref 8.9–10.3)
CO2: 27 mmol/L (ref 22–32)
CREATININE: 0.97 mg/dL (ref 0.44–1.00)
Chloride: 100 mmol/L — ABNORMAL LOW (ref 101–111)
Glucose, Bld: 193 mg/dL — ABNORMAL HIGH (ref 65–99)
Potassium: 4 mmol/L (ref 3.5–5.1)
SODIUM: 137 mmol/L (ref 135–145)
TOTAL PROTEIN: 7.8 g/dL (ref 6.5–8.1)
Total Bilirubin: 1.2 mg/dL (ref 0.3–1.2)

## 2016-09-12 LAB — URINE MICROSCOPIC-ADD ON: RBC / HPF: NONE SEEN RBC/hpf (ref 0–5)

## 2016-09-12 LAB — LIPASE, BLOOD: LIPASE: 89 U/L — AB (ref 11–51)

## 2016-09-12 MED ORDER — FENTANYL CITRATE (PF) 100 MCG/2ML IJ SOLN
100.0000 ug | Freq: Once | INTRAMUSCULAR | Status: DC
  Filled 2016-09-12: qty 2

## 2016-09-12 MED ORDER — SODIUM CHLORIDE 0.9 % IV BOLUS (SEPSIS): 1000.0000 mL | Freq: Once | INTRAVENOUS | Status: DC

## 2016-09-12 MED ORDER — FENTANYL CITRATE (PF) 100 MCG/2ML IJ SOLN
100.0000 ug | Freq: Once | INTRAMUSCULAR | Status: AC
  Administered 2016-09-12: 100 ug via INTRAMUSCULAR

## 2016-09-12 MED ORDER — OXYCODONE HCL 5 MG PO TABS: 5.0000 mg | ORAL_TABLET | ORAL | 0 refills | Status: DC | PRN

## 2016-09-12 MED ORDER — OXYCODONE HCL 5 MG PO TABS
10.0000 mg | ORAL_TABLET | Freq: Once | ORAL | Status: AC
  Administered 2016-09-12: 10 mg via ORAL
  Filled 2016-09-12: qty 2

## 2016-09-12 MED ORDER — OXYCODONE-ACETAMINOPHEN 5-325 MG PO TABS: 2.0000 | ORAL_TABLET | Freq: Once | ORAL | Status: DC

## 2016-09-12 MED ORDER — PREDNISONE 10 MG PO TABS: 10.0000 mg | ORAL_TABLET | Freq: Three times a day (TID) | ORAL | 0 refills | Status: DC

## 2016-09-12 NOTE — ED Notes (Signed)
This RN tried unsuccessful for an IV, L wrist.  Pt requests no more sticks, requests pain meds PO or IM.  Dr. Dayna Barker made aware.

## 2016-09-12 NOTE — Telephone Encounter (Signed)
lmtcb X1 for pt to relay results/recs.  

## 2016-09-12 NOTE — ED Triage Notes (Signed)
Pt has RUQ pain -- recently dx with Sarcoidosis of liver. Lung. Eyes, skin-- started having sever right upper quad pain 2 days ago- no vomiting,

## 2016-09-12 NOTE — ED Notes (Signed)
Patient verbalized understanding of discharge instructions.  Patient ambulatory to the waiting room and will have family drive her home.

## 2016-09-12 NOTE — ED Provider Notes (Signed)
[facility] DEPT Provider Note   CSN: [id number] Arrival date & time: 09/12/16  8270     History   Chief Complaint Chief Complaint  Patient presents with  . Abdominal Pain    HPI Bethany Webb is a 55 y.o. female.   Abdominal Pain   This is a new problem. The current episode started 2 days ago. The problem occurs constantly. The pain is located in the RUQ and epigastric region. The pain is at a severity of 4/10. The pain is moderate. Associated symptoms include nausea. Pertinent negatives include vomiting. The symptoms are aggravated by certain positions. Nothing relieves the symptoms. Past workup includes GI consult and ultrasound.    Past Medical History:  Diagnosis Date  . 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 Allergy   . Biceps tendonitis 2/12   Dr. Gladstone Lighter  . Diabetes mellitus    type II  . History of blood transfusion 1988   hysterectomy  . History of mammogram 05/25/12   stable, no suspicious finding, extremely dense breast tissue  . Hyperlipidemia   . Hypertension   . Neutropenia, unspecified ([place]) 02/2013   slight, likely ethnicity related  . Pneumococcal vaccine refused 03/2013  . Refused influenza vaccine 03/2013  . S/P hysterectomy   . Sarcoidosis ([place])   . Shortness of breath dyspnea    with exertion   . Supraspinatus tendonitis 2/12   Dr. Gladstone Lighter  . Wears glasses     Patient Active Problem List   Diagnosis Date Noted  . 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 Sarcoidosis of other sites ([place]) 01/21/2016  . 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 Thyroiditis 01/21/2016  . Abnormal liver function 01/21/2016  . Elevated liver enzymes 01/05/2016  . Abnormal chest CT 01/05/2016  . Lymphadenopathy, mediastinal 01/05/2016  . Pleural plaque 01/05/2016  . Pulmonary nodule 01/05/2016  . Anorexia 01/05/2016  . Loss of  weight 01/05/2016  . Night sweats 01/05/2016  . Allergic conjunctivitis 10/18/2011  . Allergic rhinitis 10/18/2011  . Diabetes mellitus with complication ([place]) 78/67/5449  . Hyperlipidemia associated with type 2 diabetes mellitus ([place]) 10/06/2011  . Essential hypertension, benign 10/06/2011    Past Surgical History:  Procedure Laterality Date  . ABDOMINAL HYSTERECTOMY  1990   and BSO; endometriosis  . ANTERIOR CERVICAL DECOMP/DISCECTOMY FUSION N/A 06/20/2013   Procedure: ANTERIOR CERVICAL DECOMPRESSION/DISCECTOMY FUSION 1 LEVEL;  Surgeon: Ophelia Charter, MD;  Location: [facility];  Service: Neurosurgery;  Laterality: N/A;  C45 anterior cervical decompression with fusion interbody prothesis plating and bonegraft  . BREAST REDUCTION SURGERY  1994  . CARPAL TUNNEL RELEASE Right 06/20/2013   Procedure: CARPAL TUNNEL RELEASE;  Surgeon: Ophelia Charter, MD;  Location: [facility];  Service: Neurosurgery;  Laterality: Right;  RIGHT carpal tunnel release  . COLONOSCOPY  03/06/12   sigmoid polyp; repeat 10 years; Dr. Tora Duck  . LUNG SURGERY  1987   endometriosis in lungs  . SUPRACLAVICAL NODE BIOPSY Left 01/14/2016   Procedure: LEFT SUPRACLAVICAL NODE BIOPSY;  Surgeon: Melrose Nakayama, MD;  Location: [facility] OR;  Service: Thoracic;  Laterality: Left;    OB History    Gravida Para Term Preterm AB Living   '2 1       1   ' SAB TAB Ectopic Multiple Live Births  Home Medications    Prior to Admission medications   Medication Sig Start Date End Date Taking? Authorizing Provider  amLODipine (NORVASC) 10 MG tablet Take 1 tablet (10 mg total) by mouth daily. 12/09/15  Yes Camelia Eng Tysinger, PA-C  azaTHIOprine (IMURAN) 50 MG tablet Take 50 mg by mouth daily.   Yes Historical Provider, MD  blood glucose meter kit and supplies KIT Dispense based on patient and insurance preference. Use up to four times daily as directed. (FOR ICD-9 250.00, 250.01). 03/08/16  Yes Camelia Eng  Tysinger, PA-C  Dapagliflozin-Metformin HCl ER (XIGDUO XR) 09-999 MG TB24 Take 1 tablet by mouth daily. One a day    Yes Historical Provider, MD  glucose blood test strip Test blood sugar twice daily. Pt uses one touch ultra 07/21/16  Yes Camelia Eng Tysinger, PA-C  ibuprofen (ADVIL,MOTRIN) 800 MG tablet TAKE 1 TABLET(800 MG) BY MOUTH EVERY 8 HOURS AS NEEDED 04/27/16  Yes Camelia Eng Tysinger, PA-C  insulin aspart protamine - aspart (NOVOLOG MIX 70/30 FLEXPEN) (70-30) 100 UNIT/ML FlexPen Inject 0.5 mLs (50 Units total) into the skin 3 (three) times daily. INJECT 20 UNITS INTO THE SKIN TWICE DAILY WITH A MEAL Patient taking differently: Inject 25 Units into the skin daily with lunch.  07/21/16  Yes Camelia Eng Tysinger, PA-C  Insulin Pen Needle (PEN NEEDLES) 32G X 4 MM MISC 1 each by Does not apply route daily. 06/27/16  Yes Camelia Eng Tysinger, PA-C  Lancets 30G MISC Test blood sugars twice daily 09/14/15  Yes Camelia Eng Tysinger, PA-C  lisinopril-hydrochlorothiazide (PRINZIDE,ZESTORETIC) 20-25 MG tablet Take 1 tablet by mouth daily. 12/09/15  Yes Camelia Eng Tysinger, PA-C  pravastatin (PRAVACHOL) 20 MG tablet Take 1 tablet (20 mg total) by mouth daily. 08/17/16  Yes Denita Lung, MD  predniSONE (DELTASONE) 10 MG tablet Take 10 mg by mouth 3 (three) times daily. 6 weeks on and 6 weeks off   Yes Historical Provider, MD  oxyCODONE (ROXICODONE) 5 MG immediate release tablet Take 1 tablet (5 mg total) by mouth every 4 (four) hours as needed for severe pain. 09/12/16   Merrily Pew, MD  predniSONE (DELTASONE) 10 MG tablet Take 1 tablet (10 mg total) by mouth 3 (three) times daily. 09/12/16   Merrily Pew, MD    Family History Family History  Problem Relation Age of Onset  . Heart disease Father 80    died of MI, CABG   . Multiple sclerosis Sister   . Asthma Brother   . Cancer Paternal Grandmother     breast  . Diabetes Other     maternal and paternal grandmother  . Cancer Maternal Uncle     hematologic  . Hypertension  Maternal Grandmother   . Cancer Maternal Grandfather     prostate  . Hypertension Maternal Grandfather   . Asthma Brother   . Eczema Sister   . Allergies Sister   . Healthy Daughter     Social History Social History  Substance Use Topics  . Smoking status: Never Smoker  . Smokeless tobacco: Never Used  . Alcohol use 0.0 oz/week     Comment: rare wine, maybe 2-3 times years     Allergies   Codeine and Metformin and related   Review of Systems Review of Systems  Gastrointestinal: Positive for abdominal pain and nausea. Negative for vomiting.  All other systems reviewed and are negative.    Physical Exam Updated Vital Signs BP 134/91   Pulse 98   Temp 98.5  F (36.9 C) (Oral)   Resp 18   Ht '5\' 4"'  (1.626 m)   Wt 160 lb (72.6 kg)   LMP 12/27/1987   SpO2 99%   BMI 27.46 kg/m   Physical Exam  Constitutional: She appears well-developed and well-nourished. No distress.  HENT:  Head: Normocephalic and atraumatic.  Eyes: Conjunctivae are normal.  Neck: Neck supple.  Cardiovascular: Normal rate and regular rhythm.   No murmur heard. Pulmonary/Chest: Effort normal and breath sounds normal. No respiratory distress.  Abdominal: Soft. There is no tenderness.  Musculoskeletal: She exhibits no edema.  Neurological: She is alert.  Skin: Skin is warm and dry.  Psychiatric: She has a normal mood and affect.  Nursing note and vitals reviewed.    ED Treatments / Results  Labs (all labs ordered are listed, but only abnormal results are displayed) Labs Reviewed  LIPASE, BLOOD - Abnormal; Notable for the following:       Result Value   Lipase 89 (*)    All other components within normal limits  COMPREHENSIVE METABOLIC PANEL - Abnormal; Notable for the following:    Chloride 100 (*)    Glucose, Bld 193 (*)    AST 63 (*)    Alkaline Phosphatase 314 (*)    All other components within normal limits  URINALYSIS, ROUTINE W REFLEX MICROSCOPIC (NOT AT [facility]) - Abnormal;  Notable for the following:    Specific Gravity, Urine 1.038 (*)    Glucose, UA >1000 (*)    Bilirubin Urine SMALL (*)    All other components within normal limits  URINE MICROSCOPIC-ADD ON - Abnormal; Notable for the following:    Squamous Epithelial / LPF 0-5 (*)    Bacteria, UA RARE (*)    All other components within normal limits  CBC    EKG  EKG Interpretation None       Radiology No results found.  Procedures Procedures (including critical care time)  Medications Ordered in ED Medications  fentaNYL (SUBLIMAZE) injection 100 mcg (100 mcg Intramuscular Given 09/12/16 1302)  oxyCODONE (Oxy IR/ROXICODONE) immediate release tablet 10 mg (10 mg Oral Given 09/12/16 1609)     Initial Impression / Assessment and Plan / ED Course  I have reviewed the triage vital signs and the nursing notes.  Pertinent labs & imaging results that were available during my care of the patient were reviewed by me and considered in my medical decision making (see chart for details).  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 Discussed with GI, will not need re-imaging at this time. Will restart prednisone, pain meds and Gi follow up as soon as she can get it. Pain improved in ED, no vomiting. Stable for dc.   Final Clinical Impressions(s) / ED Diagnoses   Final diagnoses:  Right upper quadrant pain    New Prescriptions Discharge Medication List as of 09/12/2016  4:29 PM    START taking these medications   Details  oxyCODONE (ROXICODONE) 5 MG immediate release tablet Take 1 tablet (5 mg total) by mouth every 4 (four) hours as needed for severe pain., Starting Mon 09/12/2016, Print    !! predniSONE (DELTASONE) 10 MG tablet Take 1 tablet (10 mg total) by mouth 3 (three) times daily., Starting Mon 09/12/2016, Print     !! - Potential duplicate medications found. Please discuss with provider.       Merrily Pew, MD 09/12/16 [phone]

## 2016-09-12 NOTE — ED Notes (Signed)
Gave pt water per Chrislyn-RN.

## 2016-09-12 NOTE — ED Notes (Signed)
MD at bedside. 

## 2016-09-13 NOTE — Telephone Encounter (Signed)
Spoke with pt. She is aware of results. Nothing further was needed.  

## 2016-09-15 ENCOUNTER — Telehealth: Payer: Self-pay | Admitting: Medical

## 2016-09-15 ENCOUNTER — Ambulatory Visit (INDEPENDENT_AMBULATORY_CARE_PROVIDER_SITE_OTHER): Payer: BLUE CROSS/BLUE SHIELD | Admitting: Medical

## 2016-09-15 ENCOUNTER — Other Ambulatory Visit: Payer: Self-pay

## 2016-09-15 ENCOUNTER — Encounter: Payer: Self-pay | Admitting: Medical

## 2016-09-15 VITALS — BP 140/80 | HR 100 | Temp 98.4°F | Resp 16 | Wt 155.4 lb

## 2016-09-15 DIAGNOSIS — R1011 Right upper quadrant pain: Secondary | ICD-10-CM | POA: Diagnosis not present

## 2016-09-15 DIAGNOSIS — E069 Thyroiditis, unspecified: Secondary | ICD-10-CM | POA: Diagnosis not present

## 2016-09-15 DIAGNOSIS — D869 Sarcoidosis, unspecified: Secondary | ICD-10-CM | POA: Diagnosis not present

## 2016-09-15 DIAGNOSIS — I10 Essential (primary) hypertension: Secondary | ICD-10-CM

## 2016-09-15 DIAGNOSIS — R1013 Epigastric pain: Secondary | ICD-10-CM | POA: Diagnosis not present

## 2016-09-15 DIAGNOSIS — E118 Type 2 diabetes mellitus with unspecified complications: Secondary | ICD-10-CM | POA: Diagnosis not present

## 2016-09-15 DIAGNOSIS — R110 Nausea: Secondary | ICD-10-CM

## 2016-09-15 DIAGNOSIS — R112 Nausea with vomiting, unspecified: Secondary | ICD-10-CM | POA: Insufficient documentation

## 2016-09-15 MED ORDER — OMEPRAZOLE 40 MG PO CPDR: 40.0000 mg | DELAYED_RELEASE_CAPSULE | Freq: Every day | ORAL | 3 refills | Status: DC

## 2016-09-15 MED ORDER — ONDANSETRON HCL 4 MG PO TABS: 4.0000 mg | ORAL_TABLET | Freq: Three times a day (TID) | ORAL | 0 refills | Status: DC | PRN

## 2016-09-15 NOTE — Progress Notes (Signed)
Subjective: Chief Complaint  Patient presents with  . Hospitalization Follow-up    E.D. f/u, RUQ pain still present- described as "sore" as this time.   Here for ED f/u.  Went to emergency dept 09/12/16 for RUQ pain, and after eval was thought to be related to Sarcoidosis diagnosis.  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   She went to the ED with 4/10 pain, RUQ and epigastric, nausea.  No vomiting,no fever, no diarrhea, no constipation, no urinary c/o.   Taking the pain medication, 1/2 tablet once daily but it makes her sleepy.  Not taking anything for nausea.  Her GI specialist is out of the country until November.  They advised 6 weeks off, 6 weeks on.  She really doesn't like the idea of being on 6 shots daily of insulin when she takes the prednisone.    Dr. Paulita Fujita is her primary GI specialist.    colonoscopy 2013 Dr. Glennon Hamilton.  No prior EGD.   Past Medical History:  Diagnosis Date  . 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 Allergy   . Biceps tendonitis 2/12   Dr. Gladstone Lighter  . Diabetes mellitus    type II  . History of blood transfusion 1988   hysterectomy  . History of mammogram 05/25/12   stable, no suspicious finding, extremely dense breast tissue  . Hyperlipidemia   . Hypertension   . Neutropenia, unspecified ([place]) 02/2013   slight, likely ethnicity related  . Pneumococcal vaccine refused 03/2013  . Refused influenza vaccine 03/2013  . S/P hysterectomy   . Sarcoidosis ([place])   . Shortness of breath dyspnea    with exertion   . Supraspinatus tendonitis 2/12   Dr. Gladstone Lighter  . Wears glasses    Current Outpatient Prescriptions on File Prior to Visit  Medication Sig Dispense Refill  . amLODipine (NORVASC) 10 MG tablet Take 1 tablet (10 mg total) by mouth daily. 90 tablet 3  . azaTHIOprine (IMURAN) 50 MG tablet Take 50 mg by mouth daily.    . blood glucose meter kit and supplies  KIT Dispense based on patient and insurance preference. Use up to four times daily as directed. (FOR ICD-9 250.00, 250.01). 1 each 0  . Dapagliflozin-Metformin HCl ER (XIGDUO XR) 09-999 MG TB24 Take 1 tablet by mouth daily. One a day     . glucose blood test strip Test blood sugar twice daily. Pt uses one touch ultra 100 each 11  . insulin aspart protamine - aspart (NOVOLOG MIX 70/30 FLEXPEN) (70-30) 100 UNIT/ML FlexPen Inject 0.5 mLs (50 Units total) into the skin 3 (three) times daily. INJECT 20 UNITS INTO THE SKIN TWICE DAILY WITH A MEAL (Patient taking differently: Inject 25 Units into the skin daily with lunch. ) 100 mL 3  . lisinopril-hydrochlorothiazide (PRINZIDE,ZESTORETIC) 20-25 MG tablet Take 1 tablet by mouth daily. 90 tablet 3  . oxyCODONE (ROXICODONE) 5 MG immediate release tablet Take 1 tablet (5 mg total) by mouth every 4 (four) hours as needed for severe pain. 30 tablet 0  . pravastatin (PRAVACHOL) 20 MG tablet Take 1 tablet (20 mg total) by mouth daily. 90 tablet 3  . ibuprofen (ADVIL,MOTRIN) 800 MG tablet TAKE 1 TABLET(800 MG) BY MOUTH EVERY 8 HOURS AS NEEDED (Patient not taking: Reported on 09/15/2016) 30 tablet 0  . Insulin Pen Needle (PEN NEEDLES) 32G X 4 MM MISC 1 each by  Does not apply route daily. 200 each 1  . Lancets 30G MISC Test blood sugars twice daily 100 each 2  . predniSONE (DELTASONE) 10 MG tablet Take 10 mg by mouth 3 (three) times daily. 6 weeks on and 6 weeks off    . predniSONE (DELTASONE) 10 MG tablet Take 1 tablet (10 mg total) by mouth 3 (three) times daily. (Patient not taking: Reported on 09/15/2016) 42 tablet 0   No current facility-administered medications on file prior to visit.    Past Surgical History:  Procedure Laterality Date  . ABDOMINAL HYSTERECTOMY  1990   and BSO; endometriosis  . ANTERIOR CERVICAL DECOMP/DISCECTOMY FUSION N/A 06/20/2013   Procedure: ANTERIOR CERVICAL DECOMPRESSION/DISCECTOMY FUSION 1 LEVEL;  Surgeon: Ophelia Charter, MD;   Location: [place] [facility];  Service: Neurosurgery;  Laterality: N/A;  C45 anterior cervical decompression with fusion interbody prothesis plating and bonegraft  . BREAST REDUCTION SURGERY  1994  . CARPAL TUNNEL RELEASE Right 06/20/2013   Procedure: CARPAL TUNNEL RELEASE;  Surgeon: Ophelia Charter, MD;  Location: [place] [facility];  Service: Neurosurgery;  Laterality: Right;  RIGHT carpal tunnel release  . COLONOSCOPY  03/06/12   sigmoid polyp; repeat 10 years; Dr. Tora Duck  . LUNG SURGERY  1987   endometriosis in lungs  . SUPRACLAVICAL NODE BIOPSY Left 01/14/2016   Procedure: LEFT SUPRACLAVICAL NODE BIOPSY;  Surgeon: Melrose Nakayama, MD;  Location: [facility];  Service: Thoracic;  Laterality: Left;   ROS as in subjective   Objective: BP 140/80   Pulse 100   Temp 98.4 F (36.9 C) (Oral)   Resp 16   Wt 155 lb 6.4 oz (70.5 kg)   LMP 12/27/1987   BMI 26.67 kg/m   Wt Readings from Last 3 Encounters:  09/15/16 155 lb 6.4 oz (70.5 kg)  09/12/16 160 lb (72.6 kg)  09/09/16 156 lb 6.4 oz (70.9 kg)   General appearance: alert,in pain, WD/WN, AA female Oral cavity: MMM, no lesions Neck: supple, no lymphadenopathy, no thyromegaly, no masses Heart: RRR, normal S1, S2, no murmurs Lungs: CTA bilaterally, no wheezes, rhonchi, or rales Abdomen: +bs, soft, RUQ and epigastric tenderness, otherwise non tender, non distended, no masses, no hepatomegaly, no splenomegaly Back: nontender Ext: no edema Pulses: 2+ symmetric, upper and lower extremities, normal cap refill    Assessment: Encounter Diagnoses  Name Primary?  . RUQ pain Yes  . Abdominal pain, epigastric   . Nausea   . Sarcoidosis ([place])   . Essential hypertension, benign   . Thyroiditis   . Diabetes mellitus with complication [facility])       Plan: Reviewed 09/12/16 ED visit notes.   Was given Oxycodone to help with pain, prednisone at the ED.  I called and spoke to Dr. Amedeo Plenty at [facility] since Dr. Paulita Fujita is out of the country.   They  will see her soon in f/u, will likely get EGD as she has never had this.  after our discussion, we will start her on PPI, will get HIDA scan to rule out gall bladder, will use zofran for nausea.  She will use Oxycodone for pain given by the ED.  I asked Delene to call back before she finishes Oxycodone to decide on trying Hydrocodone or Ultram instead at that point.  We can coordinate pain control and time off work with GI. She may need to take some time to recover.  Gave work note today out through next Tuesday in 5 days.    She  can work with GI to decide on prednisone.  She is reluctant to use this given poor glycemic control and long term risks, but she will c/t imuran for now.    Mitra was seen today for hospitalization follow-up.  Diagnoses and all orders for this visit:  RUQ pain -     Cancel: NM Hepato W/Eject Fract; Future -     NM Hepato W/Eject Fract; Future  Abdominal pain, epigastric -     Cancel: NM Hepato W/Eject Fract; Future -     NM Hepato W/Eject Fract; Future  Nausea -     NM Hepato W/Eject Fract; Future  Sarcoidosis ([place])  Essential hypertension, benign  Thyroiditis  Diabetes mellitus with complication ([place])  Other orders -     omeprazole (PRILOSEC) 40 MG capsule; Take 1 capsule (40 mg total) by mouth daily. -     ondansetron (ZOFRAN) 4 MG tablet; Take 1 tablet (4 mg total) by mouth every 8 (eight) hours as needed for nausea or vomiting.  Spent > 30 minutes face to face with patient in discussion of symptoms, evaluation, plan and recommendations.

## 2016-09-15 NOTE — Telephone Encounter (Signed)
Per Herbie Baltimore at [facility]) no- pre-cert required for HIDA scan @ [phone].      LM for Earma Reading to [facility] to schedule HIDA. Awaiting CB.

## 2016-09-15 NOTE — Telephone Encounter (Signed)
Set up HIDA scan

## 2016-09-20 NOTE — Telephone Encounter (Signed)
LM for pt to CB.   Appt Oct 12th @ [facility]

## 2016-09-20 NOTE — Telephone Encounter (Signed)
Mailed appt info to pt

## 2016-09-21 ENCOUNTER — Telehealth: Payer: Self-pay | Admitting: Family Medicine

## 2016-09-21 DIAGNOSIS — D862 Sarcoidosis of lung with sarcoidosis of lymph nodes: Secondary | ICD-10-CM

## 2016-09-21 NOTE — Telephone Encounter (Signed)
Pt called and wants a referral to Dr. Chrystie Nose at the [facility].  He is a Sarcoidosis specialist.  Pt ph [phone]  Please advise

## 2016-09-21 NOTE — Telephone Encounter (Signed)
If she has someone such as this she wants to see for other opinion, that is fine.  Please refer

## 2016-09-23 NOTE — Telephone Encounter (Signed)
Called patient to inform faxed info to Dr. Chrystie Nose office @ [phone]. Provided fax# for patient to have Dr. Lavetta Nielsen info sent also per patient request.

## 2016-10-06 ENCOUNTER — Ambulatory Visit (HOSPITAL_COMMUNITY): Payer: BLUE CROSS/BLUE SHIELD

## 2016-10-14 ENCOUNTER — Other Ambulatory Visit (INDEPENDENT_AMBULATORY_CARE_PROVIDER_SITE_OTHER): Payer: BLUE CROSS/BLUE SHIELD

## 2016-10-14 DIAGNOSIS — E1165 Type 2 diabetes mellitus with hyperglycemia: Secondary | ICD-10-CM | POA: Diagnosis not present

## 2016-10-14 DIAGNOSIS — Z794 Long term (current) use of insulin: Secondary | ICD-10-CM | POA: Diagnosis not present

## 2016-10-14 LAB — COMPREHENSIVE METABOLIC PANEL
ALK PHOS: 237 U/L — AB (ref 39–117)
ALT: 36 U/L — AB (ref 0–35)
AST: 41 U/L — AB (ref 0–37)
Albumin: 4.5 g/dL (ref 3.5–5.2)
BILIRUBIN TOTAL: 0.9 mg/dL (ref 0.2–1.2)
BUN: 19 mg/dL (ref 6–23)
CO2: 30 meq/L (ref 19–32)
CREATININE: 0.88 mg/dL (ref 0.40–1.20)
Calcium: 10.2 mg/dL (ref 8.4–10.5)
Chloride: 100 mEq/L (ref 96–112)
GFR: 84.98 mL/min (ref >60.00)
GLUCOSE: 168 mg/dL — AB (ref 70–99)
Potassium: 3.8 mEq/L (ref 3.5–5.1)
SODIUM: 139 meq/L (ref 135–145)
TOTAL PROTEIN: 8.1 g/dL (ref 6.0–8.3)

## 2016-10-14 LAB — HEMOGLOBIN A1C: Hgb A1c MFr Bld: 7.1 % — ABNORMAL HIGH (ref 4.6–6.5)

## 2016-10-18 ENCOUNTER — Telehealth: Payer: Self-pay | Admitting: Medical

## 2016-10-18 NOTE — Telephone Encounter (Signed)
That is fine 

## 2016-10-18 NOTE — Telephone Encounter (Signed)
Left message for pt to call and schedule lab appt

## 2016-10-18 NOTE — Telephone Encounter (Signed)
Pt states Pulmanary Dr. Chrystie Nose gave her order for labs, CBC with diff, CMET, Hep B, Para thyroid, Vit D & TB test And she wants to know if she can have them drawn here?

## 2016-10-19 ENCOUNTER — Ambulatory Visit (INDEPENDENT_AMBULATORY_CARE_PROVIDER_SITE_OTHER): Payer: BLUE CROSS/BLUE SHIELD | Admitting: Endocrinology

## 2016-10-19 ENCOUNTER — Other Ambulatory Visit: Payer: Self-pay

## 2016-10-19 ENCOUNTER — Other Ambulatory Visit: Payer: BLUE CROSS/BLUE SHIELD

## 2016-10-19 ENCOUNTER — Encounter: Payer: Self-pay | Admitting: Endocrinology

## 2016-10-19 VITALS — BP 112/68 | HR 108 | Ht 64.0 in | Wt 148.0 lb

## 2016-10-19 DIAGNOSIS — E1165 Type 2 diabetes mellitus with hyperglycemia: Secondary | ICD-10-CM | POA: Diagnosis not present

## 2016-10-19 DIAGNOSIS — Z794 Long term (current) use of insulin: Secondary | ICD-10-CM

## 2016-10-19 DIAGNOSIS — Z23 Encounter for immunization: Secondary | ICD-10-CM | POA: Diagnosis not present

## 2016-10-19 DIAGNOSIS — I10 Essential (primary) hypertension: Secondary | ICD-10-CM

## 2016-10-19 NOTE — Progress Notes (Signed)
Patient ID: Bethany Webb, female   DOB: 05/14/1961, 55 y.o.   MRN: [id number]           Reason for Appointment: Follow-up for Type 2 Diabetes  Referring physician: Tysinger   History of Present Illness:          Date of diagnosis of type 2 diabetes mellitus:        Background history:   She has been on various medications over the last few years and followed sporadically for her diabetes She was briefly taking metformin but larger doses she was having GI side effects Was on Xigduo last year but this was stopped when liver functions were normal in 1/16 and insulin started  Her [name] was stopped in December  Recent history:   INSULIN regimen is:  Novolog Mix: 25 at lunch     Non-insulin hypoglycemic drugs the patient is taking are: Xigduo 09/999 daily  Most recent A1c is 7.1  Current management, blood sugar patterns and problems identified:  Although her blood sugars had improved on her last visit they seem to be higher now  She was advised to check her sugars after evening meal on her last visit but she has done.only occasionally  Has only 6 readings in the last 2 weeks on her meter, she uses a different one at home  Even without prednisone she seems to have high readings during the day and probably has a good readings only in the morning, however this still higher than on her last visit  She is compliant with Xigduo and her weight is slightly better; renal functions are stable.  She is generally trying to watch her diet  Has been able to do a little more walking recently  She did not understand what blood sugar targets should be  Again taking insulin only at lunchtime, no hypoglycemia with taking relatively large doses midday       Side effects from medications have been: Diarrhea from regular metformin  Compliance with the medical regimen: Fair Hypoglycemia:   never  Glucose monitoring:  done 3 times a day         Glucometer:  contour       Blood Glucose  readings by time of day from review of meter for the last 2 weeks:  Mean values apply above for all meters except median for One Touch  PRE-MEAL Fasting Lunch Dinner Bedtime Overall  Glucose range: 141 201 237 185   Mean/median:     175   Late-night: 205  Self-care: The diet that the patient has been following is: tries to limit bread, sugar, fried food.     Meal times are:  Breakfast is Late morning, Dinner: 7-9 pm  Typical meal intake: Breakfast is Cereal/oatmeal.  Lunch will be chicken salad.  Has low fat meal at dinner.  Snacks: Nuts, walnuts, peanut butter  No regular soft drinks               Dietician visit, most recent:none               CDE: 9/17 Exercise: walking upto 30 min, chair exercises and weights   Weight history:  Wt Readings from Last 3 Encounters:  10/19/16 148 lb (67.1 kg)  09/15/16 155 lb 6.4 oz (70.5 kg)  09/12/16 160 lb (72.6 kg)    Glycemic control:   Lab Results  Component Value Date   HGBA1C 7.1 (H) 10/14/2016   HGBA1C 9.7 08/11/2016   HGBA1C 7.6 (H)  05/25/2016   Lab Results  Component Value Date   MICROALBUR <0.7 08/11/2016   LDLCALC 175 (H) 09/02/2016   CREATININE 0.88 10/14/2016   Lab Results  Component Value Date   MICRALBCREAT 1.4 08/11/2016   Lab on 10/14/2016  Component Date Value Ref Range Status  . Sodium 10/14/2016 139  135 - 145 mEq/L Final  . Potassium 10/14/2016 3.8  3.5 - 5.1 mEq/L Final  . Chloride 10/14/2016 100  96 - 112 mEq/L Final  . CO2 10/14/2016 30  19 - 32 mEq/L Final  . Glucose, Bld 10/14/2016 168* 70 - 99 mg/dL Final  . BUN 10/14/2016 19  6 - 23 mg/dL Final  . Creatinine, Ser 10/14/2016 0.88  0.40 - 1.20 mg/dL Final  . Total Bilirubin 10/14/2016 0.9  0.2 - 1.2 mg/dL Final  . Alkaline Phosphatase 10/14/2016 237* 39 - 117 U/L Final  . AST 10/14/2016 41* 0 - 37 U/L Final  . ALT 10/14/2016 36* 0 - 35 U/L Final  . Total Protein 10/14/2016 8.1  6.0 - 8.3 g/dL Final  . Albumin 10/14/2016 4.5  3.5 - 5.2 g/dL Final    . Calcium 10/14/2016 10.2  8.4 - 10.5 mg/dL Final  . GFR 10/14/2016 84.98  >60.00 mL/min Final  . Hgb A1c MFr Bld 10/14/2016 7.1* 4.6 - 6.5 % Final         Medication List       Accurate as of 10/19/16  8:38 AM. Always use your most recent med list.          amLODipine 10 MG tablet Commonly known as:  NORVASC Take 1 tablet (10 mg total) by mouth daily.   azaTHIOprine 50 MG tablet Commonly known as:  IMURAN Take 50 mg by mouth daily.   blood glucose meter kit and supplies Kit Dispense based on patient and insurance preference. Use up to four times daily as directed. (FOR ICD-9 250.00, 250.01).   glucose blood test strip Test blood sugar twice daily. Pt uses one touch ultra   ibuprofen 800 MG tablet Commonly known as:  ADVIL,MOTRIN TAKE 1 TABLET(800 MG) BY MOUTH EVERY 8 HOURS AS NEEDED   insulin aspart protamine - aspart (70-30) 100 UNIT/ML FlexPen Commonly known as:  NOVOLOG MIX 70/30 FLEXPEN Inject 0.5 mLs (50 Units total) into the skin 3 (three) times daily. INJECT 20 UNITS INTO THE SKIN TWICE DAILY WITH A MEAL   Lancets 30G Misc Test blood sugars twice daily   lisinopril-hydrochlorothiazide 20-25 MG tablet Commonly known as:  PRINZIDE,ZESTORETIC Take 1 tablet by mouth daily.   omeprazole 40 MG capsule Commonly known as:  PRILOSEC Take 1 capsule (40 mg total) by mouth daily.   ondansetron 4 MG tablet Commonly known as:  ZOFRAN Take 1 tablet (4 mg total) by mouth every 8 (eight) hours as needed for nausea or vomiting.   oxyCODONE 5 MG immediate release tablet Commonly known as:  ROXICODONE Take 1 tablet (5 mg total) by mouth every 4 (four) hours as needed for severe pain.   Pen Needles 32G X 4 MM Misc 1 each by Does not apply route daily.   pravastatin 20 MG tablet Commonly known as:  PRAVACHOL Take 1 tablet (20 mg total) by mouth daily.   predniSONE 10 MG tablet Commonly known as:  DELTASONE Take 10 mg by mouth 3 (three) times daily. 6 weeks on  and 6 weeks off   UCERIS 9 MG Tb24 Generic drug:  Budesonide TK 1 T PO QD IN THE  MORNING   XIGDUO XR 09-999 MG Tb24 Generic drug:  Dapagliflozin-Metformin HCl ER Take 1 tablet by mouth daily. One a day       Allergies:  Allergies  Allergen Reactions  . Codeine Nausea Only  . Metformin And Related     Diarrhea, nausea    Past Medical History:  Diagnosis Date  . 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 Allergy   . Biceps tendonitis 2/12   Dr. Gladstone Lighter  . Diabetes mellitus    type II  . History of blood transfusion 1988   hysterectomy  . History of mammogram 05/25/12   stable, no suspicious finding, extremely dense breast tissue  . Hyperlipidemia   . Hypertension   . Neutropenia, unspecified 02/2013   slight, likely ethnicity related  . Pneumococcal vaccine refused 03/2013  . Refused influenza vaccine 03/2013  . S/P hysterectomy   . Sarcoidosis ([place])   . Shortness of breath dyspnea    with exertion   . Supraspinatus tendonitis 2/12   Dr. Gladstone Lighter  . Wears glasses     Past Surgical History:  Procedure Laterality Date  . ABDOMINAL HYSTERECTOMY  1990   and BSO; endometriosis  . ANTERIOR CERVICAL DECOMP/DISCECTOMY FUSION N/A 06/20/2013   Procedure: ANTERIOR CERVICAL DECOMPRESSION/DISCECTOMY FUSION 1 LEVEL;  Surgeon: Ophelia Charter, MD;  Location: [facility];  Service: Neurosurgery;  Laterality: N/A;  C45 anterior cervical decompression with fusion interbody prothesis plating and bonegraft  . BREAST REDUCTION SURGERY  1994  . CARPAL TUNNEL RELEASE Right 06/20/2013   Procedure: CARPAL TUNNEL RELEASE;  Surgeon: Ophelia Charter, MD;  Location: [facility];  Service: Neurosurgery;  Laterality: Right;  RIGHT carpal tunnel release  . COLONOSCOPY  03/06/12   sigmoid polyp; repeat 10 years; Dr. Tora Duck  . LUNG SURGERY  1987   endometriosis in lungs  . SUPRACLAVICAL NODE BIOPSY Left  01/14/2016   Procedure: LEFT SUPRACLAVICAL NODE BIOPSY;  Surgeon: Melrose Nakayama, MD;  Location: [facility];  Service: Thoracic;  Laterality: Left;    Family History  Problem Relation Age of Onset  . Heart disease Father 58    died of MI, CABG   . Multiple sclerosis Sister   . Asthma Brother   . Cancer Paternal Grandmother     breast  . Diabetes Other     maternal and paternal grandmother  . Cancer Maternal Uncle     hematologic  . Hypertension Maternal Grandmother   . Cancer Maternal Grandfather     prostate  . Hypertension Maternal Grandfather   . Asthma Brother   . Eczema Sister   . Allergies Sister   . Healthy Daughter     Social History:  reports that she has never smoked. She has never used smokeless tobacco. She reports that she drinks alcohol. She reports that she does not use drugs.   Review of Systems   Lipid history: On Pravastatin 40 from PCP, follow-up labs to be done    Lab Results  Component Value Date   CHOL 250 (H) 09/02/2016   HDL 60.60 09/02/2016   LDLCALC 175 (H) 09/02/2016   TRIG 72.0 09/02/2016   CHOLHDL 4 09/02/2016           Hypertension:Treated since 1997, Blood pressure relatively Lower.   BP Readings from Last 3 Encounters:  10/19/16 112/68  09/15/16 140/80  09/12/16 134/91     Most recent eye  exam was 3/17, reportedly normal  Most recent foot exam: 8/17  Prednisone given for sarcoidosis  in 3/17 for 6 weeks, again started in early [place] stopped this and is on another drug now Liver functions are still not normal  LABS:  Lab on 10/14/2016  Component Date Value Ref Range Status  . Sodium 10/14/2016 139  135 - 145 mEq/L Final  . Potassium 10/14/2016 3.8  3.5 - 5.1 mEq/L Final  . Chloride 10/14/2016 100  96 - 112 mEq/L Final  . CO2 10/14/2016 30  19 - 32 mEq/L Final  . Glucose, Bld 10/14/2016 168* 70 - 99 mg/dL Final  . BUN 10/14/2016 19  6 - 23 mg/dL Final  . Creatinine, Ser 10/14/2016 0.88  0.40 - 1.20 mg/dL Final  .  Total Bilirubin 10/14/2016 0.9  0.2 - 1.2 mg/dL Final  . Alkaline Phosphatase 10/14/2016 237* 39 - 117 U/L Final  . AST 10/14/2016 41* 0 - 37 U/L Final  . ALT 10/14/2016 36* 0 - 35 U/L Final  . Total Protein 10/14/2016 8.1  6.0 - 8.3 g/dL Final  . Albumin 10/14/2016 4.5  3.5 - 5.2 g/dL Final  . Calcium 10/14/2016 10.2  8.4 - 10.5 mg/dL Final  . GFR 10/14/2016 84.98  >60.00 mL/min Final  . Hgb A1c MFr Bld 10/14/2016 7.1* 4.6 - 6.5 % Final    Physical Examination:  BP 112/68   Pulse (!) 108   Ht _0  (1.626 m)   Wt 148 lb (67.1 kg)   LMP 12/27/1987   SpO2 96%   BMI 25.40 kg/m           ASSESSMENT:  Diabetes type 2, uncontrolled    See history of present illness for detailed discussion of current diabetes management, blood sugar patterns and problems identified  She still appears to be requiring insulin even without any prednisone. Blood sugars have been checked sporadically at home and they appear to be mostly high with some improvement in the mornings only She has not been understanding the need to check blood sugars at various times and to take insulin to cover all meals  HYPERTENSION: Appears better controlled now  PLAN:    She will check blood sugars after various meals by rotation  She will try to use the same meter all the time or bring both her meters for download on the next visit  Will empirically start with 10 units of insulin for breakfast and supper and reduce lunchtime dose by 10 units  If she is not having consistent control are overnight hypoglycemia may need to consider Novolog and Toujeo regimen  Discussed blood sugar targets and will review blood sugars by phone in 2 weeks.  She will also follow-up with her PCP for her lipids  Influenza vaccine given, discussed importance of protection with this  Patient Instructions  Check blood sugars on waking up  2-3x weekly  Also check blood sugars about 2 hours after a meal and do this after different meals  by rotation  Recommended blood sugar levels on waking up is 90-130 and about 2 hours after meal is 130-160  Please bring your blood sugar monitor to each visit, thank you  INSULIN 10 UNITS BEFORE BFST AND SUPPER AND 15 before supper  Call sugar readings in 2 weeks  Counseling time on subjects discussed above is over 50% of today's 25 minute visit   Loella Hickle 10/19/2016, 8:38 AM   Note: This office note was prepared with Dragon voice recognition system  technology. Any transcriptional errors that result from this process are unintentional.

## 2016-10-19 NOTE — Patient Instructions (Addendum)
Check blood sugars on waking up  2-3x weekly  Also check blood sugars about 2 hours after a meal and do this after different meals by rotation  Recommended blood sugar levels on waking up is 90-130 and about 2 hours after meal is 130-160  Please bring your blood sugar monitor to each visit, thank you  INSULIN 10 UNITS BEFORE BFST AND SUPPER AND 15 before supper  Call sugar readings in 2 weeks

## 2016-10-24 LAB — COMPREHENSIVE METABOLIC PANEL
A/G RATIO: 1.8 (ref 1.2–2.2)
ALBUMIN: 4.9 g/dL (ref 3.5–5.5)
ALT: 43 IU/L — ABNORMAL HIGH (ref 0–32)
AST: 53 IU/L — ABNORMAL HIGH (ref 0–40)
Alkaline Phosphatase: 269 IU/L — ABNORMAL HIGH (ref 39–117)
BILIRUBIN TOTAL: 1.2 mg/dL (ref 0.0–1.2)
BUN / CREAT RATIO: 23 (ref 9–23)
BUN: 22 mg/dL (ref 6–24)
CHLORIDE: 97 mmol/L (ref 96–106)
CO2: 25 mmol/L (ref 18–29)
Calcium: 10.3 mg/dL — ABNORMAL HIGH (ref 8.7–10.2)
Creatinine, Ser: 0.94 mg/dL (ref 0.57–1.00)
GFR calc non Af Amer: 68 mL/min/{1.73_m2} (ref >59)
GFR, EST AFRICAN AMERICAN: 78 mL/min/{1.73_m2} (ref >59)
Globulin, Total: 2.8 g/dL (ref 1.5–4.5)
Glucose: 149 mg/dL — ABNORMAL HIGH (ref 65–99)
POTASSIUM: 4.4 mmol/L (ref 3.5–5.2)
Sodium: 142 mmol/L (ref 134–144)
TOTAL PROTEIN: 7.7 g/dL (ref 6.0–8.5)

## 2016-10-24 LAB — CBC WITH DIFFERENTIAL/PLATELET
BASOS: 0 %
Basophils Absolute: 0 10*3/uL (ref 0.0–0.2)
EOS (ABSOLUTE): 0.2 10*3/uL (ref 0.0–0.4)
Eos: 5 %
Hematocrit: 41.5 % (ref 34.0–46.6)
Hemoglobin: 13.7 g/dL (ref 11.1–15.9)
IMMATURE GRANS (ABS): 0 10*3/uL (ref 0.0–0.1)
Immature Granulocytes: 0 %
LYMPHS ABS: 0.8 10*3/uL (ref 0.7–3.1)
LYMPHS: 22 %
MCH: 29.4 pg (ref 26.6–33.0)
MCHC: 33 g/dL (ref 31.5–35.7)
MCV: 89 fL (ref 79–97)
Monocytes Absolute: 0.4 10*3/uL (ref 0.1–0.9)
Monocytes: 10 %
NEUTROS ABS: 2.4 10*3/uL (ref 1.4–7.0)
Neutrophils: 63 %
PLATELETS: 387 10*3/uL — AB (ref 150–379)
RBC: 4.66 x10E6/uL (ref 3.77–5.28)
RDW: 14.2 % (ref 12.3–15.4)
WBC: 3.8 10*3/uL (ref 3.4–10.8)

## 2016-10-24 LAB — QUANTIFERON TB GOLD ASSAY (BLOOD)

## 2016-10-24 LAB — VITAMIN D 25 HYDROXY (VIT D DEFICIENCY, FRACTURES): Vit D, 25-Hydroxy: 14.4 ng/mL — ABNORMAL LOW (ref 30.0–100.0)

## 2016-10-24 LAB — QUANTIFERON IN TUBE
QFT TB AG MINUS NIL VALUE: 0.01 IU/mL
QUANTIFERON MITOGEN VALUE: 2.2 IU/mL
QUANTIFERON NIL VALUE: 0.2 [IU]/mL
QUANTIFERON TB AG VALUE: 0.21 [IU]/mL
QUANTIFERON TB GOLD: NEGATIVE

## 2016-10-24 LAB — PARATHYROID HORMONE, INTACT (NO CA): PTH: 16 pg/mL (ref 15–65)

## 2016-10-24 LAB — HEPATITIS B SURFACE ANTIGEN: HEP B S AG: NEGATIVE

## 2016-10-24 LAB — VITAMIN D 1,25 DIHYDROXY
Vitamin D 1, 25 (OH)2 Total: 80 pg/mL
Vitamin D2 1, 25 (OH)2: <10 pg/mL
Vitamin D3 1, 25 (OH)2: 80 pg/mL

## 2016-11-02 ENCOUNTER — Telehealth: Payer: Self-pay | Admitting: Endocrinology

## 2016-11-11 ENCOUNTER — Encounter: Payer: Self-pay | Admitting: Medical

## 2016-11-11 ENCOUNTER — Ambulatory Visit (INDEPENDENT_AMBULATORY_CARE_PROVIDER_SITE_OTHER): Payer: BLUE CROSS/BLUE SHIELD | Admitting: Medical

## 2016-11-11 VITALS — BP 144/90 | HR 103 | Wt 152.0 lb

## 2016-11-11 DIAGNOSIS — Z23 Encounter for immunization: Secondary | ICD-10-CM | POA: Diagnosis not present

## 2016-11-11 DIAGNOSIS — D869 Sarcoidosis, unspecified: Secondary | ICD-10-CM | POA: Diagnosis not present

## 2016-11-11 DIAGNOSIS — Z7185 Encounter for immunization safety counseling: Secondary | ICD-10-CM

## 2016-11-11 DIAGNOSIS — N760 Acute vaginitis: Secondary | ICD-10-CM

## 2016-11-11 DIAGNOSIS — T887XXA Unspecified adverse effect of drug or medicament, initial encounter: Secondary | ICD-10-CM | POA: Diagnosis not present

## 2016-11-11 DIAGNOSIS — T50905A Adverse effect of unspecified drugs, medicaments and biological substances, initial encounter: Secondary | ICD-10-CM

## 2016-11-11 DIAGNOSIS — Z7189 Other specified counseling: Secondary | ICD-10-CM | POA: Diagnosis not present

## 2016-11-11 LAB — POCT WET PREP (WET MOUNT)

## 2016-11-11 LAB — POCT URINALYSIS DIPSTICK
Bilirubin, UA: NEGATIVE
Ketones, UA: NEGATIVE
Leukocytes, UA: NEGATIVE
Nitrite, UA: NEGATIVE
PH UA: 6
Protein, UA: NEGATIVE
RBC UA: NEGATIVE
SPEC GRAV UA: 1.025
UROBILINOGEN UA: NEGATIVE

## 2016-11-11 MED ORDER — METRONIDAZOLE 500 MG PO TABS: 500.0000 mg | ORAL_TABLET | Freq: Three times a day (TID) | ORAL | 0 refills | Status: DC

## 2016-11-11 MED ORDER — FLUCONAZOLE 150 MG PO TABS: ORAL_TABLET | ORAL | 0 refills | Status: DC

## 2016-11-11 NOTE — Progress Notes (Signed)
Subjective: Chief Complaint  Patient presents with  . yeast infection    yeast infwction    Here for possible yeast infection.  Having some vaginal discharge, burning, but not so much itching.  No burning with urination, no urine frequency.  Does have some burning around anus but thinks its from possible discharge.    I had just treated her for yeast infection 09/09/16.      She ended up seeing a specialist in [place] for second opinion.   They mentioned her doing a study or doing a medication infusion for sarcoidosis.   They are concerned about her eye symptom as it relates to sarcoidosis. Pain is more tolerable now.    Sees endocrinology, glucose runs 88-240, can vary.   Not quite established.   Not on steroid currently.  She got flu shot recently in [facility].  Past Medical History:  Diagnosis Date  . 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 Allergy   . Biceps tendonitis 2/12   Dr. Gladstone Lighter  . Diabetes mellitus    type II  . History of blood transfusion 1988   hysterectomy  . History of mammogram 05/25/12   stable, no suspicious finding, extremely dense breast tissue  . Hyperlipidemia   . Hypertension   . Neutropenia, unspecified 02/2013   slight, likely ethnicity related  . Pneumococcal vaccine refused 03/2013  . Refused influenza vaccine 03/2013  . S/P hysterectomy   . Sarcoidosis ([place])   . Shortness of breath dyspnea    with exertion   . Supraspinatus tendonitis 2/12   Dr. Gladstone Lighter  . Wears glasses    Current Outpatient Prescriptions on File Prior to Visit  Medication Sig Dispense Refill  . amLODipine (NORVASC) 10 MG tablet Take 1 tablet (10 mg total) by mouth daily. 90 tablet 3  . azaTHIOprine (IMURAN) 50 MG tablet Take 50 mg by mouth daily.    . blood glucose meter kit and supplies KIT Dispense based on patient and insurance preference. Use up to four times daily as directed. (FOR ICD-9  250.00, 250.01). 1 each 0  . Dapagliflozin-Metformin HCl ER (XIGDUO XR) 09-999 MG TB24 Take 1 tablet by mouth daily. One a day     . glucose blood test strip Test blood sugar twice daily. Pt uses one touch ultra 100 each 11  . insulin aspart protamine - aspart (NOVOLOG MIX 70/30 FLEXPEN) (70-30) 100 UNIT/ML FlexPen Inject 0.5 mLs (50 Units total) into the skin 3 (three) times daily. INJECT 20 UNITS INTO THE SKIN TWICE DAILY WITH A MEAL (Patient taking differently: Inject 25 Units into the skin daily with lunch. ) 100 mL 3  . Insulin Pen Needle (PEN NEEDLES) 32G X 4 MM MISC 1 each by Does not apply route daily. 200 each 1  . Lancets 30G MISC Test blood sugars twice daily 100 each 2  . lisinopril-hydrochlorothiazide (PRINZIDE,ZESTORETIC) 20-25 MG tablet Take 1 tablet by mouth daily. 90 tablet 3  . pravastatin (PRAVACHOL) 20 MG tablet Take 1 tablet (20 mg total) by mouth daily. 90 tablet 3   No current facility-administered medications on file prior to visit.    Past Surgical History:  Procedure Laterality Date  . ABDOMINAL HYSTERECTOMY  1990   and BSO; endometriosis  . ANTERIOR CERVICAL DECOMP/DISCECTOMY FUSION N/A 06/20/2013   Procedure: ANTERIOR CERVICAL DECOMPRESSION/DISCECTOMY FUSION 1 LEVEL;  Surgeon: Ophelia Charter, MD;  Location: [facility];  Service: Neurosurgery;  Laterality: N/A;  C45 anterior cervical decompression with fusion interbody prothesis plating and bonegraft  . BREAST REDUCTION SURGERY  1994  . CARPAL TUNNEL RELEASE Right 06/20/2013   Procedure: CARPAL TUNNEL RELEASE;  Surgeon: Ophelia Charter, MD;  Location: [facility];  Service: Neurosurgery;  Laterality: Right;  RIGHT carpal tunnel release  . COLONOSCOPY  03/06/12   sigmoid polyp; repeat 10 years; Dr. Tora Duck  . LUNG SURGERY  1987   endometriosis in lungs  . SUPRACLAVICAL NODE BIOPSY Left 01/14/2016   Procedure: LEFT SUPRACLAVICAL NODE BIOPSY;  Surgeon: Melrose Nakayama, MD;  Location: [facility];  Service:  Thoracic;  Laterality: Left;    ROS as in subjective   Objective: BP (!) 144/90   Pulse (!) 103   Wt 152 lb (68.9 kg)   LMP 12/27/1987   SpO2 95%   BMI 26.09 kg/m   BP Readings from Last 3 Encounters:  11/11/16 (!) 144/90  10/19/16 112/68  09/15/16 140/80   Gen: wd, wn, nad Gyn: Normal external genitalia without lesions, vagina with normal mucosa, s/p hysterectomy, mild white creamy abnormal vaginal discharge.  Adnexa not enlarged, nontender, no masses.  Exam chaperoned by nurse. Rectal: anus normal appearing, some vaginal discharge draining to the anus area    Assessment: Encounter Diagnoses  Name Primary?  . Vaginitis and vulvovaginitis Yes  . Sarcoidosis ([place])   . Vaccine counseling   . Need for prophylactic vaccination against Streptococcus pneumoniae (pneumococcus)   . Need for TD vaccine   . Adverse effect of drug, initial encounter      Plan Vaginitis - wet prep today with some clue cells, lots of WBCs, few yeasts.  This could possibly be associated with xigduo or elevated sugars.   Medications below. Recheck swab in 3-4 weeks.  sarcoid - managed by specialist in [place] pneumococcal 23 and Td vaccines.   She will check insurance coverage for this  Kassidie was seen today for yeast infection.  Diagnoses and all orders for this visit:  Vaginitis and vulvovaginitis -     Urinalysis Dipstick -     POCT Wet Prep ([facility])  Sarcoidosis [facility])  Vaccine counseling  Need for prophylactic vaccination against Streptococcus pneumoniae (pneumococcus)  Need for TD vaccine  Adverse effect of drug, initial encounter  Other orders -     metroNIDAZOLE (FLAGYL) 500 MG tablet; Take 1 tablet (500 mg total) by mouth 3 (three) times daily. -     fluconazole (DIFLUCAN) 150 MG tablet; 1 tablet every other day   F/u prn.

## 2016-11-21 LAB — POCT WET PREP (WET MOUNT)
TRICHOMONAS WET PREP HPF POC: ABSENT
WBC, Wet Prep HPF POC: NEGATIVE

## 2016-11-25 ENCOUNTER — Other Ambulatory Visit: Payer: BLUE CROSS/BLUE SHIELD

## 2016-11-30 ENCOUNTER — Ambulatory Visit: Payer: BLUE CROSS/BLUE SHIELD | Admitting: Endocrinology

## 2016-12-20 ENCOUNTER — Other Ambulatory Visit: Payer: Self-pay | Admitting: Medical

## 2017-01-02 ENCOUNTER — Ambulatory Visit (INDEPENDENT_AMBULATORY_CARE_PROVIDER_SITE_OTHER): Payer: BLUE CROSS/BLUE SHIELD | Admitting: Family Medicine

## 2017-01-02 ENCOUNTER — Encounter: Payer: Self-pay | Admitting: Family Medicine

## 2017-01-02 VITALS — BP 120/70 | HR 84 | Resp 16 | Wt 155.0 lb

## 2017-01-02 DIAGNOSIS — R6884 Jaw pain: Secondary | ICD-10-CM

## 2017-01-02 NOTE — Patient Instructions (Signed)
Use heat to the area and eat soft foods for the next couple of days. If you notice fever, severe pain, rash in the area call me.   Temporomandibular Joint Syndrome Temporomandibular joint (TMJ) syndrome is a condition that affects the joints between your jaw and your skull. The TMJs are located near your ears and allow your jaw to open and close. These joints and the nearby muscles are involved in all movements of the jaw. People with TMJ syndrome have pain in the area of these joints and muscles. Chewing, biting, or other movements of the jaw can be difficult or painful. TMJ syndrome can be caused by various things. In many cases, the condition is mild and goes away within a few weeks. For some people, the condition can become a long-term problem. What are the causes? Possible causes of TMJ syndrome include:  Grinding your teeth or clenching your jaw. Some people do this when they are under stress.  Arthritis.  Injury to the jaw.  Head or neck injury.  Teeth or dentures that are not aligned well. In some cases, the cause of TMJ syndrome may not be known. What are the signs or symptoms? The most common symptom is an aching pain on the side of the head in the area of the TMJ. Other symptoms may include:  Pain when moving your jaw, such as when chewing or biting.  Being unable to open your jaw all the way.  Making a clicking sound when you open your mouth.  Headache.  Earache.  Neck or shoulder pain. How is this diagnosed? Diagnosis can usually be made based on your symptoms, your medical history, and a physical exam. Your health care provider may check the range of motion of your jaw. Imaging tests, such as X-rays or an MRI, are sometimes done. You may need to see your dentist to determine if your teeth and jaw are lined up correctly. How is this treated? TMJ syndrome often goes away on its own. If treatment is needed, the options may include:  Eating soft foods and applying ice  or heat.  Medicines to relieve pain or inflammation.  Medicines to relax the muscles.  A splint, bite plate, or mouthpiece to prevent teeth grinding or jaw clenching.  Relaxation techniques or counseling to help reduce stress.  Transcutaneous electrical nerve stimulation (TENS). This helps to relieve pain by applying an electrical current through the skin.  Acupuncture. This is sometimes helpful to relieve pain.  Jaw surgery. This is rarely needed. Follow these instructions at home:  Take medicines only as directed by your health care provider.  Eat a soft diet if you are having trouble chewing.  Apply ice to the painful area.  Put ice in a plastic bag.  Place a towel between your skin and the bag.  Leave the ice on for 20 minutes, 2-3 times a day.  Apply a warm compress to the painful area as directed.  Massage your jaw area and perform any jaw stretching exercises as recommended by your health care provider.  If you were given a mouthpiece or bite plate, wear it as directed.  Avoid foods that require a lot of chewing. Do not chew gum.  Keep all follow-up visits as directed by your health care provider. This is important. Contact a health care provider if:  You are having trouble eating.  You have new or worsening symptoms. Get help right away if:  Your jaw locks open or closed. This information is not  intended to replace advice given to you by your health care provider. Make sure you discuss any questions you have with your health care provider. Document Released: 09/06/2001 Document Revised: 08/11/2016 Document Reviewed: 07/17/2014 Elsevier Interactive Patient Education  2017 [employer].

## 2017-01-02 NOTE — Progress Notes (Signed)
   Subjective:    Patient ID: Bethany Webb, female    DOB: [birth year], 56 y.o.   MRN: [id number]  HPI Chief Complaint Patient presents with . Jaw Pain-pt reports "bone pain" onset Sat while eating chips. Reports that pain is radiating from right ear into right side of neck. Swelling noted per pt   She is here with complaints of a 2 day history of pain to her right jaw with chewing and occasionally notices a soreness radiating down to her right anterior neck. States pain improved with rest. Area is tender to palpation. She was able to eat oatmeal today without any pain. No popping or locking of her jaw.  Denies toothache or gum soreness.  No rash, tingling or abnormal sensation of her face.   Denies fever, chills, body aches, ear pain, sore throat, cough, chest pain, palpitations, abdominal pain, N/V/D.   She had her dental cleaning 2 months ago. No history of TMJ.   She does have sarcoidosis and is being treated.     Reviewed allergies, medications, past medical, and social history.    Review of Systems Pertinent positives and negatives in the history of present illness.     Objective:   Physical Exam  Constitutional: She is oriented to person, place, and time. She appears well-developed and well-nourished. She does not have a sickly appearance. No distress.  HENT:  Right Ear: Hearing, tympanic membrane and ear canal normal. No mastoid tenderness.  Left Ear: Tympanic membrane and ear canal normal. No mastoid tenderness.  Nose: Nose normal. Right sinus exhibits no maxillary sinus tenderness and no frontal sinus tenderness. Left sinus exhibits no maxillary sinus tenderness and no frontal sinus tenderness.  Mouth/Throat: Uvula is midline, oropharynx is clear and moist and mucous membranes are normal.  Right TMJ tenderness, pain with opening and closing. No crepitus. Sensation intact, no rash.   Eyes: Conjunctivae are normal.  Neck: Full passive range of motion without pain. Neck  supple.  Cardiovascular: Normal rate, regular rhythm and normal heart sounds.   Pulmonary/Chest: Effort normal and breath sounds normal.  Lymphadenopathy:    She has no cervical adenopathy.       Right: No supraclavicular adenopathy present.       Left: No supraclavicular adenopathy present.  Mild right anterior cervical tenderness.   Neurological: She is alert and oriented to person, place, and time. No cranial nerve deficit or sensory deficit.  Skin: Skin is warm and dry. No rash noted. No pallor.   BP 120/70   Pulse 84   Resp 16   Wt 155 lb (70.3 kg)   LMP 12/27/1987   SpO2 98%   BMI 26.61 kg/m       Assessment & Plan:  Jaw pain  Discussed that she appears to have some TMJ discomfort and recommend conservative treatment at this point. Her pain is 1/10 and not that bothersome today. She states she cannot take NSAIDs or Tylenol due to chronic liver issues. Recommend heat to the area, soft diet and avoid chewing gum or grinding her teeth. Discuss red flags such as fever, worsening pain in spite of soft diet, rash, etc. She will call or return if she worsens or is not improving.

## 2017-01-04 ENCOUNTER — Other Ambulatory Visit (INDEPENDENT_AMBULATORY_CARE_PROVIDER_SITE_OTHER): Payer: BLUE CROSS/BLUE SHIELD

## 2017-01-04 DIAGNOSIS — E1165 Type 2 diabetes mellitus with hyperglycemia: Secondary | ICD-10-CM | POA: Diagnosis not present

## 2017-01-04 DIAGNOSIS — Z794 Long term (current) use of insulin: Secondary | ICD-10-CM | POA: Diagnosis not present

## 2017-01-04 LAB — BASIC METABOLIC PANEL
BUN: 15 mg/dL (ref 6–23)
CALCIUM: 9.6 mg/dL (ref 8.4–10.5)
CO2: 28 mEq/L (ref 19–32)
CREATININE: 0.81 mg/dL (ref 0.40–1.20)
Chloride: 103 mEq/L (ref 96–112)
GFR: 93.44 mL/min (ref >60.00)
Glucose, Bld: 77 mg/dL (ref 70–99)
POTASSIUM: 3.5 meq/L (ref 3.5–5.1)
Sodium: 141 mEq/L (ref 135–145)

## 2017-01-05 LAB — FRUCTOSAMINE: Fructosamine: 272 umol/L (ref 0–285)

## 2017-01-06 ENCOUNTER — Ambulatory Visit: Payer: BLUE CROSS/BLUE SHIELD | Admitting: Endocrinology

## 2017-01-10 ENCOUNTER — Encounter: Payer: Self-pay | Admitting: Endocrinology

## 2017-01-10 ENCOUNTER — Ambulatory Visit (INDEPENDENT_AMBULATORY_CARE_PROVIDER_SITE_OTHER): Payer: BLUE CROSS/BLUE SHIELD | Admitting: Endocrinology

## 2017-01-10 VITALS — BP 130/68 | HR 98 | Ht 64.0 in | Wt 156.0 lb

## 2017-01-10 DIAGNOSIS — I10 Essential (primary) hypertension: Secondary | ICD-10-CM | POA: Diagnosis not present

## 2017-01-10 DIAGNOSIS — Z794 Long term (current) use of insulin: Secondary | ICD-10-CM | POA: Diagnosis not present

## 2017-01-10 DIAGNOSIS — E1165 Type 2 diabetes mellitus with hyperglycemia: Secondary | ICD-10-CM

## 2017-01-10 DIAGNOSIS — E782 Mixed hyperlipidemia: Secondary | ICD-10-CM

## 2017-01-10 NOTE — Patient Instructions (Addendum)
If eating oatmeal then take 12 in am  Must take 25 BEFORE EATING  DINNER DOSE based on meal size: 10-15 units BEFORE the meal  No Pepsi  Walking program  Check blood sugars on waking up 2x per week  Also check blood sugars about 2 hours after a meal and do this after different meals by rotation  Recommended blood sugar levels on waking up is 90-130 and about 2 hours after meal is 130-160  Please bring your blood sugar monitor to each visit, thank you

## 2017-01-10 NOTE — Progress Notes (Signed)
Patient ID: Bethany Webb, female   DOB: 02-26-1961, 56 y.o.   MRN: [id number]           Reason for Appointment: Follow-up for Type 2 Diabetes  Referring physician: Tysinger   History of Present Illness:          Date of diagnosis of type 2 diabetes mellitus:        Background history:   She has been on various medications over the last few years and followed sporadically for her diabetes She was briefly taking metformin but larger doses she was having GI side effects Was on Xigduo last year but this was stopped when liver functions were normal in 1/16 and insulin started  Her [name] was stopped in December  Recent history:   INSULIN regimen is:  Novolog Mix:  10 units in the morning at breakfast, 25 pc lunch, 10-25 after supper      Non-insulin hypoglycemic drugs the patient is taking are: Xigduo 09/999 daily  Most recent A1c is 7.1, fructosamine 272  Current management, blood sugar patterns and problems identified:  She now reveals that she is taking her insulin randomly after meals, generally 1-2 hours later instead of before eating  She has started gaining weight also  She thinks that her diet has been inconsistent with periodic snacking and not always eating proper meals  Meal times are also quite variable  She generally takes her blood sugar when she comes back home late evening and this may be after meals  Also she thinks she is adjusting her insulin at least in the evening based on what her blood sugars and is taking variable doses  Occasionally will drink Pepsi  Blood sugars late in the evening are mostly around 180-200+ and sporadically higher in the afternoon; checking mostly late in the evening       Side effects from medications have been: Diarrhea from regular metformin  Compliance with the medical regimen: Fair Hypoglycemia:   never  Glucose monitoring:  done 3 times a day         Glucometer:  contour       Blood Glucose readings by time of day from  review of meter for the last 2 weeks:  Mean values apply above for all meters except median for One Touch  PRE-MEAL Fasting Lunch Dinner Bedtime Overall  Glucose range: 162  82-147   169-to 48    Mean/median:    184  192   POST-MEAL PC Breakfast PC Lunch PC Dinner  Glucose range:  347, 285    Mean/median:         Self-care: The diet that the patient has been following is: tries to limit bread, sugar, fried food.     Meal times are:  Breakfast is Late morning, Dinner: 7-9 pm  Typical meal intake: Breakfast is egg/oatmeal.  Lunch will be chicken salad.  Has low fat meal at dinner.  Snacks: Nuts, walnuts, peanut butter  No regular soft drinks               Dietician visit, most recent:none               CDE: 9/17 Exercise: none   Weight history:  Wt Readings from Last 3 Encounters:  01/10/17 156 lb (70.8 kg)  01/02/17 155 lb (70.3 kg)  11/11/16 152 lb (68.9 kg)    Glycemic control:   Lab Results  Component Value Date   HGBA1C 7.1 (H) 10/14/2016   HGBA1C  9.7 08/11/2016   HGBA1C 7.6 (H) 05/25/2016   Lab Results  Component Value Date   MICROALBUR <0.7 08/11/2016   LDLCALC 175 (H) 09/02/2016   CREATININE 0.81 01/04/2017   Lab Results  Component Value Date   MICRALBCREAT 1.4 08/11/2016   Lab on 01/04/2017  Component Date Value Ref Range Status  . Fructosamine 01/05/2017 272  0 - 285 umol/L Final   Comment: Published reference interval for apparently healthy subjects between age 93 and 39 is 56 - 285 umol/L and in a poorly controlled diabetic population is 228 - 563 umol/L with a mean of 396 umol/L.   Marland Kitchen Sodium 01/04/2017 141  135 - 145 mEq/L Final  . Potassium 01/04/2017 3.5  3.5 - 5.1 mEq/L Final  . Chloride 01/04/2017 103  96 - 112 mEq/L Final  . CO2 01/04/2017 28  19 - 32 mEq/L Final  . Glucose, Bld 01/04/2017 77  70 - 99 mg/dL Final  . BUN 01/04/2017 15  6 - 23 mg/dL Final  . Creatinine, Ser 01/04/2017 0.81  0.40 - 1.20 mg/dL Final  . Calcium 01/04/2017 9.6   8.4 - 10.5 mg/dL Final  . GFR 01/04/2017 93.44  >60.00 mL/min Final       Allergies as of 01/10/2017      Reactions   Codeine Nausea Only   Metformin And Related    Diarrhea, nausea      Medication List       Accurate as of 01/10/17  8:30 AM. Always use your most recent med list.          amLODipine 10 MG tablet Commonly known as:  NORVASC TAKE 1 TABLET BY MOUTH EVERY DAY   azaTHIOprine 50 MG tablet Commonly known as:  IMURAN Take 50 mg by mouth daily.   blood glucose meter kit and supplies Kit Dispense based on patient and insurance preference. Use up to four times daily as directed. (FOR ICD-9 250.00, 250.01).   glucose blood test strip Test blood sugar twice daily. Pt uses one touch ultra   insulin aspart protamine - aspart (70-30) 100 UNIT/ML FlexPen Commonly known as:  NOVOLOG MIX 70/30 FLEXPEN Inject 0.5 mLs (50 Units total) into the skin 3 (three) times daily. INJECT 20 UNITS INTO THE SKIN TWICE DAILY WITH A MEAL   Lancets 30G Misc Test blood sugars twice daily   lisinopril-hydrochlorothiazide 20-25 MG tablet Commonly known as:  PRINZIDE,ZESTORETIC Take 1 tablet by mouth daily.   Pen Needles 32G X 4 MM Misc 1 each by Does not apply route daily.   pravastatin 20 MG tablet Commonly known as:  PRAVACHOL Take 1 tablet (20 mg total) by mouth daily.   XIGDUO XR 09-999 MG Tb24 Generic drug:  Dapagliflozin-Metformin HCl ER Take 1 tablet by mouth daily. One a day       Allergies:  Allergies  Allergen Reactions  . Codeine Nausea Only  . Metformin And Related     Diarrhea, nausea    Past Medical History:  Diagnosis Date  . 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 Allergy   . Biceps tendonitis 2/12   Dr. Gladstone Lighter  . Diabetes mellitus    type II  . History of blood transfusion 1988   hysterectomy  . History of mammogram 05/25/12   stable, no suspicious finding,  extremely dense breast tissue  . Hyperlipidemia   . Hypertension   . Neutropenia, unspecified 02/2013  slight, likely ethnicity related  . Pneumococcal vaccine refused 03/2013  . Refused influenza vaccine 03/2013  . S/P hysterectomy   . Sarcoidosis ([place])   . Shortness of breath dyspnea    with exertion   . Supraspinatus tendonitis 2/12   Dr. Gladstone Lighter  . Wears glasses     Past Surgical History:  Procedure Laterality Date  . ABDOMINAL HYSTERECTOMY  1990   and BSO; endometriosis  . ANTERIOR CERVICAL DECOMP/DISCECTOMY FUSION N/A 06/20/2013   Procedure: ANTERIOR CERVICAL DECOMPRESSION/DISCECTOMY FUSION 1 LEVEL;  Surgeon: Ophelia Charter, MD;  Location: [facility];  Service: Neurosurgery;  Laterality: N/A;  C45 anterior cervical decompression with fusion interbody prothesis plating and bonegraft  . BREAST REDUCTION SURGERY  1994  . CARPAL TUNNEL RELEASE Right 06/20/2013   Procedure: CARPAL TUNNEL RELEASE;  Surgeon: Ophelia Charter, MD;  Location: [facility];  Service: Neurosurgery;  Laterality: Right;  RIGHT carpal tunnel release  . COLONOSCOPY  03/06/12   sigmoid polyp; repeat 10 years; Dr. Tora Duck  . LUNG SURGERY  1987   endometriosis in lungs  . SUPRACLAVICAL NODE BIOPSY Left 01/14/2016   Procedure: LEFT SUPRACLAVICAL NODE BIOPSY;  Surgeon: Melrose Nakayama, MD;  Location: [facility];  Service: Thoracic;  Laterality: Left;    Family History  Problem Relation Age of Onset  . Heart disease Father 19    died of MI, CABG   . Multiple sclerosis Sister   . Asthma Brother   . Cancer Paternal Grandmother     breast  . Diabetes Other     maternal and paternal grandmother  . Cancer Maternal Uncle     hematologic  . Hypertension Maternal Grandmother   . Cancer Maternal Grandfather     prostate  . Hypertension Maternal Grandfather   . Asthma Brother   . Eczema Sister   . Allergies Sister   . Healthy Daughter     Social History:  reports that she has never smoked. She has  never used smokeless tobacco. She reports that she drinks alcohol. She reports that she does not use drugs.   Review of Systems   Lipid history: On Pravastatin 20 from PCP, follow-up  With PCP pending   Lab Results  Component Value Date   CHOL 250 (H) 09/02/2016   HDL 60.60 09/02/2016   LDLCALC 175 (H) 09/02/2016   TRIG 72.0 09/02/2016   CHOLHDL 4 09/02/2016       Lab Results  Component Value Date   ALT 43 (H) 10/19/2016   ALT 95 (H) 01/21/2016        Hypertension:Treated since 1997, Blood pressure Controlled Potassium is 3.5   BP Readings from Last 3 Encounters:  01/10/17 130/68  01/02/17 120/70  11/11/16 (!) 144/90     Most recent eye exam was 3/17, reportedly normal  Most recent foot exam: 8/17  Prednisone given for sarcoidosis  in 3/17 for 6 weeks Currently not on treatment  LABS:  Lab on 01/04/2017  Component Date Value Ref Range Status  . Fructosamine 01/05/2017 272  0 - 285 umol/L Final   Comment: Published reference interval for apparently healthy subjects between age 72 and 61 is 33 - 285 umol/L and in a poorly controlled diabetic population is 228 - 563 umol/L with a mean of 396 umol/L.   Marland Kitchen Sodium 01/04/2017 141  135 - 145 mEq/L Final  . Potassium 01/04/2017 3.5  3.5 - 5.1 mEq/L Final  . Chloride 01/04/2017 103  96 - 112 mEq/L  Final  . CO2 01/04/2017 28  19 - 32 mEq/L Final  . Glucose, Bld 01/04/2017 77  70 - 99 mg/dL Final  . BUN 01/04/2017 15  6 - 23 mg/dL Final  . Creatinine, Ser 01/04/2017 0.81  0.40 - 1.20 mg/dL Final  . Calcium 01/04/2017 9.6  8.4 - 10.5 mg/dL Final  . GFR 01/04/2017 93.44  >60.00 mL/min Final    Physical Examination:  BP 130/68   Pulse 98   Ht _0  (1.626 m)   Wt 156 lb (70.8 kg)   LMP 12/27/1987   SpO2 94%   BMI 26.78 kg/m           ASSESSMENT:  Diabetes type 2, uncontrolled    See history of present illness for detailed discussion of current diabetes management, blood sugar patterns and problems  identified  Although her fructosamine is not significantly high her blood sugars are mostly high at home She is not understanding the need to take her insulin before eating and is taking it randomly 1-2 hours after eating both at lunch and supper Blood sugars are variable also Although she probably will do better with basal bolus insulin regimen she does not want to change now because of having a large supply of her 70/30 insulin  HYPERTENSION: Appears consistently controlled now  Potassium is low normal  PLAN:    She will check blood sugars either before or 2 hours after various meals by rotation  Also needs to do some readings fasting  She will try to take her insulin before meals consistently.  Given her guidelines on adjusting her dose based on her carbohydrate intake especially at suppertime  She will need to be more consistent with taking her insulin even when she is eating out and take it before eating  Consider continuous glucose monitoring with freestyle Libre on the next visit  Also discussed possibility of changing to basal bolus separate insulin regimen on the next visit  Follow-up in 6 weeks with A1c  Increase high potassium foods for her low normal potassium    Patient Instructions  If eating oatmeal then take 12 in am  Must take 25 BEFORE EATING  DINNER DOSE based on meal size: 10-15 units BEFORE the meal  No Pepsi  Walking program  Check blood sugars on waking up 2x per week  Also check blood sugars about 2 hours after a meal and do this after different meals by rotation  Recommended blood sugar levels on waking up is 90-130 and about 2 hours after meal is 130-160  Please bring your blood sugar monitor to each visit, thank you   Counseling time on subjects discussed above is over 50% of today's 25 minute visit   Glessie Eustice 01/10/2017, 8:30 AM   Note: This office note was prepared with Estate agent. Any  transcriptional errors that result from this process are unintentional.

## 2017-01-23 ENCOUNTER — Ambulatory Visit (INDEPENDENT_AMBULATORY_CARE_PROVIDER_SITE_OTHER): Payer: BLUE CROSS/BLUE SHIELD | Admitting: Medical

## 2017-01-23 ENCOUNTER — Encounter: Payer: Self-pay | Admitting: Medical

## 2017-01-23 VITALS — BP 128/72 | HR 96 | Wt 154.6 lb

## 2017-01-23 DIAGNOSIS — D869 Sarcoidosis, unspecified: Secondary | ICD-10-CM | POA: Diagnosis not present

## 2017-01-23 DIAGNOSIS — D72819 Decreased white blood cell count, unspecified: Secondary | ICD-10-CM | POA: Diagnosis not present

## 2017-01-23 DIAGNOSIS — R8290 Unspecified abnormal findings in urine: Secondary | ICD-10-CM | POA: Diagnosis not present

## 2017-01-23 DIAGNOSIS — N898 Other specified noninflammatory disorders of vagina: Secondary | ICD-10-CM | POA: Diagnosis not present

## 2017-01-23 DIAGNOSIS — Z79899 Other long term (current) drug therapy: Secondary | ICD-10-CM

## 2017-01-23 DIAGNOSIS — T887XXD Unspecified adverse effect of drug or medicament, subsequent encounter: Secondary | ICD-10-CM

## 2017-01-23 DIAGNOSIS — D709 Neutropenia, unspecified: Secondary | ICD-10-CM | POA: Diagnosis not present

## 2017-01-23 DIAGNOSIS — E118 Type 2 diabetes mellitus with unspecified complications: Secondary | ICD-10-CM | POA: Diagnosis not present

## 2017-01-23 DIAGNOSIS — T50905D Adverse effect of unspecified drugs, medicaments and biological substances, subsequent encounter: Secondary | ICD-10-CM

## 2017-01-23 LAB — POCT URINALYSIS DIPSTICK
BILIRUBIN UA: NEGATIVE
KETONES UA: NEGATIVE
Leukocytes, UA: NEGATIVE
Nitrite, UA: NEGATIVE
PH UA: 6
Protein, UA: NEGATIVE
RBC UA: NEGATIVE
SPEC GRAV UA: 1.025
Urobilinogen, UA: NEGATIVE

## 2017-01-23 LAB — POCT WET PREP (WET MOUNT): CLUE CELLS WET PREP WHIFF POC: NEGATIVE

## 2017-01-23 NOTE — Progress Notes (Signed)
Subjective: Chief Complaint  Patient presents with  . follow up  white blood counts low     white blood count , burn and  discharge.   Here for a couple concerns.   She had recent labs 4 days ago and white counts were low.  Has copy of lab showing WBC 2.58 K/CUMM, low, monocyte relative percent elevated 10.9%, absolute neutrophil low at 1.60 K/CUMM, absolute lymphocyte low at 0.63 K/CUMMM , otherwise CBC normal  Been on Azathioprine since 07/2016.  Had normal CBC 09/2016.  Has slightly low WBC in 07/2016.     Having ongoing burning and vaginal discharge.  Currently has had symptoms for the past month or so.    No new sexual partner or sexual activity in a long while (over a year).   After 11/11/16 visit she was treated with metronidazole and diflucan, and although discharge cleared up, the burning still remained.    Sugars recently average 100-250.   Still on Novolog 70/30 and Xigdeuo.   Recently was started on Uceris/budesonide oral. No other aggravating or relieving factors. No other complaint.   Past Medical History:  Diagnosis Date  . 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 Allergy   . Biceps tendonitis 2/12   Dr. Gladstone Lighter  . Diabetes mellitus    type II  . History of blood transfusion 1988   hysterectomy  . History of mammogram 05/25/12   stable, no suspicious finding, extremely dense breast tissue  . Hyperlipidemia   . Hypertension   . Neutropenia, unspecified 02/2013   slight, likely ethnicity related  . Pneumococcal vaccine refused 03/2013  . Refused influenza vaccine 03/2013  . S/P hysterectomy   . Sarcoidosis ([place])   . Shortness of breath dyspnea    with exertion   . Supraspinatus tendonitis 2/12   Dr. Gladstone Lighter  . Wears glasses    Current Outpatient Prescriptions on File Prior to Visit  Medication Sig Dispense Refill  . amLODipine (NORVASC) 10 MG tablet TAKE 1 TABLET BY MOUTH EVERY DAY 90  tablet 0  . azaTHIOprine (IMURAN) 50 MG tablet Take 50 mg by mouth daily.    . blood glucose meter kit and supplies KIT Dispense based on patient and insurance preference. Use up to four times daily as directed. (FOR ICD-9 250.00, 250.01). 1 each 0  . Dapagliflozin-Metformin HCl ER (XIGDUO XR) 09-999 MG TB24 Take 1 tablet by mouth daily. One a day     . glucose blood test strip Test blood sugar twice daily. Pt uses one touch ultra 100 each 11  . insulin aspart protamine - aspart (NOVOLOG MIX 70/30 FLEXPEN) (70-30) 100 UNIT/ML FlexPen Inject 0.5 mLs (50 Units total) into the skin 3 (three) times daily. INJECT 20 UNITS INTO THE SKIN TWICE DAILY WITH A MEAL (Patient taking differently: Inject 25 Units into the skin daily with lunch. 10 units in the morning if levels are high) 100 mL 3  . Insulin Pen Needle (PEN NEEDLES) 32G X 4 MM MISC 1 each by Does not apply route daily. 200 each 1  . Lancets 30G MISC Test blood sugars twice daily 100 each 2  . lisinopril-hydrochlorothiazide (PRINZIDE,ZESTORETIC) 20-25 MG tablet Take 1 tablet by mouth daily. 90 tablet 3  . pravastatin (PRAVACHOL) 20 MG tablet Take 1 tablet (20 mg total) by mouth daily. 90 tablet 3   No current facility-administered medications on file prior to visit.  Past Surgical History:  Procedure Laterality Date  . ABDOMINAL HYSTERECTOMY  1990   and BSO; endometriosis  . ANTERIOR CERVICAL DECOMP/DISCECTOMY FUSION N/A 06/20/2013   Procedure: ANTERIOR CERVICAL DECOMPRESSION/DISCECTOMY FUSION 1 LEVEL;  Surgeon: Ophelia Charter, MD;  Location: [facility];  Service: Neurosurgery;  Laterality: N/A;  C45 anterior cervical decompression with fusion interbody prothesis plating and bonegraft  . BREAST REDUCTION SURGERY  1994  . CARPAL TUNNEL RELEASE Right 06/20/2013   Procedure: CARPAL TUNNEL RELEASE;  Surgeon: Ophelia Charter, MD;  Location: [facility];  Service: Neurosurgery;  Laterality: Right;  RIGHT carpal tunnel release  . COLONOSCOPY   03/06/12   sigmoid polyp; repeat 10 years; Dr. Tora Duck  . LUNG SURGERY  1987   endometriosis in lungs  . SUPRACLAVICAL NODE BIOPSY Left 01/14/2016   Procedure: LEFT SUPRACLAVICAL NODE BIOPSY;  Surgeon: Melrose Nakayama, MD;  Location: [facility];  Service: Thoracic;  Laterality: Left;    ROS as in subjective   Objective: BP 128/72   Pulse 96   Wt 154 lb 9.6 oz (70.1 kg)   LMP 12/27/1987   SpO2 98%   BMI 26.54 kg/m   Gen: wd, wn, nad Abdomen: nontender GU: mild erythema of urethra, otherwise no obvious discharge, vagina normal appearing, no lesions, s/p hysterectomy, no obvious rectal abnormality, no external hemorrhoid, no fissure, exam chaperoned by nurse   Assessment: Encounter Diagnoses  Name Primary?  . Leukopenia, unspecified type Yes  . Neutropenia, unspecified type ([place])   . Vaginal discharge   . Urine abnormality   . High risk medication use   . Adverse effect of drug, subsequent encounter   . Sarcoidosis ([place])   . Diabetes mellitus with complication ([facility])      Plan: Wet prep shows WBC, but no other abnormality.   Will send for urine culture.  C/t mild soap and water for female hygiene, avoid bubble baths and caffeine, c/t to eat a strict diabetic diet and work with endocrinologist to modify insulin dosing to get to goal.   May consider backing off Xigdeuo if urine culture +.     Leukopenia and neutropenia could be from the azathioprine.   Plan to recheck CBC with diff in 3-4 wk.  If worsening counts, will need to get hematology consult and/or modify therapy for sarcoidosis.     Bethany Webb was seen today for follow up  white blood counts low.  Diagnoses and all orders for this visit:  Leukopenia, unspecified type -     CBC with Differential/Platelet; Future  Neutropenia, unspecified type ([place]) -     CBC with Differential/Platelet; Future  Vaginal discharge -     Urine culture -     POCT Wet Prep [facility])  Urine abnormality -     Urinalysis  Dipstick -     Urine culture -     POCT Wet Prep [facility])  High risk medication use -     CBC with Differential/Platelet; Future  Adverse effect of drug, subsequent encounter -     CBC with Differential/Platelet; Future  Sarcoidosis ([place])  Diabetes mellitus with complication ([place])

## 2017-01-24 LAB — URINE CULTURE: Organism ID, Bacteria: NO GROWTH

## 2017-02-16 ENCOUNTER — Other Ambulatory Visit (INDEPENDENT_AMBULATORY_CARE_PROVIDER_SITE_OTHER): Payer: BLUE CROSS/BLUE SHIELD

## 2017-02-16 DIAGNOSIS — Z794 Long term (current) use of insulin: Secondary | ICD-10-CM

## 2017-02-16 DIAGNOSIS — E1165 Type 2 diabetes mellitus with hyperglycemia: Secondary | ICD-10-CM

## 2017-02-16 LAB — LIPID PANEL
CHOLESTEROL: 219 mg/dL — AB (ref 0–200)
HDL: 61.3 mg/dL (ref >39.00)
LDL Cholesterol: 149 mg/dL — ABNORMAL HIGH (ref 0–99)
NonHDL: 157.69
Total CHOL/HDL Ratio: 4
Triglycerides: 44 mg/dL (ref 0.0–149.0)
VLDL: 8.8 mg/dL (ref 0.0–40.0)

## 2017-02-16 LAB — COMPREHENSIVE METABOLIC PANEL
ALBUMIN: 4 g/dL (ref 3.5–5.2)
ALK PHOS: 198 U/L — AB (ref 39–117)
ALT: 32 U/L (ref 0–35)
AST: 36 U/L (ref 0–37)
BILIRUBIN TOTAL: 0.6 mg/dL (ref 0.2–1.2)
BUN: 16 mg/dL (ref 6–23)
CO2: 29 mEq/L (ref 19–32)
CREATININE: 0.8 mg/dL (ref 0.40–1.20)
Calcium: 9.4 mg/dL (ref 8.4–10.5)
Chloride: 104 mEq/L (ref 96–112)
GFR: 94.75 mL/min (ref >60.00)
Glucose, Bld: 90 mg/dL (ref 70–99)
POTASSIUM: 3.5 meq/L (ref 3.5–5.1)
SODIUM: 140 meq/L (ref 135–145)
TOTAL PROTEIN: 7.2 g/dL (ref 6.0–8.3)

## 2017-02-16 LAB — HEMOGLOBIN A1C: Hgb A1c MFr Bld: 6.5 % (ref 4.6–6.5)

## 2017-02-17 ENCOUNTER — Encounter: Payer: Self-pay | Admitting: Medical

## 2017-02-17 ENCOUNTER — Other Ambulatory Visit: Payer: Self-pay | Admitting: Medical

## 2017-02-17 NOTE — Telephone Encounter (Signed)
I see patient has an intolerance to metformin. Is this okay to refill?

## 2017-02-22 ENCOUNTER — Encounter: Payer: Self-pay | Admitting: Endocrinology

## 2017-02-22 ENCOUNTER — Ambulatory Visit (INDEPENDENT_AMBULATORY_CARE_PROVIDER_SITE_OTHER): Payer: BLUE CROSS/BLUE SHIELD | Admitting: Endocrinology

## 2017-02-22 VITALS — BP 128/76 | HR 89 | Ht 64.0 in | Wt 155.0 lb

## 2017-02-22 DIAGNOSIS — Z794 Long term (current) use of insulin: Secondary | ICD-10-CM | POA: Diagnosis not present

## 2017-02-22 DIAGNOSIS — E1165 Type 2 diabetes mellitus with hyperglycemia: Secondary | ICD-10-CM

## 2017-02-22 NOTE — Progress Notes (Signed)
Patient ID: Bethany Webb, female   DOB: 1961/11/30, 56 y.o.   MRN: [id number]           Reason for Appointment: Follow-up for Type 2 Diabetes  Referring physician: Tysinger   History of Present Illness:          Date of diagnosis of type 2 diabetes mellitus:        Background history:   She has been on various medications over the last few years and followed sporadically for her diabetes She was briefly taking metformin but larger doses she was having GI side effects Was on Xigduo last year but this was stopped when liver functions were normal in 1/16 and insulin started  Her [name] was stopped in December  Recent history:   INSULIN regimen is:  Novolog Mix:  10 units in the morning , 25 at lunch, 10-15 after supper      Non-insulin hypoglycemic drugs the patient is taking are: Xigduo 09/999 daily  Most recent A1c is 6.5, previously 7.1, fructosamine 272  Current management, blood sugar patterns and problems identified:  She has been trying to take her insulin before meals as directed and she thinks she is fairly compliant with doing it regularly  However she has persistently high readings after supper, highest 330  Her morning sugars are relatively stable and fairly close to normal  However she still takes 10 units of insulin in the morning even though she is not eating breakfast and sometimes does not eat lunch until 2 PM  No hypoglycemia but sugars have been as low as 80 midday  She has not member to find time for exercise again  She is trying to avoid regular soft drinks but does not consistent with diet, has not had any nutritional consultation       Side effects from medications have been: Diarrhea from regular metformin  Compliance with the medical regimen: Fair Hypoglycemia:   never  Glucose monitoring:  done 3 times a day         Glucometer:  contour       Blood Glucose readings by time of day from review of meter for the last 2 weeks:  Mean values apply  above for all meters except median for One Touch  PRE-MEAL Fasting Lunch Dinner Bedtime Overall  Glucose range:  80-235  146-330     Mean/median: 112  177  149  227 171      Self-care: The diet that the patient has been following is: tries to limit bread, sugar, fried food.     Meal times are:  Breakfast is Late morning or 2 pm, Dinner: 7-9 pm   Typical meal intake: Breakfast is egg/oatmeal.  Lunch will be chicken salad.  Has low fat meal at dinner.  Snacks: Nuts, walnuts, peanut butter  No regular soft drinks               Dietician visit, most recent:none               CDE: 9/17 Exercise: none   Weight history:  Wt Readings from Last 3 Encounters:  02/22/17 155 lb (70.3 kg)  01/23/17 154 lb 9.6 oz (70.1 kg)  01/10/17 156 lb (70.8 kg)    Glycemic control:   Lab Results  Component Value Date   HGBA1C 6.5 02/16/2017   HGBA1C 7.1 (H) 10/14/2016   HGBA1C 9.7 08/11/2016   Lab Results  Component Value Date   MICROALBUR <0.7 08/11/2016  LDLCALC 149 (H) 02/16/2017   CREATININE 0.80 02/16/2017   Lab Results  Component Value Date   MICRALBCREAT 1.4 08/11/2016   Lab on 02/16/2017  Component Date Value Ref Range Status  . Hgb A1c MFr Bld 02/16/2017 6.5  4.6 - 6.5 % Final  . Sodium 02/16/2017 140  135 - 145 mEq/L Final  . Potassium 02/16/2017 3.5  3.5 - 5.1 mEq/L Final  . Chloride 02/16/2017 104  96 - 112 mEq/L Final  . CO2 02/16/2017 29  19 - 32 mEq/L Final  . Glucose, Bld 02/16/2017 90  70 - 99 mg/dL Final  . BUN 02/16/2017 16  6 - 23 mg/dL Final  . Creatinine, Ser 02/16/2017 0.80  0.40 - 1.20 mg/dL Final  . Total Bilirubin 02/16/2017 0.6  0.2 - 1.2 mg/dL Final  . Alkaline Phosphatase 02/16/2017 198* 39 - 117 U/L Final  . AST 02/16/2017 36  0 - 37 U/L Final  . ALT 02/16/2017 32  0 - 35 U/L Final  . Total Protein 02/16/2017 7.2  6.0 - 8.3 g/dL Final  . Albumin 02/16/2017 4.0  3.5 - 5.2 g/dL Final  . Calcium 02/16/2017 9.4  8.4 - 10.5 mg/dL Final  . GFR 02/16/2017  94.75  >60.00 mL/min Final  . Cholesterol 02/16/2017 219* 0 - 200 mg/dL Final  . Triglycerides 02/16/2017 44.0  0.0 - 149.0 mg/dL Final  . HDL 02/16/2017 61.30  >39.00 mg/dL Final  . VLDL 02/16/2017 8.8  0.0 - 40.0 mg/dL Final  . LDL Cholesterol 02/16/2017 149* 0 - 99 mg/dL Final  . Total CHOL/HDL Ratio 02/16/2017 4   Final  . NonHDL 02/16/2017 157.69   Final       Allergies as of 02/22/2017      Reactions   Codeine Nausea Only   Metformin And Related    Diarrhea, nausea      Medication List       Accurate as of 02/22/17  9:55 AM. Always use your most recent med list.          amLODipine 10 MG tablet Commonly known as:  NORVASC TAKE 1 TABLET BY MOUTH EVERY DAY   azaTHIOprine 50 MG tablet Commonly known as:  IMURAN Take 50 mg by mouth daily.   blood glucose meter kit and supplies Kit Dispense based on patient and insurance preference. Use up to four times daily as directed. (FOR ICD-9 250.00, 250.01).   glucose blood test strip Test blood sugar twice daily. Pt uses one touch ultra   insulin aspart protamine - aspart (70-30) 100 UNIT/ML FlexPen Commonly known as:  NOVOLOG MIX 70/30 FLEXPEN Inject 0.5 mLs (50 Units total) into the skin 3 (three) times daily. INJECT 20 UNITS INTO THE SKIN TWICE DAILY WITH A MEAL   Lancets 30G Misc Test blood sugars twice daily   lisinopril-hydrochlorothiazide 20-25 MG tablet Commonly known as:  PRINZIDE,ZESTORETIC Take 1 tablet by mouth daily.   Pen Needles 32G X 4 MM Misc 1 each by Does not apply route daily.   pravastatin 20 MG tablet Commonly known as:  PRAVACHOL Take 1 tablet (20 mg total) by mouth daily.   UCERIS 9 MG Tb24 Generic drug:  Budesonide Take 1 tablet by mouth daily.   XIGDUO XR 09-999 MG Tb24 Generic drug:  Dapagliflozin-Metformin HCl ER Take 1 tablet by mouth daily. One a day       Allergies:  Allergies  Allergen Reactions  . Codeine Nausea Only  . Metformin And Related  Diarrhea, nausea     Past Medical History:  Diagnosis Date  . 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 Allergy   . Biceps tendonitis 2/12   Dr. Gladstone Lighter  . Diabetes mellitus    type II  . History of blood transfusion 1988   hysterectomy  . History of mammogram 05/25/12   stable, no suspicious finding, extremely dense breast tissue  . Hyperlipidemia   . Hypertension   . Neutropenia, unspecified 02/2013   slight, likely ethnicity related  . Pneumococcal vaccine refused 03/2013  . Refused influenza vaccine 03/2013  . S/P hysterectomy   . Sarcoidosis ([place])   . Shortness of breath dyspnea    with exertion   . Supraspinatus tendonitis 2/12   Dr. Gladstone Lighter  . Wears glasses     Past Surgical History:  Procedure Laterality Date  . ABDOMINAL HYSTERECTOMY  1990   and BSO; endometriosis  . ANTERIOR CERVICAL DECOMP/DISCECTOMY FUSION N/A 06/20/2013   Procedure: ANTERIOR CERVICAL DECOMPRESSION/DISCECTOMY FUSION 1 LEVEL;  Surgeon: Ophelia Charter, MD;  Location: [facility];  Service: Neurosurgery;  Laterality: N/A;  C45 anterior cervical decompression with fusion interbody prothesis plating and bonegraft  . BREAST REDUCTION SURGERY  1994  . CARPAL TUNNEL RELEASE Right 06/20/2013   Procedure: CARPAL TUNNEL RELEASE;  Surgeon: Ophelia Charter, MD;  Location: [facility];  Service: Neurosurgery;  Laterality: Right;  RIGHT carpal tunnel release  . COLONOSCOPY  03/06/12   sigmoid polyp; repeat 10 years; Dr. Tora Duck  . LUNG SURGERY  1987   endometriosis in lungs  . SUPRACLAVICAL NODE BIOPSY Left 01/14/2016   Procedure: LEFT SUPRACLAVICAL NODE BIOPSY;  Surgeon: Melrose Nakayama, MD;  Location: [facility];  Service: Thoracic;  Laterality: Left;    Family History  Problem Relation Age of Onset  . Heart disease Father 80    died of MI, CABG   . Multiple sclerosis Sister   . Asthma Brother   . Cancer Paternal Grandmother     breast   . Diabetes Other     maternal and paternal grandmother  . Cancer Maternal Uncle     hematologic  . Hypertension Maternal Grandmother   . Cancer Maternal Grandfather     prostate  . Hypertension Maternal Grandfather   . Asthma Brother   . Eczema Sister   . Allergies Sister   . Healthy Daughter     Social History:  reports that she has never smoked. She has never used smokeless tobacco. She reports that she drinks alcohol. She reports that she does not use drugs.   Review of Systems   Lipid history: On Pravastatin 20 from PCP, Recent LDL still high   Lab Results  Component Value Date   CHOL 219 (H) 02/16/2017   HDL 61.30 02/16/2017   LDLCALC 149 (H) 02/16/2017   TRIG 44.0 02/16/2017   CHOLHDL 4 02/16/2017       Lab Results  Component Value Date   ALT 32 02/16/2017   ALT 95 (H) 01/21/2016        Hypertension:Treated since 1997, Blood pressure Controlled Potassium is 3.5 Again   BP Readings from Last 3 Encounters:  02/22/17 128/76  01/23/17 128/72  01/10/17 130/68     Most recent eye exam was 3/17, reportedly normal  Most recent foot exam: 8/17    LABS:  Lab on 02/16/2017  Component Date Value Ref Range Status  .  Hgb A1c MFr Bld 02/16/2017 6.5  4.6 - 6.5 % Final  . Sodium 02/16/2017 140  135 - 145 mEq/L Final  . Potassium 02/16/2017 3.5  3.5 - 5.1 mEq/L Final  . Chloride 02/16/2017 104  96 - 112 mEq/L Final  . CO2 02/16/2017 29  19 - 32 mEq/L Final  . Glucose, Bld 02/16/2017 90  70 - 99 mg/dL Final  . BUN 02/16/2017 16  6 - 23 mg/dL Final  . Creatinine, Ser 02/16/2017 0.80  0.40 - 1.20 mg/dL Final  . Total Bilirubin 02/16/2017 0.6  0.2 - 1.2 mg/dL Final  . Alkaline Phosphatase 02/16/2017 198* 39 - 117 U/L Final  . AST 02/16/2017 36  0 - 37 U/L Final  . ALT 02/16/2017 32  0 - 35 U/L Final  . Total Protein 02/16/2017 7.2  6.0 - 8.3 g/dL Final  . Albumin 02/16/2017 4.0  3.5 - 5.2 g/dL Final  . Calcium 02/16/2017 9.4  8.4 - 10.5 mg/dL Final  . GFR  02/16/2017 94.75  >60.00 mL/min Final  . Cholesterol 02/16/2017 219* 0 - 200 mg/dL Final  . Triglycerides 02/16/2017 44.0  0.0 - 149.0 mg/dL Final  . HDL 02/16/2017 61.30  >39.00 mg/dL Final  . VLDL 02/16/2017 8.8  0.0 - 40.0 mg/dL Final  . LDL Cholesterol 02/16/2017 149* 0 - 99 mg/dL Final  . Total CHOL/HDL Ratio 02/16/2017 4   Final  . NonHDL 02/16/2017 157.69   Final    Physical Examination:  BP 128/76   Pulse 89   Ht '5\' 4"'  (1.626 m)   Wt 155 lb (70.3 kg)   LMP 12/27/1987   SpO2 98%   BMI 26.61 kg/m           ASSESSMENT:  Diabetes type 2, uncontrolled    See history of present illness for detailed discussion of current diabetes management, blood sugar patterns and problems identified  Although herA1c is 6.5 she is having consistently high readings after supper She is taking premixed insulin and since fasting readings are fairly good she cannot increase the suppertime dose Blood sugars are relatively better in the afternoon although not checking much after her lunch meal which is at variable times  Because of her variable schedule and inconsistent level of control she should do better with basal bolus insulin regimen She also has a relatively busy schedule and does not always find it convenient to take insulin before her meals especially at lunchtime She can do better with diet and probably needs more nutritional counseling  HYPERTENSION: Appears  controlled now  Potassium is low normal Again  Hyperlipidemia: Needs to be addressed by PCP  PLAN:    She will be instructed on the V-go pump I PCP.  Discussed in detail how this works  Follow-up after starting the pump, she will use a 20 unit basal and most likely will need 6 clicks with each meal to start with    There are no Patient Instructions on file for this visit.    Shyloh Derosa 02/22/2017, 9:55 AM   Note: This office note was prepared with Dragon voice recognition system technology. Any transcriptional  errors that result from this process are unintentional.

## 2017-02-27 ENCOUNTER — Other Ambulatory Visit: Payer: Self-pay

## 2017-02-27 ENCOUNTER — Encounter: Payer: BLUE CROSS/BLUE SHIELD | Admitting: Nutrition

## 2017-02-27 MED ORDER — DAPAGLIFLOZIN PRO-METFORMIN ER 10-1000 MG PO TB24: 1.0000 | ORAL_TABLET | Freq: Every day | ORAL | 3 refills | Status: DC

## 2017-03-03 ENCOUNTER — Ambulatory Visit: Payer: BLUE CROSS/BLUE SHIELD | Admitting: Endocrinology

## 2017-03-07 ENCOUNTER — Encounter: Payer: BLUE CROSS/BLUE SHIELD | Admitting: Nutrition

## 2017-03-09 ENCOUNTER — Ambulatory Visit: Payer: BLUE CROSS/BLUE SHIELD | Admitting: Endocrinology

## 2017-03-15 ENCOUNTER — Ambulatory Visit: Payer: BLUE CROSS/BLUE SHIELD | Admitting: Endocrinology

## 2017-03-19 ENCOUNTER — Other Ambulatory Visit: Payer: Self-pay | Admitting: Medical

## 2017-03-26 ENCOUNTER — Other Ambulatory Visit: Payer: Self-pay | Admitting: Medical

## 2017-04-10 ENCOUNTER — Other Ambulatory Visit: Payer: Self-pay | Admitting: Medical

## 2017-04-11 ENCOUNTER — Telehealth: Payer: Self-pay

## 2017-04-11 ENCOUNTER — Other Ambulatory Visit: Payer: Self-pay | Admitting: Medical

## 2017-04-11 MED ORDER — TRAMADOL HCL 50 MG PO TABS: 50.0000 mg | ORAL_TABLET | Freq: Four times a day (QID) | ORAL | 0 refills | Status: DC | PRN

## 2017-04-11 NOTE — Telephone Encounter (Signed)
yes

## 2017-04-11 NOTE — Telephone Encounter (Signed)
Fax request for refill of Tramadol 50mg  to [facility]. Bethany Webb

## 2017-04-11 NOTE — Telephone Encounter (Signed)
Could tarsha call this in for the pt?

## 2017-04-11 NOTE — Telephone Encounter (Signed)
Called in pt  rx to [facility]

## 2017-04-11 NOTE — Telephone Encounter (Signed)
rx ready 

## 2017-04-28 ENCOUNTER — Ambulatory Visit (INDEPENDENT_AMBULATORY_CARE_PROVIDER_SITE_OTHER): Payer: BLUE CROSS/BLUE SHIELD | Admitting: Medical

## 2017-04-28 VITALS — BP 128/88 | HR 106 | Wt 150.8 lb

## 2017-04-28 DIAGNOSIS — E785 Hyperlipidemia, unspecified: Secondary | ICD-10-CM

## 2017-04-28 DIAGNOSIS — E1169 Type 2 diabetes mellitus with other specified complication: Secondary | ICD-10-CM | POA: Diagnosis not present

## 2017-04-28 DIAGNOSIS — I10 Essential (primary) hypertension: Secondary | ICD-10-CM | POA: Diagnosis not present

## 2017-04-28 DIAGNOSIS — D869 Sarcoidosis, unspecified: Secondary | ICD-10-CM | POA: Diagnosis not present

## 2017-04-28 DIAGNOSIS — E118 Type 2 diabetes mellitus with unspecified complications: Secondary | ICD-10-CM

## 2017-04-28 DIAGNOSIS — B379 Candidiasis, unspecified: Secondary | ICD-10-CM

## 2017-04-28 LAB — POCT WET PREP (WET MOUNT)

## 2017-04-28 MED ORDER — FLUCONAZOLE 150 MG PO TABS: ORAL_TABLET | ORAL | 0 refills | Status: DC

## 2017-04-28 NOTE — Progress Notes (Signed)
Subjective: Chief Complaint  Patient presents with  . yeast infection    yeast infection , icthing, and burning    Here for possible yeast infection, with vaginal itching, burning, slight discharge.  Slight swelling.  No sexual partners or sexual activity in a long time.   Does take Xigduo.   Sarcoidosis - in a clinical trial, on medications daily, not having so much pain at this time.  Saw endocrinology recently had labs, and she doesn't think she is taking the Pravachol.    Is eating healthy.   Getting some exercise  compliant with BP medications   Past Medical History:  Diagnosis Date  . 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 Allergy   . Biceps tendonitis 2/12   Dr. Gladstone Lighter  . Diabetes mellitus    type II  . History of blood transfusion 1988   hysterectomy  . History of mammogram 05/25/12   stable, no suspicious finding, extremely dense breast tissue  . Hyperlipidemia   . Hypertension   . Neutropenia, unspecified 02/2013   slight, likely ethnicity related  . Pneumococcal vaccine refused 03/2013  . Refused influenza vaccine 03/2013  . S/P hysterectomy   . Sarcoidosis ([place])   . Shortness of breath dyspnea    with exertion   . Supraspinatus tendonitis 2/12   Dr. Gladstone Lighter  . Wears glasses    Current Outpatient Prescriptions on File Prior to Visit  Medication Sig Dispense Refill  . amLODipine (NORVASC) 10 MG tablet TAKE 1 TABLET BY MOUTH EVERY DAY 90 tablet 0  . azaTHIOprine (IMURAN) 50 MG tablet Take 50 mg by mouth daily.    . blood glucose meter kit and supplies KIT Dispense based on patient and insurance preference. Use up to four times daily as directed. (FOR ICD-9 250.00, 250.01). 1 each 0  . Dapagliflozin-Metformin HCl ER (XIGDUO XR) 09-999 MG TB24 Take 1 tablet by mouth daily. One a day 30 tablet 3  . glucose blood test strip Test blood sugar twice daily. Pt uses one touch ultra 100 each 11   . insulin aspart protamine - aspart (NOVOLOG MIX 70/30 FLEXPEN) (70-30) 100 UNIT/ML FlexPen Inject 0.5 mLs (50 Units total) into the skin 3 (three) times daily. INJECT 20 UNITS INTO THE SKIN TWICE DAILY WITH A MEAL (Patient taking differently: Inject 25 Units into the skin daily with lunch. 10 units in the morning if levels are high 25 units at lunch and 10 units at supper) 100 mL 3  . Insulin Pen Needle (PEN NEEDLES) 32G X 4 MM MISC 1 each by Does not apply route daily. 200 each 1  . Lancets 30G MISC Test blood sugars twice daily 100 each 2  . lisinopril-hydrochlorothiazide (PRINZIDE,ZESTORETIC) 20-25 MG tablet TAKE 1 TABLET BY MOUTH EVERY DAY 90 tablet 3  . pravastatin (PRAVACHOL) 20 MG tablet Take 1 tablet (20 mg total) by mouth daily. 90 tablet 3  . traMADol (ULTRAM) 50 MG tablet Take 1-2 tablets (50-100 mg total) by mouth every 6 (six) hours as needed (pain). 30 tablet 0  . Budesonide (UCERIS) 9 MG TB24 Take 1 tablet by mouth daily.     No current facility-administered medications on file prior to visit.    ROS as in subjective  Objective: BP 128/88   Pulse (!) 106   Wt 150 lb 12.8 oz (68.4 kg)   LMP 12/27/1987   SpO2 96%   BMI 25.88 kg/m  Gen: wd, wn, nad    Assessment: Encounter Diagnoses  Name Primary?  . Yeast infection Yes  . Essential hypertension, benign   . Diabetes mellitus with complication ([place])   . Hyperlipidemia associated with type 2 diabetes mellitus ([place])   . Sarcoidosis     Plan:  Yeast infection: +yeast on wet prep, begin diflucan HTN - compliant with medication Diabetes - reviewed Dr. Ronnie Derby recent visit notes and labs hyperlipidemia - she hasn't been taking Pravachol.  revived recent lab, restart medication Pravachol Sarcoidosis - in drug study currently  Lemma was seen today for yeast infection.  Diagnoses and all orders for this visit:  Yeast infection -     POCT Wet Prep [facility])  Essential hypertension, benign  Diabetes mellitus  with complication ([place])  Hyperlipidemia associated with type 2 diabetes mellitus ([place])  Sarcoidosis  Other orders -     fluconazole (DIFLUCAN) 150 MG tablet; 1 tablet today and can repeat in 1 week if needed

## 2017-04-28 NOTE — Patient Instructions (Signed)
Recommendations:  Begin diflucan for years infection, and you can repeat this in a week if needed.   Please let the study know this medication is being added  Check to see at home if you are actually taking the Pravachol nightly for cholesterol.  If not, restart Pravachol 20mg  every night to lower cholesterol

## 2017-05-17 ENCOUNTER — Other Ambulatory Visit: Payer: Self-pay | Admitting: Medical

## 2017-05-30 ENCOUNTER — Telehealth: Payer: Self-pay | Admitting: Medical

## 2017-05-30 MED ORDER — FLUCONAZOLE 150 MG PO TABS: ORAL_TABLET | ORAL | 0 refills | Status: DC

## 2017-05-30 NOTE — Telephone Encounter (Signed)
Per Audelia Acton- ok to renew fluconazole. Called to [facility] on [address].

## 2017-05-30 NOTE — Telephone Encounter (Signed)
Pt has yeast infection again, states gets it from medications. States it did go away after the [facility] but it'sb ack now and would like a refill.

## 2017-06-19 LAB — HM COLONOSCOPY

## 2017-06-20 IMAGING — CT NM PET TUM IMG INITIAL (PI) SKULL BASE T - THIGH
1 of 8 series · 1 of 25 positions shown · non-contrast
Comparison: CT chest 01/04/2016 and CT abdomen/pelvis 12/17/2015

CLINICAL DATA: Initial treatment strategy for mediastinal and hilar
lymphadenopathy in the chest.

EXAM:
NUCLEAR MEDICINE PET SKULL BASE TO THIGH
TECHNIQUE: 7.11 mCi F-18 FDG was injected intravenously. Full-ring PET imaging
was performed from the skull base to thigh after the radiotracer. CT
data was obtained and used for attenuation correction and anatomic
localization.
FASTING BLOOD GLUCOSE:  Value: 145 mg/dl

[Series 3: pet [id number] ac · axial · 5.0mm · 4.07mm/px · 1 of 206 slices shown]
[im 103/206]
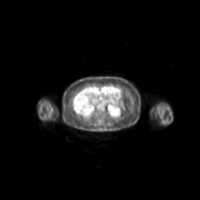

[1 of 25 positions shown; findings below may reference images not displayed]

FINDINGS: NECK

Diffuse metabolic activity is noted in the right thyroid lobe and
isthmus. This is most likely thyroiditis. A small lesion is noted in
the right lobe on the CT scan but did not relate correlate with this
activity. Thyroid ultrasound followup is suggested.

There are small but metabolically active right-sided supraclavicular
and subpectoral lymph nodes. The largest lymph node in the right
retro clavicular area measures 9.5 mm and the SUV max is 10.4.

Surgical changes noted in the left supraclavicular area from recent
biopsy. There is a small amount of fluid and residual air in this
area

CHEST

Bulky diffuse mediastinal and hilar lymphadenopathy is markedly
hypermetabolic ranging from 13-28 SUV. There is also internal
mammary adenopathy on the right side. Small right-sided paraspinal
nodules are also metabolically active.

8 mm right upper lobe pulmonary nodule is reactive with SUV max of
5.3.

6.5 mm subpleural right upper lobe pulmonary nodule has an SUV max
of 5.5.

Calcified pleural plaques are again demonstrated. No
hypermetabolism.

ABDOMEN/PELVIS

No all abdominal/pelvic lymphadenopathy. The solid abdominal organs
are unremarkable. No bowel lesions. No inguinal lymphadenopathy.

SKELETON

No focal hypermetabolic activity to suggest skeletal metastasis.
IMPRESSION: 1. Hypermetabolic lymphadenopathy in the lower neck and chest along
with small metabolically active pulmonary nodules. Findings most
likely secondary to sarcoidosis. Small cell lung cancer and lymphoma
are other possibilities but based on the biopsy results are
unlikely.
2. Diffuse uptake in the right thyroid lobe and isthmus most likely
due to thyroiditis. Recommend follow-up thyroid ultrasound
examination.
3. Postoperative changes in the left supraclavicular area with some
residual fluid and air. Would not expect this much air 6 days after
surgery. Recommend clinical correlation.

## 2017-06-22 ENCOUNTER — Encounter: Payer: Self-pay | Admitting: Family Medicine

## 2017-06-26 ENCOUNTER — Ambulatory Visit (INDEPENDENT_AMBULATORY_CARE_PROVIDER_SITE_OTHER): Payer: Worker's Compensation | Admitting: Orthopaedic Surgery

## 2017-06-26 ENCOUNTER — Encounter (INDEPENDENT_AMBULATORY_CARE_PROVIDER_SITE_OTHER): Payer: Self-pay | Admitting: Orthopaedic Surgery

## 2017-06-26 ENCOUNTER — Ambulatory Visit (INDEPENDENT_AMBULATORY_CARE_PROVIDER_SITE_OTHER): Payer: Self-pay

## 2017-06-26 DIAGNOSIS — G8929 Other chronic pain: Secondary | ICD-10-CM

## 2017-06-26 DIAGNOSIS — M25511 Pain in right shoulder: Secondary | ICD-10-CM | POA: Diagnosis not present

## 2017-06-26 NOTE — Progress Notes (Signed)
Office Visit Note   Patient: Bethany Webb           Date of Birth: Aug 06, 1961           MRN: [id number] Visit Date: 06/26/2017              Requested by: Denita Lung, MD [place], [place] [address] PCP: Denita Lung, MD   Assessment & Plan: Visit Diagnoses:  1. Chronic right shoulder pain     Plan: MRI right shoulder to rule out structural abnormalities given failure of conservative treatment. Follow up after the MRI.  Follow-Up Instructions: Return if symptoms worsen or fail to improve.   Orders:  Orders Placed This Encounter  Procedures  . XR Shoulder Right  . MR SHOULDER RIGHT WO CONTRAST   No orders of the defined types were placed in this encounter.     Procedures: No procedures performed   Clinical Data: No additional findings.   Subjective: Chief Complaint  Patient presents with  . Right Shoulder - Injury, Pain    Bethany Webb is a 56 year old female with right shoulder pain chronically since early April. 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 She immediately felt pain and she denies any radiation of pain. She is done physical therapy during this time. She comes in today for further evaluation and treatment. She denies any radicular symptoms. Denies any numbness or tingling.    Review of Systems  Constitutional: Negative.   HENT: Negative.   Eyes: Negative.   Respiratory: Negative.   Cardiovascular: Negative.   Endocrine: Negative.   Musculoskeletal: Negative.   Neurological: Negative.   Hematological: Negative.   Psychiatric/Behavioral: Negative.   All other systems reviewed and are negative.    Objective: Vital Signs: LMP 12/27/1987   Physical Exam  Constitutional: She is oriented to person, place, and time. She appears well-developed and well-nourished.  HENT:  Head: Normocephalic and atraumatic.  Eyes: EOM are normal.  Neck: Neck  supple.  Pulmonary/Chest: Effort normal.  Abdominal: Soft.  Neurological: She is alert and oriented to person, place, and time.  Skin: Skin is warm. Capillary refill takes less than 2 seconds.  Psychiatric: She has a normal mood and affect. Her behavior is normal. Judgment and thought content normal.  Nursing note and vitals reviewed.   Ortho Exam Right shoulder exam shows painful rotator cuff function. She has no deficits. Positive impingement signs. Negative cross adduction. Acromioclavicular joint is not significantly tender. Bicipital groove is slightly tender. Negative apprehension. Specialty Comments:  No specialty comments available.  Imaging: Xr Shoulder Right  Result Date: 06/26/2017 No acute or structural abnormalities.    PMFS History: Patient Active Problem List   Diagnosis Date Noted  . Nausea 09/15/2016  . Abdominal pain, epigastric 09/15/2016  . RUQ pain 09/15/2016  . 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 Sarcoidosis 01/21/2016  . 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 Abnormal liver function 01/21/2016  . Elevated liver enzymes 01/05/2016  . Abnormal chest CT 01/05/2016  . Lymphadenopathy, mediastinal 01/05/2016  . Pleural plaque 01/05/2016  . Pulmonary nodule 01/05/2016  . Anorexia 01/05/2016  . Loss of weight 01/05/2016  . Night sweats 01/05/2016  . Allergic conjunctivitis 10/18/2011  . Allergic rhinitis 10/18/2011  . Diabetes mellitus with complication ([place]) 40/09/2724  . Hyperlipidemia associated with type 2 diabetes mellitus ([place]) 10/06/2011  .  Essential hypertension, benign 10/06/2011   Past Medical History:  Diagnosis Date  . 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 Allergy   . Biceps tendonitis 2/12   Dr. Gladstone Lighter  . Diabetes mellitus    type II  . History of blood transfusion 1988   hysterectomy  . History of  mammogram 05/25/12   stable, no suspicious finding, extremely dense breast tissue  . Hyperlipidemia   . Hypertension   . Neutropenia, unspecified ([place]) 02/2013   slight, likely ethnicity related  . Pneumococcal vaccine refused 03/2013  . Refused influenza vaccine 03/2013  . S/P hysterectomy   . Sarcoidosis   . Shortness of breath dyspnea    with exertion   . Supraspinatus tendonitis 2/12   Dr. Gladstone Lighter  . Wears glasses     Family History  Problem Relation Age of Onset  . Heart disease Father 4       died of MI, CABG   . Multiple sclerosis Sister   . Asthma Brother   . Cancer Paternal Grandmother        breast  . Diabetes Other        maternal and paternal grandmother  . Cancer Maternal Uncle        hematologic  . Hypertension Maternal Grandmother   . Cancer Maternal Grandfather        prostate  . Hypertension Maternal Grandfather   . Asthma Brother   . Eczema Sister   . Allergies Sister   . Healthy Daughter     Past Surgical History:  Procedure Laterality Date  . ABDOMINAL HYSTERECTOMY  1990   and BSO; endometriosis  . ANTERIOR CERVICAL DECOMP/DISCECTOMY FUSION N/A 06/20/2013   Procedure: ANTERIOR CERVICAL DECOMPRESSION/DISCECTOMY FUSION 1 LEVEL;  Surgeon: Ophelia Charter, MD;  Location: [facility];  Service: Neurosurgery;  Laterality: N/A;  C45 anterior cervical decompression with fusion interbody prothesis plating and bonegraft  . BREAST REDUCTION SURGERY  1994  . CARPAL TUNNEL RELEASE Right 06/20/2013   Procedure: CARPAL TUNNEL RELEASE;  Surgeon: Ophelia Charter, MD;  Location: [facility];  Service: Neurosurgery;  Laterality: Right;  RIGHT carpal tunnel release  . COLONOSCOPY  03/06/12   sigmoid polyp; repeat 10 years; Dr. Tora Duck  . LUNG SURGERY  1987   endometriosis in lungs  . SUPRACLAVICAL NODE BIOPSY Left 01/14/2016   Procedure: LEFT SUPRACLAVICAL NODE BIOPSY;  Surgeon: Melrose Nakayama, MD;  Location: [facility];  Service: Thoracic;  Laterality: Left;     Social History   Occupational History  . Midwife    Social History Main Topics  . Smoking status: Never Smoker  . Smokeless tobacco: Never Used  . Alcohol use 0.0 oz/week     Comment: rare wine, maybe 2-3 times years  . Drug use: No  . Sexual activity: No

## 2017-07-09 ENCOUNTER — Other Ambulatory Visit: Payer: Self-pay | Admitting: Endocrinology

## 2017-07-14 NOTE — Telephone Encounter (Signed)
error 

## 2017-07-21 ENCOUNTER — Encounter: Payer: Self-pay | Admitting: Medical

## 2017-07-21 ENCOUNTER — Ambulatory Visit (INDEPENDENT_AMBULATORY_CARE_PROVIDER_SITE_OTHER): Payer: BLUE CROSS/BLUE SHIELD | Admitting: Medical

## 2017-07-21 VITALS — BP 130/80 | HR 80 | Resp 16 | Wt 147.2 lb

## 2017-07-21 DIAGNOSIS — D869 Sarcoidosis, unspecified: Secondary | ICD-10-CM

## 2017-07-21 DIAGNOSIS — E118 Type 2 diabetes mellitus with unspecified complications: Secondary | ICD-10-CM

## 2017-07-21 DIAGNOSIS — B3731 Acute candidiasis of vulva and vagina: Secondary | ICD-10-CM

## 2017-07-21 DIAGNOSIS — B373 Candidiasis of vulva and vagina: Secondary | ICD-10-CM | POA: Diagnosis not present

## 2017-07-21 LAB — POCT URINALYSIS DIP (PROADVANTAGE DEVICE)
Bilirubin, UA: NEGATIVE
Glucose, UA: NEGATIVE mg/dL
Ketones, POC UA: NEGATIVE mg/dL
Leukocytes, UA: NEGATIVE
NITRITE UA: NEGATIVE
PH UA: 6 (ref 5.0–8.0)
RBC UA: NEGATIVE
Specific Gravity, Urine: 1.03
UUROB: NEGATIVE

## 2017-07-21 LAB — POCT SKIN KOH: SKIN KOH, POC: POSITIVE — AB

## 2017-07-21 MED ORDER — NYSTATIN 100000 UNIT/GM EX CREA: 1.0000 "application " | TOPICAL_CREAM | Freq: Two times a day (BID) | CUTANEOUS | 0 refills | Status: DC

## 2017-07-21 NOTE — Progress Notes (Signed)
Subjective: Chief Complaint  Patient presents with  . Vaginitis    pt states she has been having vaginal itching and burning for 1 yr and started clinical trial for sarcodosis for 24 weeks. She states she will take the diflucan and then she is ok for 2-3 weeks and sx return. Wonders if sx related to medications. needs refill of Xigduo    Here for burning and itching on outside of vagina.   No redness, no discharge.  No urinary symptoms, no frequency, no urgency, no bleeding.  Doesn't think there is a rash.  Worried about yeast.   I saw her in May for yeast vaginitis ,and she has called back for refill on diflucan 2 other times since then for same symptoms .   She is still taking Xigduo.   No new sexual partners.   She is in clinical trial for sarcoidosis.  No other aggravating or relieving factors. No other complaint.  Diabetes - using Novolog mix 70/30, typically 10u in the morning, 14-20 lunch, 10-15 u.  Takes Xigduo regularly.   Eating healthy.  Lately has more appetite, but had been out of Xigduo 2 weeks.  xigduo really helps her appetite cravings.  Past Medical History:  Diagnosis Date  . 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 Allergy   . Biceps tendonitis 2/12   Dr. Gladstone Lighter  . Diabetes mellitus    type II  . History of blood transfusion 1988   hysterectomy  . History of mammogram 05/25/12   stable, no suspicious finding, extremely dense breast tissue  . Hyperlipidemia   . Hypertension   . Neutropenia, unspecified ([place]) 02/2013   slight, likely ethnicity related  . Pneumococcal vaccine refused 03/2013  . Refused influenza vaccine 03/2013  . S/P hysterectomy   . Sarcoidosis   . Shortness of breath dyspnea    with exertion   . Supraspinatus tendonitis 2/12   Dr. Gladstone Lighter  . Wears glasses    Current Outpatient Prescriptions on File Prior to Visit  Medication Sig Dispense Refill  . amLODipine (NORVASC)  10 MG tablet TAKE 1 TABLET BY MOUTH EVERY DAY 90 tablet 3  . azaTHIOprine (IMURAN) 50 MG tablet Take 50 mg by mouth daily.    . blood glucose meter kit and supplies KIT Dispense based on patient and insurance preference. Use up to four times daily as directed. (FOR ICD-9 250.00, 250.01). 1 each 0  . Budesonide (UCERIS) 9 MG TB24 Take 1 tablet by mouth daily.    . Dapagliflozin-Metformin HCl ER (XIGDUO XR) 09-999 MG TB24 Take 1 tablet by mouth daily. One a day 30 tablet 3  . glucose blood test strip Test blood sugar twice daily. Pt uses one touch ultra 100 each 11  . insulin aspart protamine - aspart (NOVOLOG MIX 70/30 FLEXPEN) (70-30) 100 UNIT/ML FlexPen Inject 0.5 mLs (50 Units total) into the skin 3 (three) times daily. INJECT 20 UNITS INTO THE SKIN TWICE DAILY WITH A MEAL (Patient taking differently: Inject 25 Units into the skin daily with lunch. 10 units in the morning if levels are high 25 units at lunch and 10 units at supper) 100 mL 3  . Insulin Pen Needle (PEN NEEDLES) 32G X 4 MM MISC 1 each by Does not apply route daily. 200 each 1  . Lancets 30G MISC Test blood sugars twice daily 100 each 2  . lisinopril-hydrochlorothiazide (PRINZIDE,ZESTORETIC) 20-25 MG tablet TAKE  1 TABLET BY MOUTH EVERY DAY 90 tablet 3  . pravastatin (PRAVACHOL) 20 MG tablet Take 1 tablet (20 mg total) by mouth daily. 90 tablet 3  . traMADol (ULTRAM) 50 MG tablet Take 1-2 tablets (50-100 mg total) by mouth every 6 (six) hours as needed (pain). 30 tablet 0   No current facility-administered medications on file prior to visit.    ROS as in subjective    Objective: BP 130/80   Pulse 80   Resp 16   Wt 147 lb 3.2 oz (66.8 kg)   LMP 12/27/1987   SpO2 98%   BMI 25.27 kg/m   Gen: wd, wn, nad Gyn: around clitoris and vulvar folds bilat is erythema and whitish discharge, tender with swabs, otherwise  vagina with normal mucosa, no other abnormal vaginal discharge.  Exam chaperoned by nurse.  KOH prep + for yeast  and hyphae   Assessment: Encounter Diagnoses  Name Primary?  . Vulvovaginitis due to yeast Yes  . Diabetes mellitus with complication ([place])   . Sarcoidosis      Plan: discussed her symptoms, and concerns.  Begin nystatin cream for yeast vulvovaginits  Advised she stop Xigduo as it likely is aggravating the yeast issues.   SLG2 medications are known to increase risk of genital fungal infections.   Not sure if the sarcoid is playing a role as well.  I advised she notify her drug study team and rheumatologist about the visit today.  I have emailed Dr. Dwyane Dee to get his recommendations as replacement for Xigduo or other diabetes medication modifications and also to consider appetite suppressant.    If not improving or worse in the next week then call back or recheck.      Bethany Webb was seen today for vaginitis.  Diagnoses and all orders for this visit:  Vulvovaginitis due to yeast  Diabetes mellitus with complication ([place])  Sarcoidosis  Other orders -     nystatin cream (MYCOSTATIN); Apply 1 application topically 2 (two) times daily.

## 2017-07-21 NOTE — Addendum Note (Signed)
Addended by: Carlena Hurl on: 07/21/2017 12:27 PM   Modules accepted: Orders

## 2017-07-28 ENCOUNTER — Other Ambulatory Visit: Payer: Self-pay | Admitting: Medical

## 2017-07-28 ENCOUNTER — Emergency Department (HOSPITAL_COMMUNITY)
Admission: EM | Admit: 2017-07-28 | Discharge: 2017-07-29 | Disposition: A | Discharge status: Home / Self Care | Payer: BLUE CROSS/BLUE SHIELD | Attending: Emergency Medicine | Admitting: Emergency Medicine

## 2017-07-28 ENCOUNTER — Encounter (HOSPITAL_COMMUNITY): Payer: Self-pay | Admitting: Emergency Medicine

## 2017-07-28 DIAGNOSIS — E119 Type 2 diabetes mellitus without complications: Secondary | ICD-10-CM | POA: Diagnosis not present

## 2017-07-28 DIAGNOSIS — I10 Essential (primary) hypertension: Secondary | ICD-10-CM | POA: Insufficient documentation

## 2017-07-28 DIAGNOSIS — R251 Tremor, unspecified: Secondary | ICD-10-CM | POA: Diagnosis present

## 2017-07-28 DIAGNOSIS — Z794 Long term (current) use of insulin: Secondary | ICD-10-CM | POA: Diagnosis not present

## 2017-07-28 DIAGNOSIS — Z79899 Other long term (current) drug therapy: Secondary | ICD-10-CM | POA: Insufficient documentation

## 2017-07-28 LAB — URINALYSIS, ROUTINE W REFLEX MICROSCOPIC
BILIRUBIN URINE: NEGATIVE
GLUCOSE, UA: 50 mg/dL — AB
HGB URINE DIPSTICK: NEGATIVE
KETONES UR: NEGATIVE mg/dL
Leukocytes, UA: NEGATIVE
Nitrite: NEGATIVE
PH: 7 (ref 5.0–8.0)
Protein, ur: NEGATIVE mg/dL
Specific Gravity, Urine: 1.008 (ref 1.005–1.030)

## 2017-07-28 LAB — CBC WITH DIFFERENTIAL/PLATELET
BASOS ABS: 0 10*3/uL (ref 0.0–0.1)
BASOS PCT: 0 %
EOS ABS: 0 10*3/uL (ref 0.0–0.7)
Eosinophils Relative: 0 %
HCT: 34.6 % — ABNORMAL LOW (ref 36.0–46.0)
HEMOGLOBIN: 11.8 g/dL — AB (ref 12.0–15.0)
Lymphocytes Relative: 24 %
Lymphs Abs: 0.7 10*3/uL (ref 0.7–4.0)
MCH: 30.1 pg (ref 26.0–34.0)
MCHC: 34.1 g/dL (ref 30.0–36.0)
MCV: 88.3 fL (ref 78.0–100.0)
Monocytes Absolute: 0.5 10*3/uL (ref 0.1–1.0)
Monocytes Relative: 17 %
NEUTROS PCT: 59 %
Neutro Abs: 1.6 10*3/uL — ABNORMAL LOW (ref 1.7–7.7)
PLATELETS: 321 10*3/uL (ref 150–400)
RBC: 3.92 MIL/uL (ref 3.87–5.11)
RDW: 16.3 % — ABNORMAL HIGH (ref 11.5–15.5)
WBC: 2.8 10*3/uL — ABNORMAL LOW (ref 4.0–10.5)

## 2017-07-28 MED ORDER — METFORMIN HCL ER 500 MG PO TB24: 1000.0000 mg | ORAL_TABLET | Freq: Two times a day (BID) | ORAL | 0 refills | Status: DC

## 2017-07-28 NOTE — ED Triage Notes (Signed)
Pt presents to ED for sudden onset of whole body tremors while trying to eat dinner about 45 minutes ago. Pt has sarcoidosis and is worried about it being related. Patient experiencing uncontrollable shaking and jerking movements while in chair in triage. Denies other symptoms.

## 2017-07-28 NOTE — ED Notes (Signed)
Pt states phleb Helene Kelp just collected labs.  I will call main lab and verify blood was sent.

## 2017-07-29 LAB — BASIC METABOLIC PANEL WITH GFR
Anion gap: 9 (ref 5–15)
BUN: 12 mg/dL (ref 6–20)
CO2: 27 mmol/L (ref 22–32)
Calcium: 9.1 mg/dL (ref 8.9–10.3)
Chloride: 102 mmol/L (ref 101–111)
Creatinine, Ser: 0.78 mg/dL (ref 0.44–1.00)
GFR calc Af Amer: >60 mL/min
GFR calc non Af Amer: >60 mL/min
Glucose, Bld: 226 mg/dL — ABNORMAL HIGH (ref 65–99)
Potassium: 3.4 mmol/L — ABNORMAL LOW (ref 3.5–5.1)
Sodium: 138 mmol/L (ref 135–145)

## 2017-07-29 NOTE — ED Provider Notes (Signed)
[facility] DEPT Provider Note   CSN: [id number] Arrival date & time: 07/28/17  2118     History   Chief Complaint Chief Complaint  Patient presents with  . Tremors     HPI Bethany Webb is a 56 y.o. Female With history of DM, a [place], HTN, neutropenia, sarcoidosis who presents with diffuse body tremors. Patient reports that she was at a restaurant when she developed sudden onset of bilateral upper extremity shaking and inability to walk. She denies any recent trauma, hitting her head. She denies any recent illness. She denies previous movement disorder. She is concerned she may have a sarcoid tremor. No recent changes in medications.    Past Medical History:  Diagnosis Date  . 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 Allergy   . Biceps tendonitis 2/12   Dr. Gladstone Lighter  . Diabetes mellitus    type II  . History of blood transfusion 1988   hysterectomy  . History of mammogram 05/25/12   stable, no suspicious finding, extremely dense breast tissue  . Hyperlipidemia   . Hypertension   . Neutropenia, unspecified ([place]) 02/2013   slight, likely ethnicity related  . Pneumococcal vaccine refused 03/2013  . Refused influenza vaccine 03/2013  . S/P hysterectomy   . Sarcoidosis   . Shortness of breath dyspnea    with exertion   . Supraspinatus tendonitis 2/12   Dr. Gladstone Lighter  . Wears glasses     Patient Active Problem List   Diagnosis Date Noted  . Nausea 09/15/2016  . Abdominal pain, epigastric 09/15/2016  . RUQ pain 09/15/2016  . 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 Sarcoidosis 01/21/2016  . 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 Abnormal liver function 01/21/2016  . Elevated liver enzymes 01/05/2016  . Abnormal chest CT 01/05/2016  . Lymphadenopathy, mediastinal 01/05/2016  . Pleural plaque 01/05/2016  . Pulmonary nodule 01/05/2016  . Anorexia  01/05/2016  . Loss of weight 01/05/2016  . Night sweats 01/05/2016  . Allergic conjunctivitis 10/18/2011  . Allergic rhinitis 10/18/2011  . Diabetes mellitus with complication ([place]) 38/18/2993  . Hyperlipidemia associated with type 2 diabetes mellitus ([place]) 10/06/2011  . Essential hypertension, benign 10/06/2011    Past Surgical History:  Procedure Laterality Date  . ABDOMINAL HYSTERECTOMY  1990   and BSO; endometriosis  . ANTERIOR CERVICAL DECOMP/DISCECTOMY FUSION N/A 06/20/2013   Procedure: ANTERIOR CERVICAL DECOMPRESSION/DISCECTOMY FUSION 1 LEVEL;  Surgeon: Ophelia Charter, MD;  Location: [facility];  Service: Neurosurgery;  Laterality: N/A;  C45 anterior cervical decompression with fusion interbody prothesis plating and bonegraft  . BREAST REDUCTION SURGERY  1994  . CARPAL TUNNEL RELEASE Right 06/20/2013   Procedure: CARPAL TUNNEL RELEASE;  Surgeon: Ophelia Charter, MD;  Location: [facility];  Service: Neurosurgery;  Laterality: Right;  RIGHT carpal tunnel release  . COLONOSCOPY  03/06/12   sigmoid polyp; repeat 10 years; Dr. Tora Duck  . LUNG SURGERY  1987   endometriosis in lungs  . SUPRACLAVICAL NODE BIOPSY Left 01/14/2016   Procedure: LEFT SUPRACLAVICAL NODE BIOPSY;  Surgeon: Melrose Nakayama, MD;  Location: [facility];  Service: Thoracic;  Laterality: Left;    OB History    Gravida Para Term Preterm AB Living   '2 1       1   ' SAB TAB Ectopic Multiple Live Births  Home Medications    Prior to Admission medications   Medication Sig Start Date End Date Taking? Authorizing Provider  amLODipine (NORVASC) 10 MG tablet TAKE 1 TABLET BY MOUTH EVERY DAY 05/17/17  Yes Tysinger, Camelia Eng, PA-C  azaTHIOprine (IMURAN) 50 MG tablet Take 50 mg by mouth daily.   Yes [provider]  blood glucose meter kit and supplies KIT Dispense based on patient and insurance preference. Use up to four times daily as directed. (FOR ICD-9 250.00, 250.01). 03/08/16  Yes  Tysinger, Camelia Eng, PA-C  Budesonide (UCERIS) 9 MG TB24 Take 1 tablet by mouth daily.   Yes [provider]  glucose blood test strip Test blood sugar twice daily. Pt uses one touch ultra 07/21/16  Yes Tysinger, Camelia Eng, PA-C  insulin aspart protamine - aspart (NOVOLOG MIX 70/30 FLEXPEN) (70-30) 100 UNIT/ML FlexPen Inject 0.5 mLs (50 Units total) into the skin 3 (three) times daily. INJECT 20 UNITS INTO THE SKIN TWICE DAILY WITH A MEAL Patient taking differently: Inject 25 Units into the skin daily with lunch. 10 units in the morning if levels are high 25 units with dinner and 35 after dinner 07/21/16  Yes Tysinger, Camelia Eng, PA-C  Insulin Pen Needle (PEN NEEDLES) 32G X 4 MM MISC 1 each by Does not apply route daily. 06/27/16  Yes Tysinger, Camelia Eng, PA-C  Lancets 30G MISC Test blood sugars twice daily 09/14/15  Yes Tysinger, Camelia Eng, PA-C  lisinopril-hydrochlorothiazide (PRINZIDE,ZESTORETIC) 20-25 MG tablet TAKE 1 TABLET BY MOUTH EVERY DAY 03/27/17  Yes Tysinger, Camelia Eng, PA-C  nystatin cream (MYCOSTATIN) Apply 1 application topically 2 (two) times daily. Patient taking differently: Apply 1 application topically 2 (two) times daily as needed (CANDIDIASIS).  07/21/17  Yes Tysinger, Camelia Eng, PA-C  pravastatin (PRAVACHOL) 20 MG tablet Take 1 tablet (20 mg total) by mouth daily. 08/17/16  Yes Denita Lung, MD  traMADol (ULTRAM) 50 MG tablet Take 1-2 tablets (50-100 mg total) by mouth every 6 (six) hours as needed (pain). 04/11/17  Yes Tysinger, Camelia Eng, PA-C  metFORMIN (GLUCOPHAGE XR) 500 MG 24 hr tablet Take 2 tablets (1,000 mg total) by mouth 2 (two) times daily. Patient not taking: Reported on 07/28/2017 07/28/17   Tysinger, Camelia Eng, PA-C    Family History Family History  Problem Relation Age of Onset  . Heart disease Father 63       died of MI, CABG   . Multiple sclerosis Sister   . Asthma Brother   . Cancer Paternal Grandmother        breast  . Diabetes Other        maternal and paternal  grandmother  . Cancer Maternal Uncle        hematologic  . Hypertension Maternal Grandmother   . Cancer Maternal Grandfather        prostate  . Hypertension Maternal Grandfather   . Asthma Brother   . Eczema Sister   . Allergies Sister   . Healthy Daughter     Social History Social History  Substance Use Topics  . Smoking status: Never Smoker  . Smokeless tobacco: Never Used  . Alcohol use 0.0 oz/week     Comment: rare wine, maybe 2-3 times years     Allergies   Codeine and Metformin and related   Review of Systems Review of Systems  Constitutional: Negative for chills and fever.  HENT: Negative for ear pain and sore throat.   Eyes: Negative for pain and visual disturbance.  Respiratory: Negative for cough and shortness of breath.   Cardiovascular: Negative for chest pain and palpitations.  Gastrointestinal: Negative for abdominal pain and vomiting.  Genitourinary: Negative for dysuria and hematuria.  Musculoskeletal: Negative for arthralgias and back pain.  Skin: Negative for color change and rash.  Neurological: Positive for tremors and weakness. Negative for seizures and syncope.  All other systems reviewed and are negative.    Physical Exam Updated Vital Signs BP (!) 156/75 (BP Location: Left Arm)   Pulse (!) 115   Temp 98.6 F (37 C) (Oral)   Resp 20   LMP 12/27/1987   SpO2 95%   Physical Exam  Constitutional: She is oriented to person, place, and time. She appears well-developed and well-nourished. No distress.  HENT:  Head: Normocephalic and atraumatic.  Eyes: Conjunctivae are normal.  Neck: Neck supple.  Cardiovascular: Normal rate and regular rhythm.   No murmur heard. Pulmonary/Chest: Effort normal and breath sounds normal. No respiratory distress.  Abdominal: Soft. There is no tenderness.  Musculoskeletal: She exhibits no edema.  Neurological: She is alert and oriented to person, place, and time. She displays normal reflexes. No cranial nerve  deficit or sensory deficit. She exhibits normal muscle tone. Coordination (tremulous on exam upper extremities. Normal DTRs. ) abnormal.  Skin: Skin is warm and dry.  Psychiatric: She has a normal mood and affect.  Nursing note and vitals reviewed.    ED Treatments / Results  Labs (all labs ordered are listed, but only abnormal results are displayed) Labs Reviewed  CBC WITH DIFFERENTIAL/PLATELET - Abnormal; Notable for the following:       Result Value   WBC 2.8 (*)    Hemoglobin 11.8 (*)    HCT 34.6 (*)    RDW 16.3 (*)    Neutro Abs 1.6 (*)    All other components within normal limits  BASIC METABOLIC PANEL - Abnormal; Notable for the following:    Potassium 3.4 (*)    Glucose, Bld 226 (*)    All other components within normal limits  URINALYSIS, ROUTINE W REFLEX MICROSCOPIC - Abnormal; Notable for the following:    Color, Urine STRAW (*)    Glucose, UA 50 (*)    All other components within normal limits    EKG  EKG Interpretation None       Radiology No results found.  Procedures Procedures (including critical care time)  Medications Ordered in ED Medications - No data to display   Initial Impression / Assessment and Plan / ED Course  I have reviewed the triage vital signs and the nursing notes.  Pertinent labs & imaging results that were available during my care of the patient were reviewed by me and considered in my medical decision making (see chart for details).     On reassessment, all tremors have ceased. Basic labs reassuring. Neutropenic on CBC - but chronic. Unclear etiology of symptoms, patient with inconsistent exam (ie able to control shaking during IV attempts). Patient able to walk without difficulty. Given non-focal neuro exam, no indication for further imaging at this time. Discussed close follow up with PCM. Return precautions discussed. Patient in agreement with plan at time of discharge.  Patient and plan of care discussed with Attending  physician, Dr. Oleta Mouse.    Final Clinical Impressions(s) / ED Diagnoses   Final diagnoses:  Tremor    New Prescriptions New Prescriptions   No medications on file     Arnetha Massy, MD 07/29/17 [phone]  Forde Dandy, MD 07/29/17 [phone]

## 2017-07-29 NOTE — Discharge Instructions (Signed)
Your work up was reassuring today. Please return to ED with any worsening of symptoms. Call your neurologist for follow up.

## 2017-07-29 NOTE — ED Provider Notes (Signed)
I saw and evaluated the patient, reviewed the resident's note and I agree with the findings and plan.   EKG Interpretation None      56 year old female with history of sarcoidosis who presents with tremulousness. States she was eating dinner about 45 minutes PTA and had sudden onset of shaking her arms bilaterally. No LOC, fever, syncope, near syncope,chest pain dyspnea. No vision or speech changes. No focal numbness/weakness. No recent illnesses.  Patient has no focal neuro deficits. Ambulatory in ED. Tremorring ceased in ED. Does not seem c/w seizure, stroke or acute neurological process at this time. Blood work with hyperglycemia, but no DKA. No other electrolyte or metabolic derangements. UA negative for infection. At this time, felt stable for discharge with close outpatient PCP follow-up.     Forde Dandy, MD 07/29/17 [phone]

## 2017-08-04 ENCOUNTER — Ambulatory Visit (INDEPENDENT_AMBULATORY_CARE_PROVIDER_SITE_OTHER): Payer: Worker's Compensation | Admitting: Orthopaedic Surgery

## 2017-08-04 ENCOUNTER — Encounter (INDEPENDENT_AMBULATORY_CARE_PROVIDER_SITE_OTHER): Payer: Self-pay | Admitting: Orthopaedic Surgery

## 2017-08-04 DIAGNOSIS — M25511 Pain in right shoulder: Secondary | ICD-10-CM

## 2017-08-04 DIAGNOSIS — G8929 Other chronic pain: Secondary | ICD-10-CM | POA: Diagnosis not present

## 2017-08-04 NOTE — Progress Notes (Signed)
Office Visit Note   Patient: Bethany Webb           Date of Birth: 02/13/1961           MRN: [id number] Visit Date: 08/04/2017              Requested by: Denita Lung, MD [id number] [place], [place] [address] PCP: Denita Lung, MD   Assessment & Plan: Visit Diagnoses:  1. Chronic right shoulder pain     Plan: MRI of the shoulder shows mainly degenerative changes of the rotator cuff without any frank tears. She does have biceps tenosynovitis. She has some degenerative tearing of the labrum. She also has synovitis of her glenohumeral joint. Overall she is doing better. We talked about an intra-articular steroid injection if this continues to be a problem. For now she is doing okay. We'll see her back as needed. May continue to work without restrictions.  Follow-Up Instructions: Return if symptoms worsen or fail to improve.   Orders:  No orders of the defined types were placed in this encounter.  No orders of the defined types were placed in this encounter.     Procedures: No procedures performed   Clinical Data: No additional findings.   Subjective: Chief Complaint  Patient presents with  . Right Shoulder - Pain    Patient follows up today to review her MRI. She states that she is doing better. The pain comes and goes and takes ibuprofen as needed. She is been back to work without restrictions.    Review of Systems  Constitutional: Negative.   HENT: Negative.   Eyes: Negative.   Respiratory: Negative.   Cardiovascular: Negative.   Endocrine: Negative.   Musculoskeletal: Negative.   Neurological: Negative.   Hematological: Negative.   Psychiatric/Behavioral: Negative.   All other systems reviewed and are negative.    Objective: Vital Signs: LMP 12/27/1987   Physical Exam  Constitutional: She is oriented to person, place, and time. She appears well-developed and well-nourished.  Pulmonary/Chest: Effort normal.  Neurological: She is  alert and oriented to person, place, and time.  Skin: Skin is warm. Capillary refill takes less than 2 seconds.  Psychiatric: She has a normal mood and affect. Her behavior is normal. Judgment and thought content normal.  Nursing note and vitals reviewed.   Ortho Exam Right shoulder exam shows mildly positive impingement signs. Rotator cuff is grossly intact. Negative cross adduction. Mildly positive speed Specialty Comments:  No specialty comments available.  Imaging: No results found.   PMFS History: Patient Active Problem List   Diagnosis Date Noted  . Nausea 09/15/2016  . Abdominal pain, epigastric 09/15/2016  . RUQ pain 09/15/2016  . 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 Sarcoidosis 01/21/2016  . 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 Abnormal liver function 01/21/2016  . Elevated liver enzymes 01/05/2016  . Abnormal chest CT 01/05/2016  . Lymphadenopathy, mediastinal 01/05/2016  . Pleural plaque 01/05/2016  . Pulmonary nodule 01/05/2016  . Anorexia 01/05/2016  . Loss of weight 01/05/2016  . Night sweats 01/05/2016  . Allergic conjunctivitis 10/18/2011  . Allergic rhinitis 10/18/2011  . Diabetes mellitus with complication ([place]) 94/49/6759  . Hyperlipidemia associated with type 2 diabetes mellitus ([place]) 10/06/2011  . Essential hypertension, benign 10/06/2011   Past Medical History:  Diagnosis Date  . 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  Allergy   . Biceps tendonitis 2/12   Dr. Gladstone Lighter  . Diabetes mellitus    type II  . History of blood transfusion 1988   hysterectomy  . History of mammogram 05/25/12   stable, no suspicious finding, extremely dense breast tissue  . Hyperlipidemia   . Hypertension   . Neutropenia, unspecified ([place]) 02/2013   slight, likely ethnicity related  . Pneumococcal vaccine refused 03/2013  . Refused influenza  vaccine 03/2013  . S/P hysterectomy   . Sarcoidosis   . Shortness of breath dyspnea    with exertion   . Supraspinatus tendonitis 2/12   Dr. Gladstone Lighter  . Wears glasses     Family History  Problem Relation Age of Onset  . Heart disease Father 34       died of MI, CABG   . Multiple sclerosis Sister   . Asthma Brother   . Cancer Paternal Grandmother        breast  . Diabetes Other        maternal and paternal grandmother  . Cancer Maternal Uncle        hematologic  . Hypertension Maternal Grandmother   . Cancer Maternal Grandfather        prostate  . Hypertension Maternal Grandfather   . Asthma Brother   . Eczema Sister   . Allergies Sister   . Healthy Daughter     Past Surgical History:  Procedure Laterality Date  . ABDOMINAL HYSTERECTOMY  1990   and BSO; endometriosis  . ANTERIOR CERVICAL DECOMP/DISCECTOMY FUSION N/A 06/20/2013   Procedure: ANTERIOR CERVICAL DECOMPRESSION/DISCECTOMY FUSION 1 LEVEL;  Surgeon: Ophelia Charter, MD;  Location: [facility];  Service: Neurosurgery;  Laterality: N/A;  C45 anterior cervical decompression with fusion interbody prothesis plating and bonegraft  . BREAST REDUCTION SURGERY  1994  . CARPAL TUNNEL RELEASE Right 06/20/2013   Procedure: CARPAL TUNNEL RELEASE;  Surgeon: Ophelia Charter, MD;  Location: [facility];  Service: Neurosurgery;  Laterality: Right;  RIGHT carpal tunnel release  . COLONOSCOPY  03/06/12   sigmoid polyp; repeat 10 years; Dr. Tora Duck  . LUNG SURGERY  1987   endometriosis in lungs  . SUPRACLAVICAL NODE BIOPSY Left 01/14/2016   Procedure: LEFT SUPRACLAVICAL NODE BIOPSY;  Surgeon: Melrose Nakayama, MD;  Location: [facility];  Service: Thoracic;  Laterality: Left;   Social History   Occupational History  . Midwife    Social History Main Topics  . Smoking status: Never Smoker  . Smokeless tobacco: Never Used  . Alcohol use 0.0 oz/week     Comment: rare wine, maybe 2-3 times years  . Drug use: No  .  Sexual activity: No

## 2017-08-05 ENCOUNTER — Other Ambulatory Visit: Payer: Self-pay | Admitting: Medical

## 2017-08-16 ENCOUNTER — Telehealth (INDEPENDENT_AMBULATORY_CARE_PROVIDER_SITE_OTHER): Payer: Self-pay

## 2017-08-16 NOTE — Telephone Encounter (Signed)
Faxed the 08/04/17 office note to [facility] per his request

## 2017-08-31 ENCOUNTER — Ambulatory Visit: Payer: Self-pay | Admitting: Medical

## 2017-09-04 ENCOUNTER — Other Ambulatory Visit: Payer: Self-pay | Admitting: Medical

## 2017-09-05 ENCOUNTER — Telehealth: Payer: Self-pay | Admitting: Medical

## 2017-09-05 NOTE — Telephone Encounter (Signed)
done

## 2017-09-05 NOTE — Telephone Encounter (Signed)
Pt requesting refill on Contour Next test strips. She tests 3-4 times a day.

## 2017-09-14 ENCOUNTER — Other Ambulatory Visit: Payer: Self-pay | Admitting: Medical

## 2017-09-21 ENCOUNTER — Encounter: Payer: Self-pay | Admitting: Medical

## 2017-09-21 ENCOUNTER — Ambulatory Visit (INDEPENDENT_AMBULATORY_CARE_PROVIDER_SITE_OTHER): Payer: BLUE CROSS/BLUE SHIELD | Admitting: Medical

## 2017-09-21 VITALS — BP 126/70 | HR 67 | Wt 159.8 lb

## 2017-09-21 DIAGNOSIS — I10 Essential (primary) hypertension: Secondary | ICD-10-CM

## 2017-09-21 DIAGNOSIS — E1169 Type 2 diabetes mellitus with other specified complication: Secondary | ICD-10-CM | POA: Diagnosis not present

## 2017-09-21 DIAGNOSIS — Z7189 Other specified counseling: Secondary | ICD-10-CM

## 2017-09-21 DIAGNOSIS — Z23 Encounter for immunization: Secondary | ICD-10-CM | POA: Diagnosis not present

## 2017-09-21 DIAGNOSIS — R21 Rash and other nonspecific skin eruption: Secondary | ICD-10-CM | POA: Diagnosis not present

## 2017-09-21 DIAGNOSIS — E785 Hyperlipidemia, unspecified: Secondary | ICD-10-CM

## 2017-09-21 DIAGNOSIS — E118 Type 2 diabetes mellitus with unspecified complications: Secondary | ICD-10-CM | POA: Diagnosis not present

## 2017-09-21 DIAGNOSIS — Z7185 Encounter for immunization safety counseling: Secondary | ICD-10-CM

## 2017-09-21 MED ORDER — NYSTATIN 100000 UNIT/GM EX CREA: 1.0000 "application " | TOPICAL_CREAM | Freq: Two times a day (BID) | CUTANEOUS | 2 refills | Status: DC

## 2017-09-21 MED ORDER — PRAVASTATIN SODIUM 40 MG PO TABS: ORAL_TABLET | ORAL | 0 refills | Status: DC

## 2017-09-21 NOTE — Progress Notes (Signed)
Subjective: Chief Complaint  Patient presents with  . med check    med check ,  rash on butt    Here for rash. Gets a rash at top of butt crack a few times per year, starts with blisters and tenderness, then the blisters pop leaving tender area that takes a week or 2 to resolve.  She notes in the past using fungal cream for this that seems to help.   She ran out of the cream.     sarcoidosis -  Just saw sarcoid specialist 2 weeks ago.  everything was stable, has f/u 10/2017.  They do want her to enroll in another study.  She is doing well of late.   Off prednisone for a few months now.  Diabetes - hasn't seen endocrinology in months.   compliant with medication, seeing better sugar numbers of late.     Not exercising.  No other aggravating or relieving factors. No other complaint.  Past Medical History:  Diagnosis Date  . 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 Allergy   . Biceps tendonitis 2/12   Dr. Gladstone Lighter  . Diabetes mellitus    type II  . History of blood transfusion 1988   hysterectomy  . History of mammogram 05/25/12   stable, no suspicious finding, extremely dense breast tissue  . Hyperlipidemia   . Hypertension   . Neutropenia, unspecified ([place]) 02/2013   slight, likely ethnicity related  . Pneumococcal vaccine refused 03/2013  . Refused influenza vaccine 03/2013  . S/P hysterectomy   . Sarcoidosis   . Shortness of breath dyspnea    with exertion   . Supraspinatus tendonitis 2/12   Dr. Gladstone Lighter  . Wears glasses    Current Outpatient Prescriptions on File Prior to Visit  Medication Sig Dispense Refill  . amLODipine (NORVASC) 10 MG tablet TAKE 1 TABLET BY MOUTH EVERY DAY 90 tablet 3  . azaTHIOprine (IMURAN) 50 MG tablet Take 50 mg by mouth daily.    . blood glucose meter kit and supplies KIT Dispense based on patient and insurance preference. Use up to four times daily as directed. (FOR  ICD-9 250.00, 250.01). 1 each 0  . Budesonide (UCERIS) 9 MG TB24 Take 1 tablet by mouth daily.    . CONTOUR NEXT TEST test strip TEST BLOOD SUGAR TWICE DAILY 100 each 0  . Insulin Pen Needle (PEN NEEDLES) 32G X 4 MM MISC 1 each by Does not apply route daily. 200 each 1  . Lancets 30G MISC Test blood sugars twice daily 100 each 2  . lisinopril-hydrochlorothiazide (PRINZIDE,ZESTORETIC) 20-25 MG tablet TAKE 1 TABLET BY MOUTH EVERY DAY 90 tablet 3  . metFORMIN (GLUCOPHAGE-XR) 500 MG 24 hr tablet TAKE 2 TABLETS(1000 MG) BY MOUTH TWICE DAILY 120 tablet 0  . NOVOLOG MIX 70/30 FLEXPEN (70-30) 100 UNIT/ML FlexPen INJECT 50 UNITS THREE TIMES DAILY WITH A MEAL 45 mL 0  . pravastatin (PRAVACHOL) 20 MG tablet Take 1 tablet (20 mg total) by mouth daily. 90 tablet 3  . traMADol (ULTRAM) 50 MG tablet Take 1-2 tablets (50-100 mg total) by mouth every 6 (six) hours as needed (pain). 30 tablet 0   No current facility-administered medications on file prior to visit.    ROS as in subjective   Objective: BP 126/70   Pulse 67   Wt 159 lb 12.8 oz (72.5 kg)   LMP 12/27/1987   SpO2 98%  BMI 27.43 kg/m   Gen: wd, wn, nad Skin: top of gluteal cleft with 2 oval yellowish tender lesions with surrounding erythema.  No cyst noted No fluctuance or induration otherwise not examined    Assessment: Encounter Diagnoses  Name Primary?  . Rash Yes  . Need for influenza vaccination   . Hyperlipidemia associated with type 2 diabetes mellitus ([place])   . Essential hypertension, benign   . Diabetes mellitus with complication ([place])   . Vaccine counseling     Plan: Rash - viral culture sent.  Begin Mycostatin cream.     Diabetes, hyperlipidemia, HTN - c/t same medication, return Monday for fasting labs  Counseled on the influenza virus vaccine.  Vaccine information sheet given.  Influenza vaccine given after consent obtained.  Advsied Tdap vaccine, pneumococcal 23, and Shingrix.     She will call insurance for  coverage's.   Kyelle was seen today for med check.  Diagnoses and all orders for this visit:  Rash -     Herpes simplex virus culture  Need for influenza vaccination -     Flu Vaccine QUAD 36+ mos IM  Hyperlipidemia associated with type 2 diabetes mellitus ([place]) -     Comprehensive metabolic panel; Future -     CBC; Future -     Lipid panel; Future  Essential hypertension, benign -     Comprehensive metabolic panel; Future -     CBC; Future  Diabetes mellitus with complication (HCC) -     Comprehensive metabolic panel; Future -     CBC; Future -     Hemoglobin A1c; Future  Vaccine counseling  Other orders -     nystatin cream (MYCOSTATIN); Apply 1 application topically 2 (two) times daily. -     pravastatin (PRAVACHOL) 40 MG tablet; TAKE 1 TABLET(40 MG) BY MOUTH DAILY

## 2017-09-25 LAB — HERPES SIMPLEX VIRUS CULTURE
MICRO NUMBER: 81073275
SPECIMEN QUALITY: ADEQUATE

## 2017-10-10 ENCOUNTER — Other Ambulatory Visit: Payer: Self-pay | Admitting: Medical

## 2017-10-19 ENCOUNTER — Other Ambulatory Visit: Payer: Self-pay | Admitting: Medical

## 2017-10-27 DIAGNOSIS — E119 Type 2 diabetes mellitus without complications: Secondary | ICD-10-CM | POA: Diagnosis not present

## 2017-10-29 ENCOUNTER — Other Ambulatory Visit: Payer: Self-pay | Admitting: Medical

## 2017-10-31 ENCOUNTER — Ambulatory Visit: Payer: Self-pay | Admitting: Family Medicine

## 2017-10-31 ENCOUNTER — Other Ambulatory Visit: Payer: Self-pay

## 2017-10-31 ENCOUNTER — Emergency Department (HOSPITAL_COMMUNITY)
Admission: EM | Admit: 2017-10-31 | Discharge: 2017-10-31 | Disposition: A | Discharge status: Home / Self Care | Payer: 59 | Attending: Physician Assistant | Admitting: Physician Assistant

## 2017-10-31 DIAGNOSIS — Y92410 Unspecified street and highway as the place of occurrence of the external cause: Secondary | ICD-10-CM | POA: Insufficient documentation

## 2017-10-31 DIAGNOSIS — Y999 Unspecified external cause status: Secondary | ICD-10-CM | POA: Insufficient documentation

## 2017-10-31 DIAGNOSIS — Y9389 Activity, other specified: Secondary | ICD-10-CM | POA: Insufficient documentation

## 2017-10-31 DIAGNOSIS — S39012A Strain of muscle, fascia and tendon of lower back, initial encounter: Secondary | ICD-10-CM | POA: Diagnosis not present

## 2017-10-31 DIAGNOSIS — Z79899 Other long term (current) drug therapy: Secondary | ICD-10-CM | POA: Diagnosis not present

## 2017-10-31 DIAGNOSIS — Y33XXXA Other specified events, undetermined intent, initial encounter: Secondary | ICD-10-CM | POA: Diagnosis not present

## 2017-10-31 DIAGNOSIS — I10 Essential (primary) hypertension: Secondary | ICD-10-CM | POA: Insufficient documentation

## 2017-10-31 DIAGNOSIS — E119 Type 2 diabetes mellitus without complications: Secondary | ICD-10-CM | POA: Insufficient documentation

## 2017-10-31 DIAGNOSIS — Z7902 Long term (current) use of antithrombotics/antiplatelets: Secondary | ICD-10-CM | POA: Insufficient documentation

## 2017-10-31 DIAGNOSIS — Z794 Long term (current) use of insulin: Secondary | ICD-10-CM | POA: Diagnosis not present

## 2017-10-31 DIAGNOSIS — S3992XA Unspecified injury of lower back, initial encounter: Secondary | ICD-10-CM | POA: Diagnosis present

## 2017-10-31 LAB — COMPREHENSIVE METABOLIC PANEL
ALBUMIN: 4 g/dL (ref 3.5–5.0)
ALT: 20 U/L (ref 14–54)
ANION GAP: 11 (ref 5–15)
AST: 33 U/L (ref 15–41)
Alkaline Phosphatase: 116 U/L (ref 38–126)
BILIRUBIN TOTAL: 0.7 mg/dL (ref 0.3–1.2)
BUN: 25 mg/dL — ABNORMAL HIGH (ref 6–20)
CO2: 26 mmol/L (ref 22–32)
Calcium: 9.4 mg/dL (ref 8.9–10.3)
Chloride: 101 mmol/L (ref 101–111)
Creatinine, Ser: 0.92 mg/dL (ref 0.44–1.00)
GFR calc Af Amer: >60 mL/min (ref >60)
GFR calc non Af Amer: >60 mL/min (ref >60)
GLUCOSE: 208 mg/dL — AB (ref 65–99)
POTASSIUM: 3.5 mmol/L (ref 3.5–5.1)
Sodium: 138 mmol/L (ref 135–145)
TOTAL PROTEIN: 7.4 g/dL (ref 6.5–8.1)

## 2017-10-31 LAB — CBC WITH DIFFERENTIAL/PLATELET
Basophils Absolute: 0 10*3/uL (ref 0.0–0.1)
Basophils Relative: 0 %
Eosinophils Absolute: 0.1 10*3/uL (ref 0.0–0.7)
Eosinophils Relative: 1 %
HEMATOCRIT: 35.7 % — AB (ref 36.0–46.0)
Hemoglobin: 12 g/dL (ref 12.0–15.0)
Lymphocytes Relative: 33 %
Lymphs Abs: 1.4 10*3/uL (ref 0.7–4.0)
MCH: 30.2 pg (ref 26.0–34.0)
MCHC: 33.6 g/dL (ref 30.0–36.0)
MCV: 89.9 fL (ref 78.0–100.0)
MONO ABS: 0.4 10*3/uL (ref 0.1–1.0)
MONOS PCT: 10 %
NEUTROS ABS: 2.3 10*3/uL (ref 1.7–7.7)
Neutrophils Relative %: 56 %
Platelets: 331 10*3/uL (ref 150–400)
RBC: 3.97 MIL/uL (ref 3.87–5.11)
RDW: 13.9 % (ref 11.5–15.5)
WBC: 4.1 10*3/uL (ref 4.0–10.5)

## 2017-10-31 LAB — URINALYSIS, ROUTINE W REFLEX MICROSCOPIC
BILIRUBIN URINE: NEGATIVE
GLUCOSE, UA: NEGATIVE mg/dL
HGB URINE DIPSTICK: NEGATIVE
KETONES UR: 5 mg/dL — AB
NITRITE: NEGATIVE
PH: 6 (ref 5.0–8.0)
Protein, ur: NEGATIVE mg/dL
Specific Gravity, Urine: 1.023 (ref 1.005–1.030)

## 2017-10-31 MED ORDER — IBUPROFEN 800 MG PO TABS: 800.0000 mg | ORAL_TABLET | Freq: Three times a day (TID) | ORAL | 0 refills | Status: DC

## 2017-10-31 MED ORDER — ONDANSETRON 4 MG PO TBDP: 4.0000 mg | ORAL_TABLET | Freq: Three times a day (TID) | ORAL | 0 refills | Status: DC | PRN

## 2017-10-31 MED ORDER — OXYCODONE-ACETAMINOPHEN 5-325 MG PO TABS: 1.0000 | ORAL_TABLET | Freq: Four times a day (QID) | ORAL | 0 refills | Status: DC | PRN

## 2017-10-31 MED ORDER — SODIUM CHLORIDE 0.9 % IV BOLUS (SEPSIS)
1000.0000 mL | Freq: Once | INTRAVENOUS | Status: AC
  Administered 2017-10-31: 1000 mL via INTRAVENOUS

## 2017-10-31 MED ORDER — OXYCODONE-ACETAMINOPHEN 5-325 MG PO TABS
1.0000 | ORAL_TABLET | Freq: Once | ORAL | Status: AC
  Administered 2017-10-31: 1 via ORAL
  Filled 2017-10-31: qty 1

## 2017-10-31 NOTE — ED Triage Notes (Signed)
Diagnosed with sarcoidosis 2 years ago. Pain to lower back that has progressively gotten worse over the last 2-3 weeks. Pain 10/10.

## 2017-10-31 NOTE — ED Provider Notes (Signed)
[facility] DEPT Provider Note   CSN: [id number] Arrival date & time: 10/31/17  1438     History   Chief Complaint Chief Complaint  Patient presents with  . Back Pain    HPI Bethany Webb is a 56 y.o. female.  HPI   Patient is a 56 year old female resenting with back pain in her back.  Patient reports that she sometimes has back pain associated with her sarcoidosis.  She googled her symptoms and diagnosed herself with a kidney infection.  Patient has had no urinary tract infection symptoms.  She had no burning with urination no fever no trouble urinating.  Patient has not lifted anything heavily.  The pain is not worse with movement.  She does has low back pain across the lower back.  Mildly worse on the right-hand side than left.  Past Medical History:  Diagnosis Date  . 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 Allergy   . Biceps tendonitis 2/12   Dr. Gladstone Lighter  . Diabetes mellitus    type II  . History of blood transfusion 1988   hysterectomy  . History of mammogram 05/25/12   stable, no suspicious finding, extremely dense breast tissue  . Hyperlipidemia   . Hypertension   . Neutropenia, unspecified ([place]) 02/2013   slight, likely ethnicity related  . Pneumococcal vaccine refused 03/2013  . Refused influenza vaccine 03/2013  . S/P hysterectomy   . Sarcoidosis   . Shortness of breath dyspnea    with exertion   . Supraspinatus tendonitis 2/12   Dr. Gladstone Lighter  . Wears glasses     Patient Active Problem List   Diagnosis Date Noted  . Need for influenza vaccination 09/21/2017  . Vaccine counseling 09/21/2017  . Nausea 09/15/2016  . Abdominal pain, epigastric 09/15/2016  . RUQ pain 09/15/2016  . 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 Sarcoidosis 01/21/2016  . 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 Abnormal liver  function 01/21/2016  . Elevated liver enzymes 01/05/2016  . Abnormal chest CT 01/05/2016  . Lymphadenopathy, mediastinal 01/05/2016  . Pleural plaque 01/05/2016  . Pulmonary nodule 01/05/2016  . Anorexia 01/05/2016  . Loss of weight 01/05/2016  . Night sweats 01/05/2016  . Allergic conjunctivitis 10/18/2011  . Allergic rhinitis 10/18/2011  . Diabetes mellitus with complication ([place]) 17/61/6073  . Hyperlipidemia associated with type 2 diabetes mellitus ([place]) 10/06/2011  . Essential hypertension, benign 10/06/2011    Past Surgical History:  Procedure Laterality Date  . ABDOMINAL HYSTERECTOMY  1990   and BSO; endometriosis  . BREAST REDUCTION SURGERY  1994  . COLONOSCOPY  03/06/12   sigmoid polyp; repeat 10 years; Dr. Tora Duck  . LUNG SURGERY  1987   endometriosis in lungs    OB History    Gravida Para Term Preterm AB Living   _0 SAB TAB Ectopic Multiple Live Births                   Home Medications    Prior to Admission medications   Medication Sig Start Date End Date Taking? Authorizing Provider  amLODipine (NORVASC) 10 MG tablet TAKE 1 TABLET BY MOUTH EVERY DAY 05/17/17   Tysinger, Camelia Eng, PA-C  azaTHIOprine (IMURAN) 50 MG tablet Take 50 mg by mouth daily.    [provider]  BAYER CONTOUR TEST test strip TEST BLOOD SUGAR TWICE DAILY 10/30/17   Tysinger, Camelia Eng, PA-C  BD PEN NEEDLE NANO U/F 32G X 4 MM MISC INJECT THREE TIMES DAILY AS DIRECTED 10/10/17   Tysinger, Camelia Eng, PA-C  blood glucose meter kit and supplies KIT Dispense based on patient and insurance preference. Use up to four times daily as directed. (FOR ICD-9 250.00, 250.01). 03/08/16   Tysinger, Camelia Eng, PA-C  Budesonide (UCERIS) 9 MG TB24 Take 1 tablet by mouth daily.    [provider]  Lancets 30G MISC Test blood sugars twice daily 09/14/15   Tysinger, Camelia Eng, PA-C  lisinopril-hydrochlorothiazide (PRINZIDE,ZESTORETIC) 20-25 MG tablet TAKE 1 TABLET BY MOUTH EVERY DAY 03/27/17    Tysinger, Camelia Eng, PA-C  metFORMIN (GLUCOPHAGE-XR) 500 MG 24 hr tablet TAKE 2 TABLETS(1000 MG) BY MOUTH TWICE DAILY 10/19/17   Tysinger, Camelia Eng, PA-C  NOVOLOG MIX 70/30 FLEXPEN (70-30) 100 UNIT/ML FlexPen INJECT 50 UNITS THREE TIMES DAILY WITH A MEAL 08/07/17   Tysinger, Camelia Eng, PA-C  nystatin cream (MYCOSTATIN) Apply 1 application topically 2 (two) times daily. 09/21/17   Tysinger, Camelia Eng, PA-C  pravastatin (PRAVACHOL) 20 MG tablet Take 1 tablet (20 mg total) by mouth daily. 08/17/16   Denita Lung, MD  pravastatin (PRAVACHOL) 40 MG tablet TAKE 1 TABLET(40 MG) BY MOUTH DAILY 09/21/17   Tysinger, Camelia Eng, PA-C  traMADol (ULTRAM) 50 MG tablet Take 1-2 tablets (50-100 mg total) by mouth every 6 (six) hours as needed (pain). 04/11/17   Tysinger, Camelia Eng, PA-C    Family History Family History  Problem Relation Age of Onset  . Heart disease Father 44       died of MI, CABG   . Multiple sclerosis Sister   . Asthma Brother   . Cancer Paternal Grandmother        breast  . Diabetes Other        maternal and paternal grandmother  . Cancer Maternal Uncle        hematologic  . Hypertension Maternal Grandmother   . Cancer Maternal Grandfather        prostate  . Hypertension Maternal Grandfather   . Asthma Brother   . Eczema Sister   . Allergies Sister   . Healthy Daughter     Social History Social History   Tobacco Use  . Smoking status: Never Smoker  . Smokeless tobacco: Never Used  Substance Use Topics  . Alcohol use: Yes    Alcohol/week: 0.0 oz    Comment: rare wine, maybe 2-3 times years  . Drug use: No     Allergies   Codeine and Metformin and related   Review of Systems Review of Systems  Constitutional: Negative for activity change.  Respiratory: Negative for shortness of breath.   Cardiovascular: Negative for chest pain.  Gastrointestinal: Negative for abdominal pain.  Genitourinary: Negative for dysuria, enuresis, frequency and hematuria.  Musculoskeletal:  Positive for back pain.     Physical Exam Updated Vital Signs BP (!) 171/85 (BP Location: Left Arm)   Pulse (!) 113   Temp 98.8 F (37.1 C) (Oral)   Resp 19   Ht _0  (1.626 m)   Wt 74.8 kg (165 lb)   LMP 12/27/1987   SpO2 99%   BMI 28.32 kg/m   Physical Exam  Constitutional: She is oriented to person, place, and time. She appears well-developed and well-nourished.  HENT:  Head: Normocephalic and atraumatic.  Eyes: Right eye exhibits  no discharge.  Cardiovascular: Normal rate, regular rhythm and normal heart sounds.  No murmur heard. Pulmonary/Chest: Effort normal and breath sounds normal. She has no wheezes. She has no rales.  Abdominal: Soft. She exhibits no distension. There is no tenderness.  Musculoskeletal:  Mild right CVA tenderness.  No pain with movement.  Ambulatory to the bathroom without issue.  Neurological: She is oriented to person, place, and time.  Skin: Skin is warm and dry. She is not diaphoretic.  Psychiatric: She has a normal mood and affect.  Nursing note and vitals reviewed.    ED Treatments / Results  Labs (all labs ordered are listed, but only abnormal results are displayed) Labs Reviewed  URINALYSIS, ROUTINE W REFLEX MICROSCOPIC  CBC WITH DIFFERENTIAL/PLATELET  COMPREHENSIVE METABOLIC PANEL    EKG  EKG Interpretation None       Radiology No results found.  Procedures Procedures (including critical care time)  Medications Ordered in ED Medications - No data to display   Initial Impression / Assessment and Plan / ED Course  I have reviewed the triage vital signs and the nursing notes.  Pertinent labs & imaging results that were available during my care of the patient were reviewed by me and considered in my medical decision making (see chart for details).     Patient is a 56 year old female resenting with back pain in her back.  Patient reports that she sometimes has back pain associated with her sarcoidosis.  She googled  her symptoms and diagnosed herself with a kidney infection.  Patient has had no urinary tract infection symptoms.  She had no burning with urination no fever no trouble urinating.  Patient has not lifted anything heavily.  The pain is not worse with movement.  She does has low back pain across the lower back.  Mildly worse on the right-hand side than left.  4:40 PM Since patient is concerned about either "liver sarcoidosis" or kidney infection we will get baseline labs and UA.  Patient clinically appears very well.  Patient's liver enzymes are normal.  Her kidney function is normal.  Urine is normal.  I think that she probably has low back strain.  We will have her follow-up with her various specialist for sarcoidosis in [place].    Final Clinical Impressions(s) / ED Diagnoses   Final diagnoses:  None    ED Discharge Orders    None       Macarthur Critchley, MD 10/31/17 2159

## 2017-10-31 NOTE — ED Notes (Signed)
This RN unsuccessfully attempted to gain IV access twice so labs will be delayed. Will ask another RN to gain IV access.

## 2017-10-31 NOTE — Discharge Instructions (Signed)
We are unsure what is causing your pain today.  Please return with any concerns.

## 2017-10-31 NOTE — ED Notes (Signed)
ED Provider at bedside. 

## 2017-11-03 ENCOUNTER — Ambulatory Visit: Payer: Self-pay | Admitting: Medical

## 2017-11-04 ENCOUNTER — Telehealth: Payer: Self-pay | Admitting: Medical

## 2017-11-04 NOTE — Telephone Encounter (Signed)
Recv'd fax for P.A. CONTOUR TEST STRIPS, called [facility] for covered alternative and One Touch Ultra Blue is covered, switched pt's meter and strips

## 2017-11-08 ENCOUNTER — Other Ambulatory Visit: Payer: Self-pay

## 2017-11-08 MED ORDER — GLUCOSE BLOOD VI STRP: ORAL_STRIP | 12 refills | Status: DC

## 2017-11-19 ENCOUNTER — Other Ambulatory Visit: Payer: Self-pay | Admitting: Medical

## 2017-11-28 ENCOUNTER — Other Ambulatory Visit: Payer: Self-pay | Admitting: Medical

## 2017-12-08 ENCOUNTER — Other Ambulatory Visit: Payer: Self-pay | Admitting: Medical

## 2017-12-15 ENCOUNTER — Other Ambulatory Visit: Payer: Self-pay | Admitting: Medical

## 2017-12-15 NOTE — Telephone Encounter (Signed)
Called and l/m for pt call us about refills

## 2017-12-15 NOTE — Telephone Encounter (Signed)
Pt called to let Tarsh know that she does not need refills on any meds right now. She thinks the pharmacy are send [name] auto refill request.

## 2017-12-24 ENCOUNTER — Other Ambulatory Visit: Payer: Self-pay | Admitting: Medical

## 2018-01-04 ENCOUNTER — Other Ambulatory Visit: Payer: 59

## 2018-01-04 DIAGNOSIS — E785 Hyperlipidemia, unspecified: Secondary | ICD-10-CM | POA: Diagnosis not present

## 2018-01-04 DIAGNOSIS — I10 Essential (primary) hypertension: Secondary | ICD-10-CM

## 2018-01-04 DIAGNOSIS — D709 Neutropenia, unspecified: Secondary | ICD-10-CM

## 2018-01-04 DIAGNOSIS — Z79899 Other long term (current) drug therapy: Secondary | ICD-10-CM

## 2018-01-04 DIAGNOSIS — D72819 Decreased white blood cell count, unspecified: Secondary | ICD-10-CM

## 2018-01-04 DIAGNOSIS — E1169 Type 2 diabetes mellitus with other specified complication: Secondary | ICD-10-CM | POA: Diagnosis not present

## 2018-01-04 DIAGNOSIS — E118 Type 2 diabetes mellitus with unspecified complications: Secondary | ICD-10-CM

## 2018-01-04 DIAGNOSIS — T50905D Adverse effect of unspecified drugs, medicaments and biological substances, subsequent encounter: Secondary | ICD-10-CM

## 2018-01-05 LAB — LIPID PANEL
CHOLESTEROL: 214 mg/dL — AB (ref <200)
HDL: 98 mg/dL (ref >50)
LDL Cholesterol (Calc): 102 mg/dL (calc) — ABNORMAL HIGH
Non-HDL Cholesterol (Calc): 116 mg/dL (calc) (ref <130)
Total CHOL/HDL Ratio: 2.2 (calc) (ref <5.0)
Triglycerides: 49 mg/dL (ref <150)

## 2018-01-05 LAB — CBC WITH DIFFERENTIAL/PLATELET
BASOS ABS: 20 {cells}/uL (ref 0–200)
Basophils Relative: 0.8 %
EOS ABS: 60 {cells}/uL (ref 15–500)
Eosinophils Relative: 2.4 %
HCT: 37.3 % (ref 35.0–45.0)
HEMOGLOBIN: 12.7 g/dL (ref 11.7–15.5)
Lymphs Abs: 1058 cells/uL (ref 850–3900)
MCH: 30.2 pg (ref 27.0–33.0)
MCHC: 34 g/dL (ref 32.0–36.0)
MCV: 88.8 fL (ref 80.0–100.0)
MONOS PCT: 17.7 %
MPV: 10.1 fL (ref 7.5–12.5)
NEUTROS PCT: 36.8 %
Neutro Abs: 920 cells/uL — ABNORMAL LOW (ref 1500–7800)
Platelets: 337 10*3/uL (ref 140–400)
RBC: 4.2 10*6/uL (ref 3.80–5.10)
RDW: 12.8 % (ref 11.0–15.0)
Total Lymphocyte: 42.3 %
WBC mixed population: 443 cells/uL (ref 200–950)
WBC: 2.5 10*3/uL — ABNORMAL LOW (ref 3.8–10.8)

## 2018-01-05 LAB — HEMOGLOBIN A1C
EAG (MMOL/L): 6.6 (calc)
Hgb A1c MFr Bld: 5.8 % of total Hgb — ABNORMAL HIGH (ref <5.7)
Mean Plasma Glucose: 120 (calc)

## 2018-01-05 LAB — COMPREHENSIVE METABOLIC PANEL
AG RATIO: 1.8 (calc) (ref 1.0–2.5)
ALT: 23 U/L (ref 6–29)
AST: 31 U/L (ref 10–35)
Albumin: 4.4 g/dL (ref 3.6–5.1)
Alkaline phosphatase (APISO): 116 U/L (ref 33–130)
BUN: 19 mg/dL (ref 7–25)
CO2: 29 mmol/L (ref 20–32)
Calcium: 9.7 mg/dL (ref 8.6–10.4)
Chloride: 102 mmol/L (ref 98–110)
Creat: 0.89 mg/dL (ref 0.50–1.05)
GLOBULIN: 2.4 g/dL (ref 1.9–3.7)
GLUCOSE: 108 mg/dL — AB (ref 65–99)
POTASSIUM: 3.7 mmol/L (ref 3.5–5.3)
Sodium: 141 mmol/L (ref 135–146)
Total Bilirubin: 1.1 mg/dL (ref 0.2–1.2)
Total Protein: 6.8 g/dL (ref 6.1–8.1)

## 2018-01-09 ENCOUNTER — Ambulatory Visit: Payer: 59 | Admitting: Medical

## 2018-01-09 VITALS — BP 128/78 | HR 84 | Wt 175.6 lb

## 2018-01-09 DIAGNOSIS — E1169 Type 2 diabetes mellitus with other specified complication: Secondary | ICD-10-CM

## 2018-01-09 DIAGNOSIS — E785 Hyperlipidemia, unspecified: Secondary | ICD-10-CM | POA: Diagnosis not present

## 2018-01-09 DIAGNOSIS — E118 Type 2 diabetes mellitus with unspecified complications: Secondary | ICD-10-CM | POA: Diagnosis not present

## 2018-01-09 DIAGNOSIS — R635 Abnormal weight gain: Secondary | ICD-10-CM | POA: Diagnosis not present

## 2018-01-09 DIAGNOSIS — Z79899 Other long term (current) drug therapy: Secondary | ICD-10-CM | POA: Diagnosis not present

## 2018-01-09 DIAGNOSIS — I10 Essential (primary) hypertension: Secondary | ICD-10-CM

## 2018-01-09 DIAGNOSIS — D869 Sarcoidosis, unspecified: Secondary | ICD-10-CM

## 2018-01-09 MED ORDER — TRAMADOL HCL 50 MG PO TABS: 50.0000 mg | ORAL_TABLET | Freq: Two times a day (BID) | ORAL | 0 refills | Status: DC | PRN

## 2018-01-09 NOTE — Progress Notes (Signed)
Subjective: Chief Complaint  Patient presents with  . Diabetes    dm check,  discuss change meds for weight    Here for diabetes follow-up.  Medical team: Dr. Janae Sauce in [place], pulmonary specialist but at Sarcoidosis. Luther  Tysinger, Camelia Eng, PA-C here for primary care  Diabetes - Last visit with diabetes specialist was over a year ago.  Back last fall we stopped Xigduo due to recurrent yeast infection.  She has been back on metformin but feels like is not helping.  She is using NovoLog 70/30, using on average 10-20 u in mornings, 30u lunch, 20u QHS.  She came in recently for fasting labs here in anticipation of this visit.  Lately having cravings eating everything in sight. Glucose been running 150-260 fasting.  Hyperlipidemia - compliant with Pravachol 9m  Her recent blood count showed a decrease in neutrophils and white cells.  She reports that her sarcoidosis specialist has not mentioned this.  [place] U[place] and Imuran.  HTN - compliant with amlodipine 176mdaily and Lisinopril HCT daily.   She requests medications to help lose weight.  Past Medical History:  Diagnosis Date  . 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 Allergy   . Biceps tendonitis 2/12   Dr. GiGladstone Lighter. Diabetes mellitus    type II  . History of blood transfusion 1988   hysterectomy  . History of mammogram 05/25/12   stable, no suspicious finding, extremely dense breast tissue  . Hyperlipidemia   . Hypertension   . Neutropenia, unspecified ([place]   slight, likely ethnicity related  . Pneumococcal vaccine refused 03/2013  . Refused influenza vaccine 03/2013  . S/P hysterectomy   . Sarcoidosis   . Shortness of breath dyspnea    with exertion   . Supraspinatus tendonitis 2/12   Dr. GiGladstone Lighter. Wears glasses    Family  History  Problem Relation Age of Onset  . Heart disease Father 6533     died of MI, CABG   . Multiple sclerosis Sister   . Asthma Brother   . Cancer Paternal Grandmother        breast  . Diabetes Other        maternal and paternal grandmother  . Cancer Maternal Uncle        hematologic  . Hypertension Maternal Grandmother   . Cancer Maternal Grandfather        prostate  . Hypertension Maternal Grandfather   . Asthma Brother   . Eczema Sister   . Allergies Sister   . Healthy Daughter    Current Outpatient Medications on File Prior to Visit  Medication Sig Dispense Refill  . amLODipine (NORVASC) 10 MG tablet TAKE 1 TABLET BY MOUTH EVERY DAY 90 tablet 3  . azaTHIOprine (IMURAN) 50 MG tablet Take 50 mg by mouth daily.    . Marland KitchenAYER CONTOUR TEST test strip TEST BLOOD SUGAR TWICE DAILY 100 each 0  . BD PEN NEEDLE NANO U/F 32G X 4 MM MISC INJECT THREE TIMES DAILY AS DIRECTED 100 each 0  . blood glucose meter kit and supplies KIT Dispense based on patient and insurance preference. Use up to four times daily as directed. (FOR ICD-9 250.00, 250.01). 1 each 0  . Budesonide (UCERIS) 9 MG TB24 Take 1 tablet by mouth daily.    .Marland Kitchen  glucose blood (ONE TOUCH ULTRA TEST) test strip Use as instructed 100 each 12  . ibuprofen (ADVIL,MOTRIN) 800 MG tablet Take 1 tablet (800 mg total) 3 (three) times daily by mouth. 21 tablet 0  . Lancets 30G MISC Test blood sugars twice daily 100 each 2  . lisinopril-hydrochlorothiazide (PRINZIDE,ZESTORETIC) 20-25 MG tablet TAKE 1 TABLET BY MOUTH EVERY DAY 90 tablet 0  . metFORMIN (GLUCOPHAGE-XR) 500 MG 24 hr tablet TAKE 2 TABLETS(1000 MG) BY MOUTH TWICE DAILY 120 tablet 0  . NOVOLOG MIX 70/30 FLEXPEN (70-30) 100 UNIT/ML FlexPen INJECT 50 UNITS THREE TIMES DAILY WITH A MEAL 45 mL 0  . nystatin cream (MYCOSTATIN) Apply 1 application topically 2 (two) times daily. 30 g 2  . pravastatin (PRAVACHOL) 40 MG tablet TAKE 1 TABLET(40 MG) BY MOUTH DAILY 90 tablet 0   No current  facility-administered medications on file prior to visit.    No family hx/o thyroid bladder or kidney cancer   ROS as in subjective    Objective: BP 128/78   Pulse 84   Wt 175 lb 9.6 oz (79.7 kg)   LMP 12/27/1987   SpO2 99%   BMI 30.14 kg/m   Wt Readings from Last 3 Encounters:  01/09/18 175 lb 9.6 oz (79.7 kg)  10/31/17 165 lb (74.8 kg)  09/21/17 159 lb 12.8 oz (72.5 kg)   BP Readings from Last 3 Encounters:  01/09/18 128/78  10/31/17 (!) 150/79  09/21/17 126/70   General appearance: alert, no distress, WD/WN,  Oral cavity: MMM, no lesions Neck: supple, no lymphadenopathy, no thyromegaly, no masses, no bruits Heart: RRR, normal S1, S2, no murmurs Lungs: CTA bilaterally, no wheezes, rhonchi, or rales Abdomen: +bs, soft, mild RUQ tenderness, otherwise non tender, non distended, no masses, no hepatomegaly, no splenomegaly Pulses: 2+ symmetric, upper and lower extremities, normal cap refill Ext: no edema   Diabetic Foot Exam - Simple   Simple Foot Form Diabetic Foot exam was performed with the following findings:  Yes 01/09/2018  8:47 AM  Visual Inspection No deformities, no ulcerations, no other skin breakdown bilaterally:  Yes Sensation Testing Intact to touch and monofilament testing bilaterally:  Yes Pulse Check Posterior Tibialis and Dorsalis pulse intact bilaterally:  Yes Comments      Assessment: Encounter Diagnoses  Name Primary?  . Diabetes mellitus with complication ([place]) Yes  . Essential hypertension, benign   . Hyperlipidemia associated with type 2 diabetes mellitus ([place])   . Sarcoidosis   . Weight gain   . High risk medication use       Plan Decreased white blood cells and neutrophil counts-we will discuss with her specialist  She is on medications to help control the sarcoidosis but unfortunately these contribute to her appetite and weight gain.  We discussed the possibility of changing her medications that may help with appetite  suppression.  Her recent neutrophil count has decreased.  I will try to contact her sarcoid specialist to discuss medication options and the neutrophil count.  Hypertension-continue amlodipine 10 mg daily, lisinopril HCT 20/25 mg daily Hyperlipidemia-continue Pravachol 40 mg daily She can continue Ultram as needed for pain Continue NovoLog 70/30 as she is doing Sarcoidosis-Uceris and azathioprine managed by her specialist  We talked about diet, disciplined approach to diet to help prevent further weight gain, she needs to start exercising much more, continue daily foot checks, yearly eye doctor visit.  Regarding diabetes, weight loss, appetite suppression- consider phentermine, Qsymia, Ozempic

## 2018-01-09 NOTE — Patient Instructions (Signed)
Call insurance and check coverage for the following:  Qsymia weight loss pill  Saxenda weight loss shot  Ozempic diabetes shot

## 2018-01-10 ENCOUNTER — Encounter: Payer: Self-pay | Admitting: Medical

## 2018-01-10 ENCOUNTER — Telehealth: Payer: Self-pay | Admitting: Medical

## 2018-01-10 NOTE — Telephone Encounter (Signed)
[name]- please call Dr. Drue Stager office below, get their fax number and send a copy of my office note and labs.  Also I want to send a letter to Dr. Jeneen Rinks to get a response from him.  Dr. Janae Sauce in [place], pulmonary specialist but at Sarcoidosis. [phone]

## 2018-01-10 NOTE — Telephone Encounter (Signed)
Sent office notes, and letter, labs to Dr.James

## 2018-01-12 ENCOUNTER — Other Ambulatory Visit: Payer: Self-pay

## 2018-01-12 ENCOUNTER — Telehealth: Payer: Self-pay | Admitting: Medical

## 2018-01-12 MED ORDER — INSULIN ASPART PROT & ASPART (70-30 MIX) 100 UNIT/ML PEN: PEN_INJECTOR | SUBCUTANEOUS | 0 refills | Status: DC

## 2018-01-12 NOTE — Telephone Encounter (Signed)
Sent to pharmacy 

## 2018-01-12 NOTE — Telephone Encounter (Signed)
  Patient left message, she needs refill on her insulin

## 2018-01-15 NOTE — Telephone Encounter (Signed)
I called today, but the clinic at the medical center is closed in observance of [place] day.

## 2018-01-16 NOTE — Telephone Encounter (Signed)
Please call the specialist office again to see if her specialist had a chance to weight in on my medication concerns?  Haven't heard back from them.

## 2018-01-20 ENCOUNTER — Telehealth: Payer: Self-pay | Admitting: Medical

## 2018-01-20 NOTE — Telephone Encounter (Signed)
P.A. TRAMADOL  °

## 2018-01-20 NOTE — Telephone Encounter (Signed)
P.A. Cira Servant

## 2018-01-22 ENCOUNTER — Telehealth: Payer: Self-pay | Admitting: Family Medicine

## 2018-01-22 NOTE — Telephone Encounter (Signed)
[facility] req Novolog Mix 70/30 flxpen 6ml   Qty 45

## 2018-01-23 ENCOUNTER — Other Ambulatory Visit: Payer: Self-pay

## 2018-01-23 MED ORDER — INSULIN ASPART PROT & ASPART (70-30 MIX) 100 UNIT/ML PEN: PEN_INJECTOR | SUBCUTANEOUS | 1 refills | Status: DC

## 2018-01-23 NOTE — Telephone Encounter (Signed)
Sent referral to pharmacy

## 2018-01-25 NOTE — Telephone Encounter (Signed)
Called and spoke with dr.james office , they didn't not get the fax , verified the fax number and resend fax.

## 2018-01-25 NOTE — Telephone Encounter (Signed)
Forwarding to shane 

## 2018-01-26 ENCOUNTER — Other Ambulatory Visit: Payer: Self-pay | Admitting: Medical

## 2018-01-26 MED ORDER — SEMAGLUTIDE(0.25 OR 0.5MG/DOS) 2 MG/1.5ML ~~LOC~~ SOPN: 0.5000 mg | PEN_INJECTOR | SUBCUTANEOUS | 2 refills | Status: DC

## 2018-01-26 MED ORDER — PRAVASTATIN SODIUM 40 MG PO TABS: ORAL_TABLET | ORAL | 3 refills | Status: DC

## 2018-01-26 MED ORDER — LISINOPRIL-HYDROCHLOROTHIAZIDE 20-25 MG PO TABS: 1.0000 | ORAL_TABLET | Freq: Every day | ORAL | 3 refills | Status: DC

## 2018-01-26 MED ORDER — INSULIN PEN NEEDLE 32G X 4 MM MISC: 11 refills | Status: DC

## 2018-01-26 MED ORDER — METFORMIN HCL ER 500 MG PO TB24: ORAL_TABLET | ORAL | 2 refills | Status: DC

## 2018-01-26 NOTE — Telephone Encounter (Signed)
Called and notified pt of the medicine , pt said that she is going to call dr.james office to find out why no one has call us back.

## 2018-01-26 NOTE — Telephone Encounter (Signed)
Please let her know that I have been waiting over a week now to hear back from her specialist with no luck despite several phone calls  At this point continue current medications but I want to add a once weekly injection called Qzempic 0.25 mg once a week for the first 2 weeks then increase 0.5 mg weekly.  There will be markings on the pen device that shows 0.25 and and 5mg .  If she needs to come in first for demo with the nurse that is fine.  Have her check her sugars write them down and plan to recheck in a month and a half  I had mentioned last visit possibly having her see hematology.  Again we were waiting to hear back from her specialist on her blood counts.  Please make sure we try to get a copy of her labs to her specialist her sarcoidosis and if she does not have an answer from them within a week or 2 then we will refer her to hematology here in [facility]

## 2018-02-05 NOTE — Telephone Encounter (Signed)
P.A. Tramadol approved til 01/21/20, pt informed, pt has picked up medication

## 2018-02-05 NOTE — Telephone Encounter (Signed)
P.A. Approved til 01/20/19, pt informed, pt has picked up medication

## 2018-02-09 ENCOUNTER — Telehealth: Payer: Self-pay | Admitting: Medical

## 2018-02-09 NOTE — Telephone Encounter (Signed)
Bethany Webb from [facility] Dr. Jeneen Rinks sarcoid specialist office t# [phone] called & needs last [name], these were faxed to [phone]

## 2018-03-07 ENCOUNTER — Telehealth: Payer: Self-pay | Admitting: Medical

## 2018-03-07 NOTE — Telephone Encounter (Signed)
Pt dropped off FMLA forms to be completed by [facility] asap. Call pt when forms are completed.

## 2018-03-08 NOTE — Telephone Encounter (Signed)
Forward to shane

## 2018-03-08 NOTE — Telephone Encounter (Signed)
Called and spoke with pt she said that the FMLA is for when she goes to  See the specialist in [place]. She said that you have fill out for her in the past.

## 2018-03-15 ENCOUNTER — Ambulatory Visit: Payer: 59 | Admitting: Family Medicine

## 2018-03-15 ENCOUNTER — Encounter: Payer: Self-pay | Admitting: Family Medicine

## 2018-03-15 VITALS — BP 120/70 | HR 76 | Ht 64.0 in | Wt 171.2 lb

## 2018-03-15 DIAGNOSIS — T161XXA Foreign body in right ear, initial encounter: Secondary | ICD-10-CM | POA: Diagnosis not present

## 2018-03-15 NOTE — Patient Instructions (Signed)
Please avoid using q-tips in your ears.   Earwax Buildup, Adult The ears produce a substance called earwax that helps keep bacteria out of the ear and protects the skin in the ear canal. Occasionally, earwax can build up in the ear and cause discomfort or hearing loss. What increases the risk? This condition is more likely to develop in people who:  Are female.  Are elderly.  Naturally produce more earwax.  Clean their ears often with cotton swabs.  Use earplugs often.  Use in-ear headphones often.  Wear hearing aids.  Have narrow ear canals.  Have earwax that is overly thick or sticky.  Have eczema.  Are dehydrated.  Have excess hair in the ear canal.  What are the signs or symptoms? Symptoms of this condition include:  Reduced or muffled hearing.  A feeling of fullness in the ear or feeling that the ear is plugged.  Fluid coming from the ear.  Ear pain.  Ear itch.  Ringing in the ear.  Coughing.  An obvious piece of earwax that can be seen inside the ear canal.  How is this diagnosed? This condition may be diagnosed based on:  Your symptoms.  Your medical history.  An ear exam. During the exam, your health care provider will look into your ear with an instrument called an otoscope.  You may have tests, including a hearing test. How is this treated? This condition may be treated by:  Using ear drops to soften the earwax.  Having the earwax removed by a health care provider. The health care provider may: ? Flush the ear with water. ? Use an instrument that has a loop on the end (curette). ? Use a suction device.  Surgery to remove the wax buildup. This may be done in severe cases.  Follow these instructions at home:  Take over-the-counter and prescription medicines only as told by your health care provider.  Do not put any objects, including cotton swabs, into your ear. You can clean the opening of your ear canal with a washcloth or facial  tissue.  Follow instructions from your health care provider about cleaning your ears. Do not over-clean your ears.  Drink enough fluid to keep your urine clear or pale yellow. This will help to thin the earwax.  Keep all follow-up visits as told by your health care provider. If earwax builds up in your ears often or if you use hearing aids, consider seeing your health care provider for routine, preventive ear cleanings. Ask your health care provider how often you should schedule your cleanings.  If you have hearing aids, clean them according to instructions from the manufacturer and your health care provider. Contact a health care provider if:  You have ear pain.  You develop a fever.  You have blood, pus, or other fluid coming from your ear.  You have hearing loss.  You have ringing in your ears that does not go away.  Your symptoms do not improve with treatment.  You feel like the room is spinning (vertigo). Summary  Earwax can build up in the ear and cause discomfort or hearing loss.  The most common symptoms of this condition include reduced or muffled hearing and a feeling of fullness in the ear or feeling that the ear is plugged.  This condition may be diagnosed based on your symptoms, your medical history, and an ear exam.  This condition may be treated by using ear drops to soften the earwax or by having the  earwax removed by a health care provider.  Do not put any objects, including cotton swabs, into your ear. You can clean the opening of your ear canal with a washcloth or facial tissue. This information is not intended to replace advice given to you by your health care provider. Make sure you discuss any questions you have with your health care provider. Document Released: 01/19/2005 Document Revised: 02/22/2017 Document Reviewed: 02/22/2017 Elsevier Interactive Patient Education  [employer].

## 2018-03-15 NOTE — Progress Notes (Signed)
Chief Complaint  Patient presents with  . Ear Problem    right ear has cotton stuck in it.    This morning she was cleaning her ears with olive oil and Q-tip.  It came out without the cotton tip from the right ear. She tried tweezers, but only could get "strands" out. Went to [facility], but they didn't have the correct equipment to remove it. She presents today to have the cottonball removed from her right ear.   PMH, PSH, SH and meds reviewed Allergies  Allergen Reactions  . Codeine Nausea Only  . Metformin And Related     Diarrhea, nausea   ROS: She denies pain, fever, URI symptoms or other complaints  PHYSICAL EXAM:  BP 120/70   Pulse 76   Ht 5\' 4"  (1.626 m)   Wt 171 lb 3.2 oz (77.7 kg)   LMP 12/27/1987   BMI 29.39 kg/m   Well developed, well-appearing female in no distress HEENT: conjunctiva and sclera are clear.  L EAC and TM normal.  Right--unable to see TM due to cotton in the canal. OP clear Neck: no lymphadenopathy or mass  Procedure:  Alligator forceps were used to remove the cotton ball.  Came out in two attempts (first attempt removed some, but the second attempted removed the large piece). After removal, TM and EAC were viewed to be normal Patient tolerated procedure well   ASSESSMENT/PLAN:  Foreign body of right ear, initial encounter - Plan: PR REMV EXT CANAL FOREIGN BODY  We discussed not using q-tips, how to safely clean ears, and potential risks from q-tips.

## 2018-03-16 ENCOUNTER — Encounter: Payer: Self-pay | Admitting: Family Medicine

## 2018-04-09 DIAGNOSIS — D869 Sarcoidosis, unspecified: Secondary | ICD-10-CM | POA: Diagnosis not present

## 2018-04-09 DIAGNOSIS — R109 Unspecified abdominal pain: Secondary | ICD-10-CM | POA: Diagnosis not present

## 2018-04-09 DIAGNOSIS — R1011 Right upper quadrant pain: Secondary | ICD-10-CM | POA: Diagnosis not present

## 2018-04-17 ENCOUNTER — Other Ambulatory Visit: Payer: Self-pay | Admitting: Medical

## 2018-04-27 ENCOUNTER — Other Ambulatory Visit: Payer: Self-pay | Admitting: Medical

## 2018-04-30 ENCOUNTER — Ambulatory Visit: Payer: 59 | Admitting: Medical

## 2018-04-30 ENCOUNTER — Encounter: Payer: Self-pay | Admitting: Medical

## 2018-04-30 VITALS — BP 110/82 | HR 84 | Temp 98.2°F | Ht 63.5 in | Wt 171.4 lb

## 2018-04-30 DIAGNOSIS — Z79899 Other long term (current) drug therapy: Secondary | ICD-10-CM

## 2018-04-30 DIAGNOSIS — D862 Sarcoidosis of lung with sarcoidosis of lymph nodes: Secondary | ICD-10-CM

## 2018-04-30 DIAGNOSIS — I10 Essential (primary) hypertension: Secondary | ICD-10-CM

## 2018-04-30 DIAGNOSIS — D869 Sarcoidosis, unspecified: Secondary | ICD-10-CM

## 2018-04-30 DIAGNOSIS — R202 Paresthesia of skin: Secondary | ICD-10-CM | POA: Diagnosis not present

## 2018-04-30 DIAGNOSIS — E118 Type 2 diabetes mellitus with unspecified complications: Secondary | ICD-10-CM | POA: Diagnosis not present

## 2018-04-30 MED ORDER — GABAPENTIN 100 MG PO CAPS: 100.0000 mg | ORAL_CAPSULE | Freq: Two times a day (BID) | ORAL | 1 refills | Status: DC

## 2018-04-30 MED ORDER — INSULIN ASPART PROT & ASPART (70-30 MIX) 100 UNIT/ML PEN: PEN_INJECTOR | SUBCUTANEOUS | 3 refills | Status: DC

## 2018-04-30 MED ORDER — VITAMIN D 1000 UNITS PO TABS: 1000.0000 [IU] | ORAL_TABLET | Freq: Every day | ORAL | 3 refills | Status: AC

## 2018-04-30 MED ORDER — SEMAGLUTIDE (1 MG/DOSE) 2 MG/1.5ML ~~LOC~~ SOPN: 1.0000 mg | PEN_INJECTOR | SUBCUTANEOUS | 11 refills | Status: DC

## 2018-04-30 MED ORDER — METFORMIN HCL ER 500 MG PO TB24: 500.0000 mg | ORAL_TABLET | Freq: Every day | ORAL | 0 refills | Status: DC

## 2018-04-30 NOTE — Patient Instructions (Addendum)
   I am increasing the Ozempic to the 1mg  weekly injection  Continue the Metformin as you are doing   Novolog 70/30, continue 12 units in the morning, but increase to 35 units at lunch and 35-40 units at dinner.    Begin Gabapentin 100mg  for neuropathic pain.   You can start 1 capsule daily at bedtime.   After a few weeks if this isn't helping, you can increase to 2 capsules at bedtime.     Restart Vitamin D 1000 daily.

## 2018-04-30 NOTE — Progress Notes (Signed)
Subjective: Chief Complaint  Patient presents with  . Foot Swelling    bilateral with tingling and burning x1 week/swelling x1 day-started steriods x3weeks    She has hx/o sarcoidosis, diabetes, HTN, hyperlipidemia, vit D deficiency.   Started having foot tingling and burning a week ago, but been keeping her up a night.   Feet felt swollen last night.     Checking glucose, running 250-high 200s in the morning.  Taking Metformin XR 547m 1 tablet BID and Novolog mix 70/30, 12u in morning and takes 30u around lunch time.   Taking 30u around 6pm.   Glucose in morning in the 80s or 100s.   Seeing sugars 150 or so before lunch.   Getting 250s or higher in evening before dinner.  Taking Ozempic once weekly.  Back on prednisone daily since April from sarcoidosis specialist.  She notes specialist said Sarcoid is doing ok but felt she needed to be on another round of steroids until probably August 2019.   Since last visit here had MRI and some other tests with specialist.  No CP, no dyspnea.    Past Medical History:  Diagnosis Date  . 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 Allergy   . Biceps tendonitis 2/12   Dr. GGladstone Lighter . Diabetes mellitus    type II  . History of blood transfusion 1988   hysterectomy  . History of mammogram 05/25/12   stable, no suspicious finding, extremely dense breast tissue  . Hyperlipidemia   . Hypertension   . Neutropenia, unspecified ([place] 02/2013   slight, likely ethnicity related  . Pneumococcal vaccine refused 03/2013  . Refused influenza vaccine 03/2013  . S/P hysterectomy   . Sarcoidosis   . Shortness of breath dyspnea    with exertion   . Supraspinatus tendonitis 2/12   Dr. GGladstone Lighter . Wears glasses    Current Outpatient Medications on File Prior to Visit  Medication Sig Dispense Refill  . amLODipine (NORVASC) 10 MG tablet TAKE 1 TABLET BY MOUTH EVERY DAY 90 tablet 3  .  azaTHIOprine (IMURAN) 50 MG tablet Take 50 mg by mouth daily.    .Marland KitchenBAYER CONTOUR TEST test strip TEST BLOOD SUGAR TWICE DAILY 100 each 0  . blood glucose meter kit and supplies KIT Dispense based on patient and insurance preference. Use up to four times daily as directed. (FOR ICD-9 250.00, 250.01). 1 each 0  . glucose blood (ONE TOUCH ULTRA TEST) test strip Use as instructed 100 each 12  . ibuprofen (ADVIL,MOTRIN) 800 MG tablet Take 1 tablet (800 mg total) 3 (three) times daily by mouth. 21 tablet 0  . Insulin Pen Needle (BD PEN NEEDLE NANO U/F) 32G X 4 MM MISC INJECT THREE TIMES DAILY AS DIRECTED 100 each 11  . Lancets 30G MISC Test blood sugars twice daily 100 each 2  . lisinopril-hydrochlorothiazide (PRINZIDE,ZESTORETIC) 20-25 MG tablet Take 1 tablet by mouth daily. 90 tablet 3  . nystatin cream (MYCOSTATIN) Apply 1 application topically 2 (two) times daily. 30 g 2  . pravastatin (PRAVACHOL) 40 MG tablet TAKE 1 TABLET(40 MG) BY MOUTH DAILY 90 tablet 3  . predniSONE (DELTASONE) 10 MG tablet Take 1 tab by mouth 2 times daily for 7 days, then 1 tab daily for 14 days, then 1/2 tab daily for 14 days    . traMADol (ULTRAM) 50 MG tablet Take 1 tablet (50 mg total) by  mouth every 12 (twelve) hours as needed (pain). 45 tablet 0  . Budesonide (UCERIS) 9 MG TB24 Take 1 tablet by mouth daily.     No current facility-administered medications on file prior to visit.    ROS as in subjective   Objective: BP 110/82   Pulse 84   Temp 98.2 F (36.8 C) (Oral)   Ht 5' 3.5" (1.613 m)   Wt 171 lb 6.4 oz (77.7 kg)   LMP 12/27/1987   SpO2 98%   BMI 29.89 kg/m   Gen: wd, wn, nad Pedal pulses 2+ No swelling today LE Tender throughout feet  Diabetic Foot Exam - Simple   Simple Foot Form Diabetic Foot exam was performed with the following findings:  Yes 04/30/2018  9:48 AM  Visual Inspection No deformities, no ulcerations, no other skin breakdown bilaterally:  Yes Sensation Testing Intact to touch and  monofilament testing bilaterally:  Yes Pulse Check Posterior Tibialis and Dorsalis pulse intact bilaterally:  Yes Comments Tender throughout both feet, no swelling      Assessment: Encounter Diagnoses  Name Primary?  . 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 Essential hypertension, benign   . Diabetes mellitus with complication ([place])   . Sarcoidosis   . High risk medication use   . Paresthesia     Plan: Discussed her new symptoms.   She is back on prednisone long term for sarcoidosis, is having food cravings, sugars running high.    Medication changes as below today  Call back in 1-2 week with symptoms update and sugar readings.  Patient Instructions   I am increasing the Ozempic to the 63m weekly injection  Continue the Metformin as you are doing   Novolog 70/30, continue 12 units in the morning, but increase to 35 units at lunch and 35-40 units at dinner.    Begin Gabapentin 1064mfor neuropathic pain.   You can start 1 capsule daily at bedtime.   After a few weeks if this isn't helping, you can increase to 2 capsules at bedtime.     Restart Vitamin D 1000 daily.   Bethany Webb seen today for foot swelling.  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 ([place] Essential hypertension, benign  Diabetes mellitus with complication ([place] Sarcoidosis  High risk medication use  Paresthesia  Other orders -     metFORMIN (GLUCOPHAGE-XR) 500 MG 24 hr tablet; Take 1 tablet (500 mg total) by mouth daily with breakfast. 2 tablets daily -     Semaglutide (OZEMPIC) 1 MG/DOSE SOPN; Inject 1 mg into the skin once a week. -     gabapentin (NEURONTIN) 100 MG capsule; Take 1 capsule (100 mg total) by mouth 2 (two) times daily. -     insulin aspart protamine - aspart (NOVOLOG MIX 70/30 FLEXPEN) (70-30) 100 UNIT/ML FlexPen; INJECT 50 UNITS THREE TIMES DAILY WITH A MEAL -     cholecalciferol (VITAMIN D)  1000 units tablet; Take 1 tablet (1,000 Units total) by mouth daily.

## 2018-05-07 ENCOUNTER — Other Ambulatory Visit: Payer: Self-pay | Admitting: Medical

## 2018-05-07 ENCOUNTER — Telehealth: Payer: Self-pay | Admitting: Medical

## 2018-05-07 MED ORDER — INSULIN LISPRO PROT & LISPRO (75-25 MIX) 100 UNIT/ML KWIKPEN: 50.0000 [IU] | PEN_INJECTOR | Freq: Three times a day (TID) | SUBCUTANEOUS | 2 refills | Status: DC

## 2018-05-07 NOTE — Telephone Encounter (Signed)
Spoke to pt and [facility] who state that Novolog is no longer covered by her insurance. Humalog is the preferred drug that is covered.

## 2018-05-07 NOTE — Telephone Encounter (Signed)
I changed to Humalog mix for when she runs out of novolog

## 2018-05-10 ENCOUNTER — Telehealth (HOSPITAL_COMMUNITY): Payer: Self-pay

## 2018-05-10 NOTE — Telephone Encounter (Signed)
Attempted to call patient in regards to Pulmonary Rehab - lm on vm °

## 2018-05-16 ENCOUNTER — Telehealth (HOSPITAL_COMMUNITY): Payer: Self-pay | Admitting: *Deleted

## 2018-05-16 NOTE — Telephone Encounter (Signed)
Called and left message for pt to please return call for scheduling for Pulmonary rehab. Cherre Huger, BSN Cardiac and Training and development officer

## 2018-05-18 ENCOUNTER — Other Ambulatory Visit: Payer: Self-pay | Admitting: Medical

## 2018-05-19 ENCOUNTER — Other Ambulatory Visit: Payer: Self-pay | Admitting: Medical

## 2018-06-04 ENCOUNTER — Encounter: Payer: Self-pay | Admitting: Medical

## 2018-06-04 ENCOUNTER — Ambulatory Visit: Payer: 59 | Admitting: Medical

## 2018-06-04 VITALS — BP 140/78 | HR 98 | Temp 97.8°F | Ht 63.5 in | Wt 173.6 lb

## 2018-06-04 DIAGNOSIS — E785 Hyperlipidemia, unspecified: Secondary | ICD-10-CM | POA: Diagnosis not present

## 2018-06-04 DIAGNOSIS — D869 Sarcoidosis, unspecified: Secondary | ICD-10-CM

## 2018-06-04 DIAGNOSIS — D862 Sarcoidosis of lung with sarcoidosis of lymph nodes: Secondary | ICD-10-CM

## 2018-06-04 DIAGNOSIS — I10 Essential (primary) hypertension: Secondary | ICD-10-CM

## 2018-06-04 DIAGNOSIS — E118 Type 2 diabetes mellitus with unspecified complications: Secondary | ICD-10-CM

## 2018-06-04 DIAGNOSIS — E1169 Type 2 diabetes mellitus with other specified complication: Secondary | ICD-10-CM | POA: Diagnosis not present

## 2018-06-04 DIAGNOSIS — R1011 Right upper quadrant pain: Secondary | ICD-10-CM

## 2018-06-04 LAB — POCT URINALYSIS DIP (PROADVANTAGE DEVICE)
BILIRUBIN UA: NEGATIVE
BILIRUBIN UA: NEGATIVE mg/dL
GLUCOSE UA: NEGATIVE mg/dL
Leukocytes, UA: NEGATIVE
Nitrite, UA: NEGATIVE
Protein Ur, POC: NEGATIVE mg/dL
RBC UA: NEGATIVE
pH, UA: 6.5 (ref 5.0–8.0)

## 2018-06-04 LAB — CBC WITH DIFFERENTIAL/PLATELET
BASOS: 0 %
Basophils Absolute: 0 10*3/uL (ref 0.0–0.2)
EOS (ABSOLUTE): 0.1 10*3/uL (ref 0.0–0.4)
Eos: 3 %
HEMATOCRIT: 37.6 % (ref 34.0–46.6)
Hemoglobin: 12.9 g/dL (ref 11.1–15.9)
LYMPHS ABS: 1.1 10*3/uL (ref 0.7–3.1)
LYMPHS: 26 %
MCH: 30.1 pg (ref 26.6–33.0)
MCHC: 34.3 g/dL (ref 31.5–35.7)
MCV: 88 fL (ref 79–97)
MONOCYTES: 13 %
Monocytes Absolute: 0.5 10*3/uL (ref 0.1–0.9)
Neutrophils Absolute: 2.3 10*3/uL (ref 1.4–7.0)
Neutrophils: 58 %
PLATELETS: 304 10*3/uL (ref 150–450)
RBC: 4.29 x10E6/uL (ref 3.77–5.28)
RDW: 14.3 % (ref 12.3–15.4)
WBC: 4 10*3/uL (ref 3.4–10.8)

## 2018-06-04 LAB — COMPREHENSIVE METABOLIC PANEL
A/G RATIO: 1.7 (ref 1.2–2.2)
ALBUMIN: 4.5 g/dL (ref 3.5–5.5)
ALT: 27 IU/L (ref 0–32)
AST: 29 IU/L (ref 0–40)
Alkaline Phosphatase: 152 IU/L — ABNORMAL HIGH (ref 39–117)
BILIRUBIN TOTAL: 0.8 mg/dL (ref 0.0–1.2)
BUN / CREAT RATIO: 20 (ref 9–23)
BUN: 18 mg/dL (ref 6–24)
CALCIUM: 9.7 mg/dL (ref 8.7–10.2)
CHLORIDE: 99 mmol/L (ref 96–106)
CO2: 31 mmol/L — ABNORMAL HIGH (ref 20–29)
Creatinine, Ser: 0.9 mg/dL (ref 0.57–1.00)
GFR, EST AFRICAN AMERICAN: 81 mL/min/{1.73_m2} (ref >59)
GFR, EST NON AFRICAN AMERICAN: 70 mL/min/{1.73_m2} (ref >59)
GLUCOSE: 100 mg/dL — AB (ref 65–99)
Globulin, Total: 2.6 g/dL (ref 1.5–4.5)
Potassium: 3.2 mmol/L — ABNORMAL LOW (ref 3.5–5.2)
Sodium: 141 mmol/L (ref 134–144)
Total Protein: 7.1 g/dL (ref 6.0–8.5)

## 2018-06-04 LAB — LIPASE: Lipase: 142 U/L — ABNORMAL HIGH (ref 14–72)

## 2018-06-04 MED ORDER — HYDROCODONE-ACETAMINOPHEN 7.5-325 MG PO TABS
1.0000 | ORAL_TABLET | Freq: Four times a day (QID) | ORAL | 0 refills | Status: AC | PRN
Start: 2018-06-04 — End: 2018-06-09

## 2018-06-04 NOTE — Progress Notes (Signed)
Subjective: Chief Complaint  Patient presents with  . Numbness    neck,arms,legs were numb this morning, they are now tingling    Here for flare up of sarcoidosis.    Last saw specialist for sarcoid in April.    Has had worse pain the past week.  Both arms numb this morning, legs felt numb, back of neck numb.  This morning was only time recently with numbness, but had severe pain in side for over a week.   Has had right sided pain x 1 week.   No urinary or bowel change, no blood in stool, no blood in urine, no burning with urination, no recent trauma or fall.  Pain with motion, pain with walking.    Was on step down taper with prednisone but last 1/2 dose is today.   Was taken off Uceris at last visit.   Tramadol makes her sleepy, doesn't seem to help pain.   Uses Ibuprofen and CBD oil at times.  She had started gabapentin but after 2 days it was making her sick feeling?  Sugars last few days 55-60 in mornings, taking Ozempic mondays, taking Humalog, metformin.  Compliant with BP and cholesterol medications   Past Medical History:  Diagnosis Date  . 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 Allergy   . Biceps tendonitis 2/12   Dr. Gladstone Lighter  . Diabetes mellitus    type II  . History of blood transfusion 1988   hysterectomy  . History of mammogram 05/25/12   stable, no suspicious finding, extremely dense breast tissue  . Hyperlipidemia   . Hypertension   . Neutropenia, unspecified ([place]) 02/2013   slight, likely ethnicity related  . Pneumococcal vaccine refused 03/2013  . Refused influenza vaccine 03/2013  . S/P hysterectomy   . Sarcoidosis   . Shortness of breath dyspnea    with exertion   . Supraspinatus tendonitis 2/12   Dr. Gladstone Lighter  . Wears glasses    Current Outpatient Medications on File Prior to Visit  Medication Sig Dispense Refill  . amLODipine (NORVASC) 10 MG tablet TAKE 1 TABLET BY MOUTH  EVERY DAY 90 tablet 0  . azaTHIOprine (IMURAN) 50 MG tablet Take 50 mg by mouth daily.    Marland Kitchen BAYER CONTOUR TEST test strip TEST BLOOD SUGAR TWICE DAILY 100 each 0  . blood glucose meter kit and supplies KIT Dispense based on patient and insurance preference. Use up to four times daily as directed. (FOR ICD-9 250.00, 250.01). 1 each 0  . glucose blood (ONE TOUCH ULTRA TEST) test strip Use as instructed 100 each 12  . ibuprofen (ADVIL,MOTRIN) 800 MG tablet Take 1 tablet (800 mg total) 3 (three) times daily by mouth. 21 tablet 0  . Insulin Lispro Prot & Lispro (HUMALOG MIX 75/25 KWIKPEN) (75-25) 100 UNIT/ML Kwikpen Inject 50 Units into the skin 3 (three) times daily. 15 mL 2  . Insulin Pen Needle (BD PEN NEEDLE NANO U/F) 32G X 4 MM MISC INJECT THREE TIMES DAILY AS DIRECTED 100 each 11  . Lancets 30G MISC Test blood sugars twice daily 100 each 2  . lisinopril-hydrochlorothiazide (PRINZIDE,ZESTORETIC) 20-25 MG tablet Take 1 tablet by mouth daily. 90 tablet 3  . metFORMIN (GLUCOPHAGE-XR) 500 MG 24 hr tablet Take 1 tablet (500 mg total) by mouth daily with breakfast. 2 tablets daily 180 tablet 0  . metFORMIN (GLUCOPHAGE-XR) 500 MG 24 hr tablet TAKE 1 TABLETS(1000  MG) BY MOUTH TWICE DAILY 180 tablet 1  . nystatin cream (MYCOSTATIN) Apply 1 application topically 2 (two) times daily. 30 g 2  . pravastatin (PRAVACHOL) 40 MG tablet TAKE 1 TABLET(40 MG) BY MOUTH DAILY 90 tablet 3  . Semaglutide (OZEMPIC) 1 MG/DOSE SOPN Inject 1 mg into the skin once a week. 1.5 mL 11  . cholecalciferol (VITAMIN D) 1000 units tablet Take 1 tablet (1,000 Units total) by mouth daily. (Patient not taking: Reported on 06/04/2018) 90 tablet 3   No current facility-administered medications on file prior to visit.    Past Surgical History:  Procedure Laterality Date  . ABDOMINAL HYSTERECTOMY  1990   and BSO; endometriosis  . ANTERIOR CERVICAL DECOMP/DISCECTOMY FUSION N/A 06/20/2013   Procedure: ANTERIOR CERVICAL  DECOMPRESSION/DISCECTOMY FUSION 1 LEVEL;  Surgeon: Ophelia Charter, MD;  Location: [facility];  Service: Neurosurgery;  Laterality: N/A;  C45 anterior cervical decompression with fusion interbody prothesis plating and bonegraft  . BREAST REDUCTION SURGERY  1994  . CARPAL TUNNEL RELEASE Right 06/20/2013   Procedure: CARPAL TUNNEL RELEASE;  Surgeon: Ophelia Charter, MD;  Location: [facility];  Service: Neurosurgery;  Laterality: Right;  RIGHT carpal tunnel release  . COLONOSCOPY  03/06/12   sigmoid polyp; repeat 10 years; Dr. Tora Duck  . LUNG SURGERY  1987   endometriosis in lungs  . SUPRACLAVICAL NODE BIOPSY Left 01/14/2016   Procedure: LEFT SUPRACLAVICAL NODE BIOPSY;  Surgeon: Melrose Nakayama, MD;  Location: [facility];  Service: Thoracic;  Laterality: Left;     ROS as in subjective  Objective: BP 140/78   Pulse 98   Temp 97.8 F (36.6 C) (Oral)   Ht 5' 3.5" (1.613 m)   Wt 173 lb 9.6 oz (78.7 kg)   LMP 12/27/1987   SpO2 99%   BMI 30.27 kg/m   Gen: wd, wn, seems to be in pain Skin: unremarkable , no rash Lungs clear Heart rrr, normal s1, s2, no murmurs Tender along right mid to low back throughout Abdomen: +bs, soft, tender right upper and right mid abdomen, no rebound, no swelling, no distention Ext: no edema UE and LE nontender, no swelling    Assessment: Encounter Diagnoses  Name Primary?  . 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 Essential hypertension, benign   . Sarcoidosis   . Diabetes mellitus with complication ([place])   . Hyperlipidemia associated with type 2 diabetes mellitus ([place])   . RUQ pain      Plan:  Likely symptoms related to sarcoidosis flare, but can't rule out other.  STAT labs today.  UA reviewed.  Pain medication as below  advised she cut back 1-2 units per meal on meal time insulin given recent lower glucose readings.   We may end up trying back on Gabapentin although she didn't feel good on it.  She just  tried this 2 days.  She just ended her prednisone taper.   She continues on Azathioprine  F/u pending labs   Bethany Webb was seen today for numbness.  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 ([place]) -     Comprehensive metabolic panel -     CBC with Differential/Platelet -     Lipase  Essential hypertension, benign  Sarcoidosis -     Comprehensive metabolic panel -     CBC with Differential/Platelet -     Lipase  Diabetes mellitus with complication ([place]) -  Comprehensive metabolic panel -     POCT Urinalysis DIP (Proadvantage Device)  Hyperlipidemia associated with type 2 diabetes mellitus ([facility])  RUQ pain -     Comprehensive metabolic panel -     CBC with Differential/Platelet -     Lipase  Other orders -     HYDROcodone-acetaminophen (NORCO) 7.5-325 MG tablet; Take 1 tablet by mouth every 6 (six) hours as needed for up to 5 days for moderate pain.

## 2018-06-05 ENCOUNTER — Telehealth (HOSPITAL_COMMUNITY): Payer: Self-pay

## 2018-06-05 NOTE — Telephone Encounter (Signed)
3rd attempt to contact patient in regards to Pulmonary Rehab - lm on vm °

## 2018-06-08 ENCOUNTER — Ambulatory Visit: Payer: 59 | Admitting: Medical

## 2018-06-08 ENCOUNTER — Ambulatory Visit
Admission: RE | Admit: 2018-06-08 | Discharge: 2018-06-08 | Disposition: A | Discharge status: Home / Self Care | Payer: 59 | Source: Ambulatory Visit | Attending: Medical | Admitting: Medical

## 2018-06-08 VITALS — BP 128/82 | HR 90 | Temp 98.0°F | Wt 170.8 lb

## 2018-06-08 DIAGNOSIS — R748 Abnormal levels of other serum enzymes: Secondary | ICD-10-CM | POA: Diagnosis not present

## 2018-06-08 DIAGNOSIS — R109 Unspecified abdominal pain: Secondary | ICD-10-CM

## 2018-06-08 DIAGNOSIS — M5410 Radiculopathy, site unspecified: Secondary | ICD-10-CM

## 2018-06-08 DIAGNOSIS — R110 Nausea: Secondary | ICD-10-CM

## 2018-06-08 DIAGNOSIS — M545 Low back pain: Secondary | ICD-10-CM | POA: Diagnosis not present

## 2018-06-08 DIAGNOSIS — M5440 Lumbago with sciatica, unspecified side: Secondary | ICD-10-CM | POA: Diagnosis not present

## 2018-06-08 NOTE — Progress Notes (Signed)
Subjective: Chief Complaint  Patient presents with  . other    lower back pain started on her right now in the middle of back   Here for recheck.  I saw her 4 days ago.  Now she reports more pain in low back and radiating down legs.   Pain in side is still there (right side), but that is a little improved.   Still having back pain.  Lower back pain in the middle, right side pain, nausea, side pain improved a little.  Urination is normal, no changes, no discoloration.  No bowel change, no bowel color changes.    Having some paresthesias in legs.  No blood in urine or stool.  No fever.   No vomiting, but still has nausea.    When I saw her 4 days ago, she notes pains in back, side, legs, arms numb this morning, legs felt numb, back of neck numb.   She reported pain with motion, pain with walking.    Sees sarcoidosis specialist.   Was on step down taper with prednisone but last 1/2 dose is today.   Was taken off Uceris at last visit.   Tramadol makes her sleepy, doesn't seem to help pain.   Uses Ibuprofen and CBD oil at times.  She had started gabapentin but after 2 days it was making her sick feeling?  Sugars last few days 55-60 in mornings, taking Ozempic mondays, taking Humalog, metformin.  Compliant with BP and cholesterol medications   Past Medical History:  Diagnosis Date  . 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 Allergy   . Biceps tendonitis 2/12   Dr. Gladstone Lighter  . Diabetes mellitus    type II  . History of blood transfusion 1988   hysterectomy  . History of mammogram 05/25/12   stable, no suspicious finding, extremely dense breast tissue  . Hyperlipidemia   . Hypertension   . Neutropenia, unspecified ([place]) 02/2013   slight, likely ethnicity related  . Pneumococcal vaccine refused 03/2013  . Refused influenza vaccine 03/2013  . S/P hysterectomy   . Sarcoidosis   . Shortness of breath dyspnea    with  exertion   . Supraspinatus tendonitis 2/12   Dr. Gladstone Lighter  . Wears glasses    Current Outpatient Medications on File Prior to Visit  Medication Sig Dispense Refill  . amLODipine (NORVASC) 10 MG tablet TAKE 1 TABLET BY MOUTH EVERY DAY 90 tablet 0  . azaTHIOprine (IMURAN) 50 MG tablet Take 50 mg by mouth daily.    Marland Kitchen BAYER CONTOUR TEST test strip TEST BLOOD SUGAR TWICE DAILY 100 each 0  . blood glucose meter kit and supplies KIT Dispense based on patient and insurance preference. Use up to four times daily as directed. (FOR ICD-9 250.00, 250.01). 1 each 0  . glucose blood (ONE TOUCH ULTRA TEST) test strip Use as instructed 100 each 12  . Insulin Lispro Prot & Lispro (HUMALOG MIX 75/25 KWIKPEN) (75-25) 100 UNIT/ML Kwikpen Inject 50 Units into the skin 3 (three) times daily. 15 mL 2  . Insulin Pen Needle (BD PEN NEEDLE NANO U/F) 32G X 4 MM MISC INJECT THREE TIMES DAILY AS DIRECTED 100 each 11  . Lancets 30G MISC Test blood sugars twice daily 100 each 2  . lisinopril-hydrochlorothiazide (PRINZIDE,ZESTORETIC) 20-25 MG tablet Take 1 tablet by mouth daily. 90 tablet 3  . metFORMIN (GLUCOPHAGE-XR) 500 MG 24 hr tablet Take  1 tablet (500 mg total) by mouth daily with breakfast. 2 tablets daily 180 tablet 0  . nystatin cream (MYCOSTATIN) Apply 1 application topically 2 (two) times daily. 30 g 2  . pravastatin (PRAVACHOL) 40 MG tablet TAKE 1 TABLET(40 MG) BY MOUTH DAILY 90 tablet 3  . Semaglutide (OZEMPIC) 1 MG/DOSE SOPN Inject 1 mg into the skin once a week. 1.5 mL 11  . cholecalciferol (VITAMIN D) 1000 units tablet Take 1 tablet (1,000 Units total) by mouth daily. (Patient not taking: Reported on 06/04/2018) 90 tablet 3  . HYDROcodone-acetaminophen (NORCO) 7.5-325 MG tablet Take 1 tablet by mouth every 6 (six) hours as needed for up to 5 days for moderate pain. (Patient not taking: Reported on 06/08/2018) 15 tablet 0  . ibuprofen (ADVIL,MOTRIN) 800 MG tablet Take 1 tablet (800 mg total) 3 (three) times daily  by mouth. (Patient not taking: Reported on 06/08/2018) 21 tablet 0   No current facility-administered medications on file prior to visit.    Past Surgical History:  Procedure Laterality Date  . ABDOMINAL HYSTERECTOMY  1990   and BSO; endometriosis  . ANTERIOR CERVICAL DECOMP/DISCECTOMY FUSION N/A 06/20/2013   Procedure: ANTERIOR CERVICAL DECOMPRESSION/DISCECTOMY FUSION 1 LEVEL;  Surgeon: Ophelia Charter, MD;  Location: [facility];  Service: Neurosurgery;  Laterality: N/A;  C45 anterior cervical decompression with fusion interbody prothesis plating and bonegraft  . BREAST REDUCTION SURGERY  1994  . CARPAL TUNNEL RELEASE Right 06/20/2013   Procedure: CARPAL TUNNEL RELEASE;  Surgeon: Ophelia Charter, MD;  Location: [facility];  Service: Neurosurgery;  Laterality: Right;  RIGHT carpal tunnel release  . COLONOSCOPY  03/06/12   sigmoid polyp; repeat 10 years; Dr. Tora Duck  . LUNG SURGERY  1987   endometriosis in lungs  . SUPRACLAVICAL NODE BIOPSY Left 01/14/2016   Procedure: LEFT SUPRACLAVICAL NODE BIOPSY;  Surgeon: Melrose Nakayama, MD;  Location: [facility];  Service: Thoracic;  Laterality: Left;   ROS as in subjective    Objective: BP 128/82 (BP Location: Left Arm, Patient Position: Sitting)   Pulse 90   Temp 98 F (36.7 C)   Wt 170 lb 12.8 oz (77.5 kg)   LMP 12/27/1987   SpO2 99%   BMI 29.78 kg/m   Gen: wd, wn, seems to be in pain Skin: unremarkable , no rash Lungs clear Heart rrr, normal s1, s2, no murmurs Tender along right mid to low back throughout, mild pain with flexion and extension which is limited due to pain Abdomen: +bs, soft, tender right abdomen throughout, no rebound, no swelling, no distention Ext: no edema UE and LE nontender, no swelling     Assessment: Encounter Diagnoses  Name Primary?  . Acute bilateral low back pain with sciatica, sciatica laterality unspecified Yes  . Radicular pain of lower extremity   . Elevated lipase   . Nausea   .  Abdominal pain, unspecified abdominal location      Plan:  Labs today, go for back xray Reviewed 2017 PET scan, 2016 abdomen/pelvis CT reviewed visit notes and labs from 4 days ago.   Lipase elevated.   She didn't take the pain medication given Monday (Norco), due to prior problems with codeine.  I advised this is a different molecule, and she may tolerate this just fine, so use if in pain.    Restart Gabapentin 148m QHS and give it another trial F/u pending labs   DBriettawas seen today for other.  Diagnoses and all orders for this  visit:  Acute bilateral low back pain with sciatica, sciatica laterality unspecified -     TSH -     Lipase -     DG Lumbar Spine Complete; Future -     CBC  Radicular pain of lower extremity -     TSH -     Lipase -     DG Lumbar Spine Complete; Future -     CBC  Elevated lipase -     TSH -     Lipase -     DG Lumbar Spine Complete; Future -     CBC  Nausea -     TSH -     Lipase -     DG Lumbar Spine Complete; Future -     CBC  Abdominal pain, unspecified abdominal location -     TSH -     Lipase -     DG Lumbar Spine Complete; Future -     CBC

## 2018-06-09 LAB — LIPASE: LIPASE: 68 U/L (ref 14–72)

## 2018-06-09 LAB — CBC
Hematocrit: 39.5 % (ref 34.0–46.6)
Hemoglobin: 13.6 g/dL (ref 11.1–15.9)
MCH: 30.6 pg (ref 26.6–33.0)
MCHC: 34.4 g/dL (ref 31.5–35.7)
MCV: 89 fL (ref 79–97)
PLATELETS: 299 10*3/uL (ref 150–450)
RBC: 4.44 x10E6/uL (ref 3.77–5.28)
RDW: 13.6 % (ref 12.3–15.4)
WBC: 3 10*3/uL — AB (ref 3.4–10.8)

## 2018-06-09 LAB — TSH: TSH: 2.52 u[IU]/mL (ref 0.450–4.500)

## 2018-07-10 ENCOUNTER — Ambulatory Visit: Payer: 59 | Admitting: Medical

## 2018-07-10 ENCOUNTER — Other Ambulatory Visit: Payer: Self-pay | Admitting: Medical

## 2018-07-10 VITALS — BP 140/90 | HR 100 | Temp 98.1°F | Resp 16 | Ht 64.0 in | Wt 174.2 lb

## 2018-07-10 DIAGNOSIS — J069 Acute upper respiratory infection, unspecified: Secondary | ICD-10-CM

## 2018-07-10 DIAGNOSIS — M659 Synovitis and tenosynovitis, unspecified: Secondary | ICD-10-CM | POA: Diagnosis not present

## 2018-07-10 DIAGNOSIS — E118 Type 2 diabetes mellitus with unspecified complications: Secondary | ICD-10-CM
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
## 2018-07-10 NOTE — Patient Instructions (Addendum)
Tenosynovitis   Recommendations:  Begin Thumb Spica splint for 1-2 weeks  Consider using arm sling 1-2 hours at a time for the next week  Use OTC Aleve 1-2 tablets twice daily for up to a week  Use ice water pack 20 minutes twice daily with elevation

## 2018-07-10 NOTE — Telephone Encounter (Signed)
Is this ok to refill?  

## 2018-08-19 ENCOUNTER — Other Ambulatory Visit: Payer: Self-pay | Admitting: Medical

## 2018-08-31 ENCOUNTER — Ambulatory Visit: Payer: 59 | Admitting: Medical

## 2018-08-31 VITALS — BP 128/74 | HR 90 | Temp 98.1°F | Resp 16 | Ht 64.0 in | Wt 169.0 lb

## 2018-08-31 DIAGNOSIS — D869 Sarcoidosis, unspecified: Secondary | ICD-10-CM

## 2018-08-31 DIAGNOSIS — R202 Paresthesia of skin: Secondary | ICD-10-CM

## 2018-08-31 DIAGNOSIS — G5602 Carpal tunnel syndrome, left upper limb: Secondary | ICD-10-CM

## 2018-08-31 DIAGNOSIS — E118 Type 2 diabetes mellitus with unspecified complications: Secondary | ICD-10-CM | POA: Diagnosis not present

## 2018-08-31 DIAGNOSIS — M25541 Pain in joints of right hand: Secondary | ICD-10-CM | POA: Diagnosis not present

## 2018-08-31 DIAGNOSIS — Z2821 Immunization not carried out because of patient refusal: Secondary | ICD-10-CM

## 2018-08-31 DIAGNOSIS — G5600 Carpal tunnel syndrome, unspecified upper limb: Secondary | ICD-10-CM | POA: Insufficient documentation

## 2018-08-31 LAB — POCT GLYCOSYLATED HEMOGLOBIN (HGB A1C): HEMOGLOBIN A1C: 6.6 % — AB (ref 4.0–5.6)

## 2018-08-31 NOTE — Progress Notes (Signed)
Subjective: Chief Complaint  Patient presents with  . hand issue    left hand burns, tingles, numbs X for awhile.     Left hand burning , awakening her in pain, tinglings and gets numb.  This was intermittent, but now daily, worsening.  Has been using night time CTS splint for past 2 months.  No prior nerve studies on left arm.   No neck pain.   Symptoms only involve left hand.   Has aortitis pain in right hand.  No other aggravating or relieving factors.   She certainly is not here for diabetes follow-up I realize her last A1c was January 2019.  She is using Ozempic weekly and doing well on this, sugars been running 140 or less, still using Humalog mix but not as often since her numbers have been better controlled.  No foot concerns no blurred vision no polyuria polydipsia.  No other complaint.  Past Medical History:  Diagnosis Date  . 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 Allergy   . Biceps tendonitis 2/12   Dr. Gladstone Lighter  . Diabetes mellitus    type II  . History of blood transfusion 1988   hysterectomy  . History of mammogram 05/25/12   stable, no suspicious finding, extremely dense breast tissue  . Hyperlipidemia   . Hypertension   . Neutropenia, unspecified ([place]) 02/2013   slight, likely ethnicity related  . Pneumococcal vaccine refused 03/2013  . Refused influenza vaccine 03/2013  . S/P hysterectomy   . Sarcoidosis   . Shortness of breath dyspnea    with exertion   . Supraspinatus tendonitis 2/12   Dr. Gladstone Lighter  . Wears glasses    Current Outpatient Medications on File Prior to Visit  Medication Sig Dispense Refill  . amLODipine (NORVASC) 10 MG tablet TAKE 1 TABLET BY MOUTH EVERY DAY 90 tablet 0  . azaTHIOprine (IMURAN) 50 MG tablet Take 50 mg by mouth daily.    Marland Kitchen BAYER CONTOUR TEST test strip TEST BLOOD SUGAR TWICE DAILY 100 each 0  . blood glucose meter kit and supplies KIT Dispense based on  patient and insurance preference. Use up to four times daily as directed. (FOR ICD-9 250.00, 250.01). 1 each 0  . glucose blood (ONE TOUCH ULTRA TEST) test strip Use as instructed 100 each 12  . HUMALOG MIX 75/25 KWIKPEN (75-25) 100 UNIT/ML Kwikpen ADMINISTER 50 UNITS UNDER THE SKIN THREE TIMES DAILY 15 mL 3  . ibuprofen (ADVIL,MOTRIN) 800 MG tablet Take 1 tablet (800 mg total) 3 (three) times daily by mouth. 21 tablet 0  . Insulin Pen Needle (BD PEN NEEDLE NANO U/F) 32G X 4 MM MISC INJECT THREE TIMES DAILY AS DIRECTED 100 each 11  . Lancets 30G MISC Test blood sugars twice daily 100 each 2  . lisinopril-hydrochlorothiazide (PRINZIDE,ZESTORETIC) 20-25 MG tablet Take 1 tablet by mouth daily. 90 tablet 3  . metFORMIN (GLUCOPHAGE-XR) 500 MG 24 hr tablet Take 1 tablet (500 mg total) by mouth daily with breakfast. 2 tablets daily 180 tablet 0  . pravastatin (PRAVACHOL) 40 MG tablet TAKE 1 TABLET(40 MG) BY MOUTH DAILY 90 tablet 3  . Semaglutide (OZEMPIC) 1 MG/DOSE SOPN Inject 1 mg into the skin once a week. 1.5 mL 11  . cholecalciferol (VITAMIN D) 1000 units tablet Take 1 tablet (1,000 Units total) by mouth daily. (Patient not taking: Reported on 06/04/2018) 90 tablet 3  . nystatin cream (MYCOSTATIN)  Apply 1 application topically 2 (two) times daily. (Patient not taking: Reported on 07/10/2018) 30 g 2   No current facility-administered medications on file prior to visit.    Past Surgical History:  Procedure Laterality Date  . ABDOMINAL HYSTERECTOMY  1990   and BSO; endometriosis  . ANTERIOR CERVICAL DECOMP/DISCECTOMY FUSION N/A 06/20/2013   Procedure: ANTERIOR CERVICAL DECOMPRESSION/DISCECTOMY FUSION 1 LEVEL;  Surgeon: Ophelia Charter, MD;  Location: [place] [facility];  Service: Neurosurgery;  Laterality: N/A;  C45 anterior cervical decompression with fusion interbody prothesis plating and bonegraft  . BREAST REDUCTION SURGERY  1994  . CARPAL TUNNEL RELEASE Right 06/20/2013   Procedure: CARPAL TUNNEL  RELEASE;  Surgeon: Ophelia Charter, MD;  Location: [facility];  Service: Neurosurgery;  Laterality: Right;  RIGHT carpal tunnel release  . COLONOSCOPY  03/06/12   sigmoid polyp; repeat 10 years; Dr. Tora Duck  . LUNG SURGERY  1987   endometriosis in lungs  . SUPRACLAVICAL NODE BIOPSY Left 01/14/2016   Procedure: LEFT SUPRACLAVICAL NODE BIOPSY;  Surgeon: Melrose Nakayama, MD;  Location: [facility];  Service: Thoracic;  Laterality: Left;    ROS as in subjective    Objective: BP 128/74   Pulse 90   Temp 98.1 F (36.7 C) (Oral)   Resp 16   Ht _0  (1.626 m)   Wt 169 lb (76.7 kg)   LMP 12/27/1987   SpO2 98%   BMI 29.01 kg/m   General: Well-developed well-nourished no acute distress Positive Tinel and Phalen's on the left, nontender no swelling of the hand or wrist, no obvious joint swelling or joint deformity either hand, finger and wrist and arm range of motion normal bilaterally Otherwise arms neurovascularly intact      Assessment: Encounter Diagnoses  Name Primary?  . Paresthesias in left hand Yes  . Carpal tunnel syndrome on left   . Arthralgia of hand, right   . Diabetes mellitus with complication ([place])   . Sarcoidosis   . Influenza vaccination declined      Plan: Paresthesias and likely carpal tunnel syndrome left hand- she has failed conservative therapy with wrist splint and anti-inflammatory.  Cannot rule out sarcoid involvement of the more than likely carpal tunnel syndrome.  Referral to orthopedist.  Arthralgia of hand-no obvious deformity, continue regular exercise, Tylenol as needed  Diabetes-hemoglobin A1c today 6.6%.  She states to be doing well on Ozempic weekly, sometimes using Humalog if sugars are running higher  Sarcoidosis-follow-up with specialist as planned, sees them regularly  She declines flu shot today  Bethany Webb was seen today for hand issue.  Diagnoses and all orders for this visit:  Paresthesias in left hand -     Ambulatory  referral to Orthopedic Surgery  Carpal tunnel syndrome on left -     Ambulatory referral to Orthopedic Surgery  Arthralgia of hand, right -     Ambulatory referral to Orthopedic Surgery  Diabetes mellitus with complication ([place]) -     Cancel: HgB A1c -     HgB A1c  Sarcoidosis  Influenza vaccination declined

## 2018-09-04 ENCOUNTER — Ambulatory Visit: Payer: 59 | Admitting: Medical

## 2018-09-04 ENCOUNTER — Encounter (INDEPENDENT_AMBULATORY_CARE_PROVIDER_SITE_OTHER): Payer: Self-pay | Admitting: Orthopaedic Surgery

## 2018-09-04 ENCOUNTER — Ambulatory Visit (INDEPENDENT_AMBULATORY_CARE_PROVIDER_SITE_OTHER): Payer: 59 | Admitting: Orthopaedic Surgery

## 2018-09-04 VITALS — BP 126/80 | HR 99 | Temp 98.0°F | Resp 16 | Ht 64.0 in | Wt 168.0 lb

## 2018-09-04 DIAGNOSIS — G5602 Carpal tunnel syndrome, left upper limb: Secondary | ICD-10-CM | POA: Diagnosis not present

## 2018-09-04 DIAGNOSIS — H9203 Otalgia, bilateral: Secondary | ICD-10-CM | POA: Diagnosis not present

## 2018-09-04 DIAGNOSIS — H6123 Impacted cerumen, bilateral: Secondary | ICD-10-CM

## 2018-09-04 NOTE — Progress Notes (Signed)
Subjective: Chief Complaint  Patient presents with  . ear pain    ear itchy    Here for ear c/o.   Feels itchy and some pressure in each ear.   Tried OTC ear wax drops with no improvement.  No fever.  No sore throat.  Has some cough, some sniffles she attributes to allergies, but not bad.  Not congested.  No chest tightness.  No other aggravating or relieving factors. No other complaint.   Objective: BP 126/80   Pulse 99   Temp 98 F (36.7 C) (Oral)   Resp 16   Ht 5\' 4"  (1.626 m)   Wt 168 lb (76.2 kg)   LMP 12/27/1987   SpO2 98%   BMI 28.84 kg/m   Gen: wd, wn, nad HENT: impacted cerumen bilat, but no other ear abnormality, HENT otherwise unremarkable Oral: somewhat dry mucous membranes, no other lesions, no pharyngeal erythema    Assessment Encounter Diagnoses  Name Primary?  . Ear discomfort, bilateral Yes  . Impacted cerumen of both ears      Plan: Discussed findings.  Discussed risk/benefits of procedure and patient agrees to procedure. Successfully used warm water lavage to remove impacted cerumen from bilat ear canal. Patient tolerated procedure well. Advised they avoid using any cotton swabs or other devices to clean the ear canals.  Use basic hygiene as discussed.  Follow up prn.   Bethany Webb was seen today for ear pain.  Diagnoses and all orders for this visit:  Ear discomfort, bilateral  Impacted cerumen of both ears

## 2018-09-04 NOTE — Progress Notes (Signed)
Office Visit Note   Patient: Bethany Webb           Date of Birth: [birth year]           MRN: [id number] Visit Date: 09/04/2018              Requested by: Carlena Hurl, PA-C [address] [place], [place] [address] PCP: Carlena Hurl, PA-C   Assessment & Plan: Visit Diagnoses:  1. Left carpal tunnel syndrome     Plan: Impression is left carpal tunnel syndrome.  We will obtain nerve conduction studies to fully evaluate this.  Follow-up afterwards.  In the meantime recommend carpal tunnel braces around-the-clock.  Follow-Up Instructions: Return if symptoms worsen or fail to improve.   Orders:  No orders of the defined types were placed in this encounter.  No orders of the defined types were placed in this encounter.     Procedures: No procedures performed   Clinical Data: No additional findings.   Subjective: Chief Complaint  Patient presents with  . Left Hand - Pain    Bethany Webb is a 57 year old female comes in with left hand pain and numbness for about 3 weeks.  She is status post right carpal tunnel release many years ago by Dr. Earle Gell of neurosurgery.  She states that the pain is constant and she wears a wrist brace at night but this does not give her daytime relief.   Review of Systems  Constitutional: Negative.   HENT: Negative.   Eyes: Negative.   Respiratory: Negative.   Cardiovascular: Negative.   Endocrine: Negative.   Musculoskeletal: Negative.   Neurological: Negative.   Hematological: Negative.   Psychiatric/Behavioral: Negative.   All other systems reviewed and are negative.    Objective: Vital Signs: LMP 12/27/1987   Physical Exam  Constitutional: She is oriented to person, place, and time. She appears well-developed and well-nourished.  HENT:  Head: Normocephalic and atraumatic.  Eyes: EOM are normal.  Neck: Neck supple.  Pulmonary/Chest: Effort normal.  Abdominal: Soft.  Neurological: She is alert and oriented to  person, place, and time.  Skin: Skin is warm. Capillary refill takes less than 2 seconds.  Psychiatric: She has a normal mood and affect. Her behavior is normal. Judgment and thought content normal.  Nursing note and vitals reviewed.   Ortho Exam Left hand exam shows negative carpal tunnel compressive signs although patient states that she cannot feel me during the provocative testing.  She has no focal motor deficits.  No muscle atrophy. Specialty Comments:  No specialty comments available.  Imaging: No results found.   PMFS History: Patient Active Problem List   Diagnosis Date Noted  . Paresthesias in left hand 08/31/2018  . Carpal tunnel syndrome on left 08/31/2018  . Arthralgia of hand, right 08/31/2018  . Abdominal pain 06/08/2018  . Elevated lipase 06/08/2018  . Radicular pain of lower extremity 06/08/2018  . Acute bilateral low back pain with sciatica 06/08/2018  . Paresthesia 04/30/2018  . Weight gain 01/09/2018  . High risk medication use 01/09/2018  . Need for influenza vaccination 09/21/2017  . Vaccine counseling 09/21/2017  . Nausea 09/15/2016  . Abdominal pain, epigastric 09/15/2016  . RUQ pain 09/15/2016  . 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 Sarcoidosis 01/21/2016  . 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 Abnormal liver function 01/21/2016  . Elevated liver enzymes 01/05/2016  . Abnormal chest CT 01/05/2016  . Lymphadenopathy, mediastinal 01/05/2016  .  Pleural plaque 01/05/2016  . Pulmonary nodule 01/05/2016  . Anorexia 01/05/2016  . Night sweats 01/05/2016  . Allergic conjunctivitis 10/18/2011  . Allergic rhinitis 10/18/2011  . Diabetes mellitus with complication ([place]) 54/65/0354  . Hyperlipidemia associated with type 2 diabetes mellitus ([place]) 10/06/2011  . Essential hypertension, benign 10/06/2011   Past Medical History:  Diagnosis Date  . Abnormal MRI, cervical spine    mild degenerative  spondylosis w/ multi level disc and facet disease, bulging discs  . Allergy   . Biceps tendonitis 2/12   Dr. Gladstone Lighter  . Diabetes mellitus    type II  . History of blood transfusion 1988   hysterectomy  . History of mammogram 05/25/12   stable, no suspicious finding, extremely dense breast tissue  . Hyperlipidemia   . Hypertension   . Neutropenia, unspecified ([place]) 02/2013   slight, likely ethnicity related  . Pneumococcal vaccine refused 03/2013  . Refused influenza vaccine 03/2013  . S/P hysterectomy   . Sarcoidosis   . Shortness of breath dyspnea    with exertion   . Supraspinatus tendonitis 2/12   Dr. Gladstone Lighter  . Wears glasses     Family History  Problem Relation Age of Onset  . Heart disease Father 80       died of MI, CABG   . Multiple sclerosis Sister   . Asthma Brother   . Cancer Paternal Grandmother        breast  . Diabetes Other        maternal and paternal grandmother  . Cancer Maternal Uncle        hematologic  . Hypertension Maternal Grandmother   . Cancer Maternal Grandfather        prostate  . Hypertension Maternal Grandfather   . Asthma Brother   . Eczema Sister   . Allergies Sister   . Healthy Daughter     Past Surgical History:  Procedure Laterality Date  . ABDOMINAL HYSTERECTOMY  1990   and BSO; endometriosis  . ANTERIOR CERVICAL DECOMP/DISCECTOMY FUSION N/A 06/20/2013   Procedure: ANTERIOR CERVICAL DECOMPRESSION/DISCECTOMY FUSION 1 LEVEL;  Surgeon: Ophelia Charter, MD;  Location: [facility];  Service: Neurosurgery;  Laterality: N/A;  C45 anterior cervical decompression with fusion interbody prothesis plating and bonegraft  . BREAST REDUCTION SURGERY  1994  . CARPAL TUNNEL RELEASE Right 06/20/2013   Procedure: CARPAL TUNNEL RELEASE;  Surgeon: Ophelia Charter, MD;  Location: [facility];  Service: Neurosurgery;  Laterality: Right;  RIGHT carpal tunnel release  . COLONOSCOPY  03/06/12   sigmoid polyp; repeat 10 years; Dr. Tora Duck  . LUNG  SURGERY  1987   endometriosis in lungs  . SUPRACLAVICAL NODE BIOPSY Left 01/14/2016   Procedure: LEFT SUPRACLAVICAL NODE BIOPSY;  Surgeon: Melrose Nakayama, MD;  Location: [facility];  Service: Thoracic;  Laterality: Left;   Social History   Occupational History  . Occupation: Midwife  Tobacco Use  . Smoking status: Never Smoker  . Smokeless tobacco: Never Used  Substance and Sexual Activity  . Alcohol use: Yes    Alcohol/week: 0.0 standard drinks    Comment: rare wine, maybe 2-3 times years  . Drug use: No  . Sexual activity: Never

## 2018-09-04 NOTE — Addendum Note (Signed)
Addended by: Minda Ditto, Geoffery Spruce on: 09/04/2018 04:46 PM   Modules accepted: Orders

## 2018-09-10 DIAGNOSIS — D869 Sarcoidosis, unspecified: Secondary | ICD-10-CM | POA: Diagnosis not present

## 2018-09-17 ENCOUNTER — Telehealth (HOSPITAL_COMMUNITY): Payer: Self-pay | Admitting: *Deleted

## 2018-09-17 NOTE — Telephone Encounter (Signed)
Received referral for pt for pulmonary rehab.  Pt seen in folllow up Dr. Theotis Barrio at [facility] for ILD.  Clinical review of pt follow up appt on 9/16.  Pulmonary office note.   Pt appropriate for scheduling for Pulmonary rehab.  Will forward to support staff for scheduling and verification of insurance eligibility/benefits with pt  consent. Cherre Huger, BSN Cardiac and Training and development officer    ;;

## 2018-09-21 ENCOUNTER — Telehealth (HOSPITAL_COMMUNITY): Payer: Self-pay

## 2018-09-21 NOTE — Telephone Encounter (Signed)
Patients insurance is active and benefits verified through [facility] - No co-pay, no deductible, out of pocket amount of $5,000/$5,000 has been met, no co-insurance, and no pre-authorization is required. Spoke with Charlynn Grimes from [facility] -Reference [phone]

## 2018-09-25 ENCOUNTER — Telehealth (HOSPITAL_COMMUNITY): Payer: Self-pay

## 2018-09-25 NOTE — Telephone Encounter (Signed)
Attempted to call patient in regards to Pulmonary Rehab - LM on VM °

## 2018-09-27 ENCOUNTER — Encounter (HOSPITAL_COMMUNITY): Payer: Self-pay

## 2018-09-27 ENCOUNTER — Observation Stay (HOSPITAL_COMMUNITY)
Admission: EM | Admit: 2018-09-27 | Discharge: 2018-09-29 | Disposition: A | Discharge status: Home / Self Care | Payer: 59 | Attending: Internal Medicine | Admitting: Internal Medicine

## 2018-09-27 ENCOUNTER — Emergency Department (HOSPITAL_COMMUNITY): Payer: 59

## 2018-09-27 ENCOUNTER — Ambulatory Visit (INDEPENDENT_AMBULATORY_CARE_PROVIDER_SITE_OTHER): Payer: 59 | Admitting: Medical

## 2018-09-27 ENCOUNTER — Other Ambulatory Visit: Payer: Self-pay

## 2018-09-27 VITALS — BP 180/110 | HR 178 | Temp 98.6°F

## 2018-09-27 DIAGNOSIS — R102 Pelvic and perineal pain: Secondary | ICD-10-CM | POA: Diagnosis not present

## 2018-09-27 DIAGNOSIS — E785 Hyperlipidemia, unspecified: Secondary | ICD-10-CM

## 2018-09-27 DIAGNOSIS — I10 Essential (primary) hypertension: Secondary | ICD-10-CM

## 2018-09-27 DIAGNOSIS — Z8042 Family history of malignant neoplasm of prostate: Secondary | ICD-10-CM | POA: Insufficient documentation

## 2018-09-27 DIAGNOSIS — Z825 Family history of asthma and other chronic lower respiratory diseases: Secondary | ICD-10-CM | POA: Insufficient documentation

## 2018-09-27 DIAGNOSIS — R000 Tachycardia, unspecified: Secondary | ICD-10-CM

## 2018-09-27 DIAGNOSIS — Z91013 Allergy to seafood: Secondary | ICD-10-CM | POA: Diagnosis not present

## 2018-09-27 DIAGNOSIS — D862 Sarcoidosis of lung with sarcoidosis of lymph nodes: Secondary | ICD-10-CM

## 2018-09-27 DIAGNOSIS — Z90710 Acquired absence of both cervix and uterus: Secondary | ICD-10-CM | POA: Diagnosis not present

## 2018-09-27 DIAGNOSIS — E118 Type 2 diabetes mellitus with unspecified complications: Secondary | ICD-10-CM

## 2018-09-27 DIAGNOSIS — D860 Sarcoidosis of lung: Secondary | ICD-10-CM | POA: Diagnosis present

## 2018-09-27 DIAGNOSIS — Z86010 Personal history of colonic polyps: Secondary | ICD-10-CM | POA: Diagnosis not present

## 2018-09-27 DIAGNOSIS — Z8249 Family history of ischemic heart disease and other diseases of the circulatory system: Secondary | ICD-10-CM | POA: Diagnosis not present

## 2018-09-27 DIAGNOSIS — R079 Chest pain, unspecified: Secondary | ICD-10-CM | POA: Diagnosis not present

## 2018-09-27 DIAGNOSIS — Z803 Family history of malignant neoplasm of breast: Secondary | ICD-10-CM | POA: Diagnosis not present

## 2018-09-27 DIAGNOSIS — Z794 Long term (current) use of insulin: Secondary | ICD-10-CM | POA: Insufficient documentation

## 2018-09-27 DIAGNOSIS — R55 Syncope and collapse: Principal | ICD-10-CM

## 2018-09-27 DIAGNOSIS — G5602 Carpal tunnel syndrome, left upper limb: Secondary | ICD-10-CM | POA: Insufficient documentation

## 2018-09-27 DIAGNOSIS — E876 Hypokalemia: Secondary | ICD-10-CM

## 2018-09-27 DIAGNOSIS — Z885 Allergy status to narcotic agent status: Secondary | ICD-10-CM

## 2018-09-27 DIAGNOSIS — R0602 Shortness of breath: Secondary | ICD-10-CM | POA: Diagnosis not present

## 2018-09-27 DIAGNOSIS — E119 Type 2 diabetes mellitus without complications: Secondary | ICD-10-CM | POA: Diagnosis not present

## 2018-09-27 DIAGNOSIS — Z808 Family history of malignant neoplasm of other organs or systems: Secondary | ICD-10-CM | POA: Insufficient documentation

## 2018-09-27 DIAGNOSIS — Z981 Arthrodesis status: Secondary | ICD-10-CM | POA: Diagnosis not present

## 2018-09-27 DIAGNOSIS — Z888 Allergy status to other drugs, medicaments and biological substances status: Secondary | ICD-10-CM | POA: Diagnosis not present

## 2018-09-27 DIAGNOSIS — I152 Hypertension secondary to endocrine disorders: Secondary | ICD-10-CM | POA: Diagnosis present

## 2018-09-27 DIAGNOSIS — E1169 Type 2 diabetes mellitus with other specified complication: Secondary | ICD-10-CM | POA: Diagnosis present

## 2018-09-27 DIAGNOSIS — Z820 Family history of epilepsy and other diseases of the nervous system: Secondary | ICD-10-CM | POA: Diagnosis not present

## 2018-09-27 DIAGNOSIS — Z79899 Other long term (current) drug therapy: Secondary | ICD-10-CM

## 2018-09-27 DIAGNOSIS — D869 Sarcoidosis, unspecified: Secondary | ICD-10-CM

## 2018-09-27 DIAGNOSIS — E1159 Type 2 diabetes mellitus with other circulatory complications: Secondary | ICD-10-CM | POA: Diagnosis not present

## 2018-09-27 DIAGNOSIS — Z833 Family history of diabetes mellitus: Secondary | ICD-10-CM

## 2018-09-27 DIAGNOSIS — R109 Unspecified abdominal pain: Secondary | ICD-10-CM | POA: Diagnosis not present

## 2018-09-27 DIAGNOSIS — R112 Nausea with vomiting, unspecified: Secondary | ICD-10-CM | POA: Diagnosis not present

## 2018-09-27 DIAGNOSIS — R52 Pain, unspecified: Secondary | ICD-10-CM | POA: Diagnosis not present

## 2018-09-27 HISTORY — DX: Other seasonal allergic rhinitis: J30.2

## 2018-09-27 HISTORY — DX: Type 2 diabetes mellitus without complications: E11.9

## 2018-09-27 LAB — BASIC METABOLIC PANEL
Anion gap: 17 — ABNORMAL HIGH (ref 5–15)
BUN: 14 mg/dL (ref 6–20)
CALCIUM: 9.9 mg/dL (ref 8.9–10.3)
CO2: 22 mmol/L (ref 22–32)
Chloride: 100 mmol/L (ref 98–111)
Creatinine, Ser: 0.85 mg/dL (ref 0.44–1.00)
GFR calc Af Amer: >60 mL/min (ref >60)
GLUCOSE: 103 mg/dL — AB (ref 70–99)
Potassium: 3 mmol/L — ABNORMAL LOW (ref 3.5–5.1)
Sodium: 139 mmol/L (ref 135–145)

## 2018-09-27 LAB — CBC WITH DIFFERENTIAL/PLATELET
Abs Immature Granulocytes: 0 10*3/uL (ref 0.0–0.1)
BASOS ABS: 0 10*3/uL (ref 0.0–0.1)
BASOS PCT: 1 %
Eosinophils Absolute: 0.1 10*3/uL (ref 0.0–0.7)
Eosinophils Relative: 1 %
HCT: 36.9 % (ref 36.0–46.0)
HEMOGLOBIN: 12.3 g/dL (ref 12.0–15.0)
Immature Granulocytes: 1 %
Lymphocytes Relative: 30 %
Lymphs Abs: 1.1 10*3/uL (ref 0.7–4.0)
MCH: 29.7 pg (ref 26.0–34.0)
MCHC: 33.3 g/dL (ref 30.0–36.0)
MCV: 89.1 fL (ref 78.0–100.0)
MONO ABS: 0.5 10*3/uL (ref 0.1–1.0)
MONOS PCT: 13 %
Neutro Abs: 2.1 10*3/uL (ref 1.7–7.7)
Neutrophils Relative %: 54 %
Platelets: 342 10*3/uL (ref 150–400)
RBC: 4.14 MIL/uL (ref 3.87–5.11)
RDW: 13.2 % (ref 11.5–15.5)
WBC: 3.8 10*3/uL — ABNORMAL LOW (ref 4.0–10.5)

## 2018-09-27 LAB — I-STAT TROPONIN, ED: TROPONIN I, POC: 0 ng/mL (ref 0.00–0.08)

## 2018-09-27 LAB — MAGNESIUM: MAGNESIUM: 1.7 mg/dL (ref 1.7–2.4)

## 2018-09-27 MED ORDER — ENOXAPARIN SODIUM 40 MG/0.4ML ~~LOC~~ SOLN
40.0000 mg | SUBCUTANEOUS | Status: DC
  Administered 2018-09-27 – 2018-09-28 (×2): 40 mg via SUBCUTANEOUS
  Filled 2018-09-27 (×2): qty 0.4

## 2018-09-27 MED ORDER — ACETAMINOPHEN 325 MG PO TABS
650.0000 mg | ORAL_TABLET | Freq: Four times a day (QID) | ORAL | Status: DC | PRN
  Filled 2018-09-27: qty 2

## 2018-09-27 MED ORDER — ONDANSETRON HCL 4 MG/2ML IJ SOLN
4.0000 mg | Freq: Four times a day (QID) | INTRAMUSCULAR | Status: DC | PRN
  Administered 2018-09-28 (×2): 4 mg via INTRAVENOUS
  Filled 2018-09-27 (×2): qty 2

## 2018-09-27 MED ORDER — ACETAMINOPHEN 650 MG RE SUPP: 650.0000 mg | Freq: Four times a day (QID) | RECTAL | Status: DC | PRN

## 2018-09-27 MED ORDER — IOPAMIDOL (ISOVUE-370) INJECTION 76%
INTRAVENOUS | Status: AC
  Filled 2018-09-27: qty 100

## 2018-09-27 MED ORDER — SODIUM CHLORIDE 0.9% FLUSH
3.0000 mL | Freq: Two times a day (BID) | INTRAVENOUS | Status: DC
  Administered 2018-09-27 – 2018-09-28 (×3): 3 mL via INTRAVENOUS

## 2018-09-27 MED ORDER — POTASSIUM CHLORIDE CRYS ER 20 MEQ PO TBCR
40.0000 meq | EXTENDED_RELEASE_TABLET | Freq: Once | ORAL | Status: AC
  Administered 2018-09-27: 40 meq via ORAL
  Filled 2018-09-27: qty 2

## 2018-09-27 MED ORDER — INFLUENZA VAC SPLIT QUAD 0.5 ML IM SUSY
0.5000 mL | PREFILLED_SYRINGE | INTRAMUSCULAR | Status: DC
  Filled 2018-09-27: qty 0.5

## 2018-09-27 MED ORDER — ONDANSETRON HCL 4 MG PO TABS
4.0000 mg | ORAL_TABLET | Freq: Four times a day (QID) | ORAL | Status: DC | PRN
  Administered 2018-09-28 – 2018-09-29 (×2): 4 mg via ORAL
  Filled 2018-09-27 (×2): qty 1

## 2018-09-27 MED ORDER — IOPAMIDOL (ISOVUE-370) INJECTION 76%
100.0000 mL | Freq: Once | INTRAVENOUS | Status: AC | PRN
  Administered 2018-09-27: 100 mL via INTRAVENOUS

## 2018-09-27 NOTE — H&P (Signed)
History and Physical    Bethany Webb ZTI:[id number] DOB: 10/23/1961 DOA: 09/27/2018  PCP: Carlena Hurl, PA-C  Patient coming from: PCP office  I have personally briefly reviewed patient's old medical records in [place]  Chief Complaint: Near syncope  HPI: Bethany Webb is a 57 y.o. female with medical history significant of hypertension, hyperlipidemia, diabetes, sarcoidosis who presented from her PCP office after a near syncopal episode.  Patient states that she was going to her PCP for evaluation of pain and cramping in both of her arms and legs for 1 week.  In the waiting room, she became lightheaded and felt as sure if she was going to pass out.  She denies any loss of consciousness.  She was noted to have tachycardia with reported pulse of 182 blood pressure of 170/111 at the PCP office.  She had associated nausea with vomiting.  She was treated with IV Zofran and 500 mL IV fluid bolus by EMS and brought to the emergency department.  She had associated palpitations and abdominal discomfort during this episode.  She denies any seizure-like activity, focal weakness, chest pain, dyspnea, bowel/bladder dysfunction.  She reports poor appetite for the last few days.  ED Course:  In the ED, initial BP was 127/71, pulse 108, respiratory rate 18, temp 98.60F, SPO2 100% RA.  Lab work was notable for K 3.0.  I-STAT troponin 0.0.  CTA chest/abd/pelvis was obtained which was negative for aortic aneurysm or dissection.  Review of Systems: As per HPI otherwise 10 point review of systems negative.    Past Medical History:  Diagnosis Date  . 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 Biceps tendonitis 2/12   Dr. Gladstone Lighter  . History of blood transfusion 1988   "related to hysterectomy" (09/27/2018)  . History of mammogram 05/25/12   stable, no suspicious finding, extremely dense breast tissue  . Hyperlipidemia     . Hypertension   . Neutropenia, unspecified ([place]) 02/2013   slight, likely ethnicity related  . Sarcoidosis    "lung, liver, eyes, skin" (09/27/2018)  . Seasonal allergies   . Shortness of breath dyspnea    with exertion   . Supraspinatus tendonitis 2/12   Dr. Gladstone Lighter  . Type II diabetes mellitus ([place])   . Wears glasses     Past Surgical History:  Procedure Laterality Date  . ABDOMINAL HYSTERECTOMY  1990   and BSO; endometriosis  . ANTERIOR CERVICAL DECOMP/DISCECTOMY FUSION N/A 06/20/2013   Procedure: ANTERIOR CERVICAL DECOMPRESSION/DISCECTOMY FUSION 1 LEVEL;  Surgeon: Ophelia Charter, MD;  Location: [facility];  Service: Neurosurgery;  Laterality: N/A;  C45 anterior cervical decompression with fusion interbody prothesis plating and bonegraft  . BACK SURGERY    . CARPAL TUNNEL RELEASE Right 06/20/2013   Procedure: CARPAL TUNNEL RELEASE;  Surgeon: Ophelia Charter, MD;  Location: [facility];  Service: Neurosurgery;  Laterality: Right;  RIGHT carpal tunnel release  . COLONOSCOPY  03/06/12   sigmoid polyp; repeat 10 years; Dr. Tora Duck  . LUNG SURGERY  1987   endometriosis in lungs  . REDUCTION MAMMAPLASTY  1994  . SUPRACLAVICAL NODE BIOPSY Left 01/14/2016   Procedure: LEFT SUPRACLAVICAL NODE BIOPSY;  Surgeon: Melrose Nakayama, MD;  Location: [facility];  Service: Thoracic;  Laterality: Left;  . TONSILLECTOMY       reports that she has never smoked. She has never used smokeless tobacco.  She reports that she drinks alcohol. She reports that she does not use drugs.  Allergies  Allergen Reactions  . Fish-Derived Products Anaphylaxis, Itching and Swelling    Throat swells and lips itch  . Shellfish-Derived Products Anaphylaxis, Itching and Swelling    Throat swells and lips itch  . Codeine Nausea Only  . Gabapentin Other (See Comments)    Per patient: "Unusual feeling"  . Metformin And Related Diarrhea and Nausea Only    Family History  Problem Relation Age of Onset  .  Heart disease Father 45       died of MI, CABG   . Multiple sclerosis Sister   . Asthma Brother   . Cancer Paternal Grandmother        breast  . Diabetes Other        maternal and paternal grandmother  . Cancer Maternal Uncle        hematologic  . Hypertension Maternal Grandmother   . Cancer Maternal Grandfather        prostate  . Hypertension Maternal Grandfather   . Asthma Brother   . Eczema Sister   . Allergies Sister   . Healthy Daughter      Prior to Admission medications   Medication Sig Start Date End Date Taking? Authorizing Provider  amLODipine (NORVASC) 10 MG tablet TAKE 1 TABLET BY MOUTH EVERY DAY Patient taking differently: Take 10 mg by mouth daily.  08/20/18  Yes Tysinger, Camelia Eng, PA-C  azaTHIOprine (IMURAN) 50 MG tablet Take 50 mg by mouth daily.   Yes [provider]  HUMALOG MIX 75/25 KWIKPEN (75-25) 100 UNIT/ML Kwikpen ADMINISTER 50 UNITS UNDER THE SKIN THREE TIMES DAILY Patient taking differently: Inject 10-12 Units into the skin See admin instructions. Inject 10-12 units into the skin 1-2 times a day before meals if BGL is 150-200 or greater 07/10/18  Yes Tysinger, Camelia Eng, PA-C  lisinopril-hydrochlorothiazide (PRINZIDE,ZESTORETIC) 20-25 MG tablet Take 1 tablet by mouth daily. 01/26/18  Yes Tysinger, Camelia Eng, PA-C  metFORMIN (GLUCOPHAGE-XR) 500 MG 24 hr tablet Take 1 tablet (500 mg total) by mouth daily with breakfast. 2 tablets daily Patient taking differently: Take 500 mg by mouth 2 (two) times daily.  04/30/18  Yes Tysinger, Camelia Eng, PA-C  NON FORMULARY CBD oil:   Yes [provider]  PEPPERMINT OIL PO Take by mouth.   Yes [provider]  pravastatin (PRAVACHOL) 40 MG tablet TAKE 1 TABLET(40 MG) BY MOUTH DAILY 01/26/18  Yes Tysinger, Camelia Eng, PA-C  BAYER CONTOUR TEST test strip TEST BLOOD SUGAR TWICE DAILY 10/30/17   Tysinger, Camelia Eng, PA-C  blood glucose meter kit and supplies KIT Dispense based on patient and insurance preference. Use up  to four times daily as directed. (FOR ICD-9 250.00, 250.01). 03/08/16   Tysinger, Camelia Eng, PA-C  cholecalciferol (VITAMIN D) 1000 units tablet Take 1 tablet (1,000 Units total) by mouth daily. Patient not taking: Reported on 09/27/2018 04/30/18   Tysinger, Camelia Eng, PA-C  glucose blood (ONE TOUCH ULTRA TEST) test strip Use as instructed 11/08/17   Tysinger, Camelia Eng, PA-C  ibuprofen (ADVIL,MOTRIN) 800 MG tablet Take 1 tablet (800 mg total) 3 (three) times daily by mouth. Patient not taking: Reported on 09/27/2018 10/31/17   Mackuen, Fredia Sorrow, MD  Insulin Pen Needle (BD PEN NEEDLE NANO U/F) 32G X 4 MM MISC INJECT THREE TIMES DAILY AS DIRECTED 01/26/18   Tysinger, Camelia Eng, PA-C  Lancets 30G MISC Test blood sugars twice daily 09/14/15  Tysinger, Camelia Eng, PA-C  nystatin cream (MYCOSTATIN) Apply 1 application topically 2 (two) times daily. Patient not taking: Reported on 09/27/2018 09/21/17   Tysinger, Camelia Eng, PA-C  Semaglutide (OZEMPIC) 1 MG/DOSE SOPN Inject 1 mg into the skin once a week. Patient taking differently: Inject 1 mg into the skin every Monday.  04/30/18   Carlena Hurl, PA-C    Physical Exam: Vitals:   09/27/18 1631 09/27/18 1900 09/27/18 2027 09/27/18 2200  BP: 127/71 (!) 150/78 (!) 134/105   Pulse: (!) 108 (!) 110 (!) 101   Resp: 18 (!) 9    Temp:      TempSrc:      SpO2: 100% 100% 99%   Weight:    74.8 kg  Height:    '5\' 4"'  (1.626 m)    Constitutional: NAD, calm, comfortable Vitals:   09/27/18 1631 09/27/18 1900 09/27/18 2027 09/27/18 2200  BP: 127/71 (!) 150/78 (!) 134/105   Pulse: (!) 108 (!) 110 (!) 101   Resp: 18 (!) 9    Temp:      TempSrc:      SpO2: 100% 100% 99%   Weight:    74.8 kg  Height:    '5\' 4"'  (1.626 m)   Eyes: PERRL, lids and conjunctivae normal ENMT: Mucous membranes are moist. Posterior pharynx clear of any exudate or lesions.Normal dentition.  Neck: normal, supple, no masses, no thyromegaly Respiratory: clear to auscultation bilaterally, no  wheezing, no crackles. Normal respiratory effort. No accessory muscle use.  Cardiovascular: Regular rate and rhythm, no murmurs / rubs / gallops. No extremity edema.  Abdomen: no tenderness, no masses palpated. No hepatosplenomegaly. Bowel sounds positive.  Musculoskeletal:  No joint deformity upper and lower extremities. Good ROM, no contractures. Normal muscle tone.  Skin: no rashes, lesions, ulcers. No induration Neurologic: CN 2-12 grossly intact. Sensation intact. Strength 5/5 in all 4.  Psychiatric: Normal judgment and insight. Alert and oriented x 3. Normal mood.    Labs on Admission: I have personally reviewed following labs and imaging studies  CBC: Recent Labs  Lab 09/27/18 1745  WBC 3.8*  NEUTROABS 2.1  HGB 12.3  HCT 36.9  MCV 89.1  PLT 222   Basic Metabolic Panel: Recent Labs  Lab 09/27/18 1745  NA 139  K 3.0*  CL 100  CO2 22  GLUCOSE 103*  BUN 14  CREATININE 0.85  CALCIUM 9.9  MG 1.7   GFR: Estimated Creatinine Clearance: 70.5 mL/min (by C-G formula based on SCr of 0.85 mg/dL). Liver Function Tests: No results for input(s): AST, ALT, ALKPHOS, BILITOT, PROT, ALBUMIN in the last 168 hours. No results for input(s): LIPASE, AMYLASE in the last 168 hours. No results for input(s): AMMONIA in the last 168 hours. Coagulation Profile: No results for input(s): INR, PROTIME in the last 168 hours. Cardiac Enzymes: No results for input(s): CKTOTAL, CKMB, CKMBINDEX, TROPONINI in the last 168 hours. BNP (last 3 results) No results for input(s): PROBNP in the last 8760 hours. HbA1C: No results for input(s): HGBA1C in the last 72 hours. CBG: No results for input(s): GLUCAP in the last 168 hours. Lipid Profile: No results for input(s): CHOL, HDL, LDLCALC, TRIG, CHOLHDL, LDLDIRECT in the last 72 hours. Thyroid Function Tests: No results for input(s): TSH, T4TOTAL, FREET4, T3FREE, THYROIDAB in the last 72 hours. Anemia Panel: No results for input(s): VITAMINB12,  FOLATE, FERRITIN, TIBC, IRON, RETICCTPCT in the last 72 hours. Urine analysis:    Component Value Date/Time   COLORURINE YELLOW  10/31/2017 [place] 10/31/2017 1641   LABSPEC 1.023 10/31/2017 1641   LABSPEC 1.030 07/21/2017 1131   PHURINE 6.0 10/31/2017 1641   GLUCOSEU NEGATIVE 10/31/2017 1641   GLUCOSEU 250 (A) 08/11/2016 1412   HGBUR NEGATIVE 10/31/2017 1641   BILIRUBINUR negative 06/04/2018 1141   BILIRUBINUR neg 01/23/2017 0833   KETONESUR negative 06/04/2018 1141   KETONESUR 5 (A) 10/31/2017 1641   PROTEINUR negative 06/04/2018 1141   PROTEINUR NEGATIVE 10/31/2017 1641   UROBILINOGEN negative 01/23/2017 0833   UROBILINOGEN 1.0 08/11/2016 1412   NITRITE Negative 06/04/2018 1141   NITRITE NEGATIVE 10/31/2017 1641   LEUKOCYTESUR Negative 06/04/2018 1141    Radiological Exams on Admission: Ct Angio Chest/abd/pel For Dissection W And/or W/wo  Result Date: 09/27/2018 CLINICAL DATA:  57 year old female with acute chest, abdominal and pelvic pain and near syncope. History of sarcoid. EXAM: CT ANGIOGRAPHY CHEST, ABDOMEN AND PELVIS TECHNIQUE: Multidetector CT imaging through the chest, abdomen and pelvis was performed using the standard protocol during bolus administration of intravenous contrast. Multiplanar reconstructed images and MIPs were obtained and reviewed to evaluate the vascular anatomy. CONTRAST:  166m ISOVUE-370 IOPAMIDOL (ISOVUE-370) INJECTION 76% COMPARISON:  08/24/2016 chest CT, 01/21/2016 PET CT and 12/17/2015 abdomen/pelvic CT. FINDINGS: CTA CHEST FINDINGS Cardiovascular: Heart size normal. There is no evidence of thoracic aortic aneurysm or dissection. No pericardial effusion. No large or central pulmonary emboli identified. Mediastinum/Nodes: No enlarged mediastinal, hilar, or axillary lymph nodes. Thyroid gland, trachea, and esophagus demonstrate no significant findings. Lungs/Pleura: No airspace disease, consolidation, suspicious nodule, mass, pleural  effusion or pneumothorax. Bilateral pulmonary nodules are noted, stable to decreased in size from 2017. Calcified pleural thickening on the LEFT is again noted. Musculoskeletal: No acute or suspicious bony abnormalities. Review of the MIP images confirms the above findings. CTA ABDOMEN AND PELVIS FINDINGS VASCULAR Aorta: Normal caliber aorta without aneurysm, dissection, vasculitis or significant stenosis. Celiac: Patent without evidence of aneurysm, dissection, vasculitis or significant stenosis. SMA: Patent without evidence of aneurysm, dissection, vasculitis or significant stenosis. Renals: Both renal arteries are patent without evidence of aneurysm, dissection, vasculitis, fibromuscular dysplasia or significant stenosis. IMA: Patent without evidence of aneurysm, dissection, vasculitis or significant stenosis. Inflow: Patent without evidence of aneurysm, dissection, vasculitis or significant stenosis. Veins: No obvious venous abnormality within the limitations of this arterial phase study. Review of the MIP images confirms the above findings. NON-VASCULAR Hepatobiliary: The liver and gallbladder are unremarkable. No biliary dilatation. Pancreas: Unremarkable Spleen: Unremarkable Adrenals/Urinary Tract: The kidneys, adrenal glands and bladder are unremarkable. Stomach/Bowel: Stomach is within normal limits. No evidence of bowel wall thickening, distention, or inflammatory changes. Lymphatic: No enlarged lymph nodes. Reproductive: Status post hysterectomy. No adnexal masses. Other: No ascites, focal collection or pneumoperitoneum. Musculoskeletal: No acute or suspicious bony abnormalities. Review of the MIP images confirms the above findings. IMPRESSION: 1. No evidence of acute abnormality. No aortic aneurysm or dissection. 2. No other significant abnormalities. Electronically Signed   By: JMargarette CanadaM.D.   On: 09/27/2018 20:07    EKG: Independently reviewed.  Sinus tachycardia, no acute ischemic  changes.  Assessment/Plan Principal Problem:   Near syncope Active Problems:   Diabetes mellitus with complication ([facility])   Hyperlipidemia associated with type 2 diabetes mellitus ([place]   Hypertension associated with diabetes ([place]   Sarcoidosis   Hypokalemia  DTHURLEY FRANCESCONIis a 57y.o. female with medical history significant of hypertension, hyperlipidemia, diabetes, sarcoidosis who presented from her PCP office after a near syncopal episode.  Near-syncope:  Likely vasovagal/orthostatic.  No obvious cardiac arrhythmia or seizure-like activity.  Patient symptomatically improved after small fluid bolus.  Will observe overnight. -Check orthostatic vitals -Monitor telemetry overnight  Hypokalemia: K 3.0. Replete 40 mEq po.  Repeat BMP in a.m.  Diabetes: SSI  HTN: Blood pressure elevated on admission.  Resume home amlodipine, lisinopril-HCTZ.  HLD: Resume home pravastatin  Sarcoidosis: Resume home azathioprine 50 mg p.o. daily  DVT prophylaxis: Lovenox  Code Status: Full  Family Communication: No family bedside Disposition Plan: DC to home in approximately 1 day Consults called: None Admission status: Telemetry obs   Zada Finders MD [facility] Pager [phone]  If 7PM-7AM, please contact night-coverage [email or web address] Password [email or web address]  09/28/2018, 2:22 AM

## 2018-09-27 NOTE — ED Provider Notes (Signed)
[facility] EMERGENCY DEPARTMENT Provider Note   CSN: [id number] Arrival date & time: 09/27/18  1557     History   Chief Complaint Chief Complaint  Patient presents with  . Near Syncope    HPI Bethany Webb is a 57 y.o. female.  HPI She is obtained from patient and from Mr. Tysinger, PA who briefly saw her at Dr.Lalonde's this prior to calling 911.  Patient complains of bilateral arm pain and bilateral leg pain for 1 week.  She went to Dr. Lanice Shirts office today for evaluation of extremity pain while there she began to vomit multiple times and became transiently lightheaded and suffered near syncopal event.  She did not lose consciousness.  Mr. Glade Lloyd said she had a pulse of 182 and blood pressure of 170/111 immediately prior to calling 911.  EMS treated patient with Zofran 4 mg IV and she was administered saline 500 mL bolus intravenous prior to coming here.  Presently nausea is improved and extremity pain is improved.  She denies any chest pain or abdominal pain.  No other associated symptoms.  Nothing makes symptoms better or worse Past Medical History:  Diagnosis Date  . 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 Allergy   . Biceps tendonitis 2/12   Dr. Gladstone Lighter  . Diabetes mellitus    type II  . History of blood transfusion 1988   hysterectomy  . History of mammogram 05/25/12   stable, no suspicious finding, extremely dense breast tissue  . Hyperlipidemia   . Hypertension   . Neutropenia, unspecified ([place]) 02/2013   slight, likely ethnicity related  . Pneumococcal vaccine refused 03/2013  . Refused influenza vaccine 03/2013  . S/P hysterectomy   . Sarcoidosis   . Shortness of breath dyspnea    with exertion   . Supraspinatus tendonitis 2/12   Dr. Gladstone Lighter  . Wears glasses     Patient Active Problem List   Diagnosis Date Noted  . Tachycardia 09/27/2018  . Diffuse pain  09/27/2018  . Paresthesias in left hand 08/31/2018  . Carpal tunnel syndrome on left 08/31/2018  . Arthralgia of hand, right 08/31/2018  . Abdominal pain 06/08/2018  . Elevated lipase 06/08/2018  . Radicular pain of lower extremity 06/08/2018  . Acute bilateral low back pain with sciatica 06/08/2018  . Paresthesia 04/30/2018  . Weight gain 01/09/2018  . High risk medication use 01/09/2018  . Need for influenza vaccination 09/21/2017  . Vaccine counseling 09/21/2017  . Nausea and vomiting 09/15/2016  . Abdominal pain, epigastric 09/15/2016  . RUQ pain 09/15/2016  . 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 Sarcoidosis 01/21/2016  . 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 Abnormal liver function 01/21/2016  . Elevated liver enzymes 01/05/2016  . Abnormal chest CT 01/05/2016  . Lymphadenopathy, mediastinal 01/05/2016  . Pleural plaque 01/05/2016  . Pulmonary nodule 01/05/2016  . Anorexia 01/05/2016  . Night sweats 01/05/2016  . Allergic conjunctivitis 10/18/2011  . Allergic rhinitis 10/18/2011  . Diabetes mellitus with complication ([place]) 34/28/7681  . Hyperlipidemia associated with type 2 diabetes mellitus ([place]) 10/06/2011  . Essential hypertension, benign 10/06/2011    Past Surgical History:  Procedure Laterality Date  . ABDOMINAL HYSTERECTOMY  1990   and BSO; endometriosis  . ANTERIOR CERVICAL DECOMP/DISCECTOMY FUSION N/A 06/20/2013   Procedure: ANTERIOR CERVICAL DECOMPRESSION/DISCECTOMY FUSION 1 LEVEL;  Surgeon: Ophelia Charter, MD;  Location: [facility];  Service: Neurosurgery;  Laterality: N/A;  C45 anterior cervical decompression with fusion interbody prothesis plating and bonegraft  . BREAST REDUCTION SURGERY  1994  . CARPAL TUNNEL RELEASE Right 06/20/2013   Procedure: CARPAL TUNNEL RELEASE;  Surgeon: Ophelia Charter, MD;  Location: [facility];  Service: Neurosurgery;  Laterality: Right;  RIGHT carpal tunnel release  .  COLONOSCOPY  03/06/12   sigmoid polyp; repeat 10 years; Dr. Tora Duck  . LUNG SURGERY  1987   endometriosis in lungs  . SUPRACLAVICAL NODE BIOPSY Left 01/14/2016   Procedure: LEFT SUPRACLAVICAL NODE BIOPSY;  Surgeon: Melrose Nakayama, MD;  Location: [facility];  Service: Thoracic;  Laterality: Left;     OB History    Gravida  2   Para  1   Term      Preterm      AB      Living  1     SAB      TAB      Ectopic      Multiple      Live Births               Home Medications    Prior to Admission medications   Medication Sig Start Date End Date Taking? Authorizing Provider  amLODipine (NORVASC) 10 MG tablet TAKE 1 TABLET BY MOUTH EVERY DAY 08/20/18   Tysinger, Camelia Eng, PA-C  azaTHIOprine (IMURAN) 50 MG tablet Take 50 mg by mouth daily.    [provider]  BAYER CONTOUR TEST test strip TEST BLOOD SUGAR TWICE DAILY 10/30/17   Tysinger, Camelia Eng, PA-C  blood glucose meter kit and supplies KIT Dispense based on patient and insurance preference. Use up to four times daily as directed. (FOR ICD-9 250.00, 250.01). 03/08/16   Tysinger, Camelia Eng, PA-C  cholecalciferol (VITAMIN D) 1000 units tablet Take 1 tablet (1,000 Units total) by mouth daily. Patient not taking: Reported on 06/04/2018 04/30/18   Tysinger, Camelia Eng, PA-C  glucose blood (ONE TOUCH ULTRA TEST) test strip Use as instructed 11/08/17   Carlena Hurl, PA-C  HUMALOG MIX 75/25 KWIKPEN (75-25) 100 UNIT/ML Kwikpen ADMINISTER 50 UNITS UNDER THE SKIN THREE TIMES DAILY 07/10/18   Tysinger, Camelia Eng, PA-C  ibuprofen (ADVIL,MOTRIN) 800 MG tablet Take 1 tablet (800 mg total) 3 (three) times daily by mouth. 10/31/17   Mackuen, Courteney Lyn, MD  Insulin Pen Needle (BD PEN NEEDLE NANO U/F) 32G X 4 MM MISC INJECT THREE TIMES DAILY AS DIRECTED 01/26/18   Tysinger, Camelia Eng, PA-C  Lancets 30G MISC Test blood sugars twice daily 09/14/15   Tysinger, Camelia Eng, PA-C  lisinopril-hydrochlorothiazide (PRINZIDE,ZESTORETIC) 20-25 MG tablet  Take 1 tablet by mouth daily. 01/26/18   Tysinger, Camelia Eng, PA-C  metFORMIN (GLUCOPHAGE-XR) 500 MG 24 hr tablet Take 1 tablet (500 mg total) by mouth daily with breakfast. 2 tablets daily 04/30/18   Tysinger, Camelia Eng, PA-C  nystatin cream (MYCOSTATIN) Apply 1 application topically 2 (two) times daily. Patient not taking: Reported on 07/10/2018 09/21/17   Carlena Hurl, PA-C  pravastatin (PRAVACHOL) 40 MG tablet TAKE 1 TABLET(40 MG) BY MOUTH DAILY 01/26/18   Tysinger, Camelia Eng, PA-C  Semaglutide (OZEMPIC) 1 MG/DOSE SOPN Inject 1 mg into the skin once a week. 04/30/18   Tysinger, Camelia Eng, PA-C    Family History Family History  Problem Relation Age of Onset  . Heart disease Father 27       died of MI,  CABG   . Multiple sclerosis Sister   . Asthma Brother   . Cancer Paternal Grandmother        breast  . Diabetes Other        maternal and paternal grandmother  . Cancer Maternal Uncle        hematologic  . Hypertension Maternal Grandmother   . Cancer Maternal Grandfather        prostate  . Hypertension Maternal Grandfather   . Asthma Brother   . Eczema Sister   . Allergies Sister   . Healthy Daughter     Social History Social History   Tobacco Use  . Smoking status: Never Smoker  . Smokeless tobacco: Never Used  Substance Use Topics  . Alcohol use: Yes    Alcohol/week: 0.0 standard drinks    Comment: rare wine, maybe 2-3 times years  . Drug use: No     Allergies   Codeine; Gabapentin; and Metformin and related   Review of Systems Review of Systems  Constitutional: Negative.   HENT: Negative.   Respiratory: Negative.   Cardiovascular: Negative.   Gastrointestinal: Positive for nausea and vomiting.  Musculoskeletal: Positive for myalgias.  Skin: Negative.   Allergic/Immunologic: Positive for immunocompromised state.  Neurological: Negative.   Psychiatric/Behavioral: Negative.   All other systems reviewed and are negative.    Physical Exam Updated Vital Signs BP  127/71   Pulse (!) 108   Temp 98.8 F (37.1 C) (Oral)   Resp 18   LMP 12/27/1987   SpO2 100%   Physical Exam  Constitutional: She appears well-developed and well-nourished.  HENT:  Head: Normocephalic and atraumatic.  Eyes: Pupils are equal, round, and reactive to light. Conjunctivae are normal.  Neck: Neck supple. No tracheal deviation present. No thyromegaly present.  Cardiovascular: Regular rhythm.  No murmur heard. Tachycardic mild  Pulmonary/Chest: Effort normal and breath sounds normal.  Abdominal: Soft. Bowel sounds are normal. She exhibits no distension. There is no tenderness.  Musculoskeletal: Normal range of motion. She exhibits no edema or tenderness.  Neurological: She is alert. Coordination normal.  Skin: Skin is warm and dry. No rash noted.  Psychiatric: She has a normal mood and affect.  Nursing note and vitals reviewed.    ED Treatments / Results  Labs (all labs ordered are listed, but only abnormal results are displayed) Labs Reviewed  BASIC METABOLIC PANEL  CBC WITH DIFFERENTIAL/PLATELET  I-STAT TROPONIN, ED   ED ECG REPORT   Date: 09/27/2018  Rate: 110  Rhythm: sinus tachycardia  QRS Axis: normal  Intervals: normal  ST/T Wave abnormalities: nonspecific T wave changes  Conduction Disutrbances:none  Narrative Interpretation:   Old EKG Reviewed: unchanged No significant change from 01/14/2016 I have personally reviewed the EKG tracing and agree with the computerized printout as noted. EKG None  Radiology No results found.  Procedures Procedures (including critical care time)  Medications Ordered in ED Medications - No data to display   Initial Impression / Assessment and Plan / ED Course  I have reviewed the triage vital signs and the nursing notes.  Pertinent labs & imaging results that were available during my care of the patient were reviewed by me and considered in my medical decision making (see chart for details).     8:30 PM  patient resting comfortably.  No distress.  She feels much improved. Results for orders placed or performed during the hospital encounter of 78/67/54  Basic metabolic panel  Result Value Ref Range   Sodium  139 135 - 145 mmol/L   Potassium 3.0 (L) 3.5 - 5.1 mmol/L   Chloride 100 98 - 111 mmol/L   CO2 22 22 - 32 mmol/L   Glucose, Bld 103 (H) 70 - 99 mg/dL   BUN 14 6 - 20 mg/dL   Creatinine, Ser 0.85 0.44 - 1.00 mg/dL   Calcium 9.9 8.9 - 10.3 mg/dL   GFR calc non Af Amer >60 >60 mL/min   GFR calc Af Amer >60 >60 mL/min   Anion gap 17 (H) 5 - 15  CBC with Differential/Platelet  Result Value Ref Range   WBC 3.8 (L) 4.0 - 10.5 K/uL   RBC 4.14 3.87 - 5.11 MIL/uL   Hemoglobin 12.3 12.0 - 15.0 g/dL   HCT 36.9 36.0 - 46.0 %   MCV 89.1 78.0 - 100.0 fL   MCH 29.7 26.0 - 34.0 pg   MCHC 33.3 30.0 - 36.0 g/dL   RDW 13.2 11.5 - 15.5 %   Platelets 342 150 - 400 K/uL   Neutrophils Relative % 54 %   Neutro Abs 2.1 1.7 - 7.7 K/uL   Lymphocytes Relative 30 %   Lymphs Abs 1.1 0.7 - 4.0 K/uL   Monocytes Relative 13 %   Monocytes Absolute 0.5 0.1 - 1.0 K/uL   Eosinophils Relative 1 %   Eosinophils Absolute 0.1 0.0 - 0.7 K/uL   Basophils Relative 1 %   Basophils Absolute 0.0 0.0 - 0.1 K/uL   Immature Granulocytes 1 %   Abs Immature Granulocytes 0.0 0.0 - 0.1 K/uL  I-stat troponin, ED  Result Value Ref Range   Troponin i, poc 0.00 0.00 - 0.08 ng/mL   Comment 3           Ct Angio Chest/abd/pel For Dissection W And/or W/wo  Result Date: 09/27/2018 CLINICAL DATA:  57 year old female with acute chest, abdominal and pelvic pain and near syncope. History of sarcoid. EXAM: CT ANGIOGRAPHY CHEST, ABDOMEN AND PELVIS TECHNIQUE: Multidetector CT imaging through the chest, abdomen and pelvis was performed using the standard protocol during bolus administration of intravenous contrast. Multiplanar reconstructed images and MIPs were obtained and reviewed to evaluate the vascular anatomy. CONTRAST:  161m  ISOVUE-370 IOPAMIDOL (ISOVUE-370) INJECTION 76% COMPARISON:  08/24/2016 chest CT, 01/21/2016 PET CT and 12/17/2015 abdomen/pelvic CT. FINDINGS: CTA CHEST FINDINGS Cardiovascular: Heart size normal. There is no evidence of thoracic aortic aneurysm or dissection. No pericardial effusion. No large or central pulmonary emboli identified. Mediastinum/Nodes: No enlarged mediastinal, hilar, or axillary lymph nodes. Thyroid gland, trachea, and esophagus demonstrate no significant findings. Lungs/Pleura: No airspace disease, consolidation, suspicious nodule, mass, pleural effusion or pneumothorax. Bilateral pulmonary nodules are noted, stable to decreased in size from 2017. Calcified pleural thickening on the LEFT is again noted. Musculoskeletal: No acute or suspicious bony abnormalities. Review of the MIP images confirms the above findings. CTA ABDOMEN AND PELVIS FINDINGS VASCULAR Aorta: Normal caliber aorta without aneurysm, dissection, vasculitis or significant stenosis. Celiac: Patent without evidence of aneurysm, dissection, vasculitis or significant stenosis. SMA: Patent without evidence of aneurysm, dissection, vasculitis or significant stenosis. Renals: Both renal arteries are patent without evidence of aneurysm, dissection, vasculitis, fibromuscular dysplasia or significant stenosis. IMA: Patent without evidence of aneurysm, dissection, vasculitis or significant stenosis. Inflow: Patent without evidence of aneurysm, dissection, vasculitis or significant stenosis. Veins: No obvious venous abnormality within the limitations of this arterial phase study. Review of the MIP images confirms the above findings. NON-VASCULAR Hepatobiliary: The liver and gallbladder are unremarkable. No biliary dilatation.  Pancreas: Unremarkable Spleen: Unremarkable Adrenals/Urinary Tract: The kidneys, adrenal glands and bladder are unremarkable. Stomach/Bowel: Stomach is within normal limits. No evidence of bowel wall thickening,  distention, or inflammatory changes. Lymphatic: No enlarged lymph nodes. Reproductive: Status post hysterectomy. No adnexal masses. Other: No ascites, focal collection or pneumoperitoneum. Musculoskeletal: No acute or suspicious bony abnormalities. Review of the MIP images confirms the above findings. IMPRESSION: 1. No evidence of acute abnormality. No aortic aneurysm or dissection. 2. No other significant abnormalities. Electronically Signed   By: Margarette Canada M.D.   On: 09/27/2018 20:07  Potassium ordered as lab work remarkable for hypokalemia.  Serum magnesium pending.  I consulted Dr. Posey Pronto who arrange for overnight stay CT angiogram has ruled out aortic dissection Final Clinical Impressions(s) / ED Diagnoses  Dx #1 near syncope #2 myalgias #3 hypokalemia Final diagnoses:  None    ED Discharge Orders    None       Orlie Dakin, MD 09/27/18 2059

## 2018-09-27 NOTE — ED Notes (Signed)
Pt ambulated to restroom without assistance.

## 2018-09-27 NOTE — Telephone Encounter (Signed)
Patient returned Pulmonary rehab phone call, patient stated she is interested in participating in the Pulmonary Rehab Program. Patient will come in for orientation on 10/26/18 @ 9:30AM and will attend the 10:30AM exercise class.  Mailed homework package.  Went over insurance, patient verbalized understanding.

## 2018-09-27 NOTE — ED Triage Notes (Signed)
Pt arrived via [facility] from MD office after a near syncopal episode. Pt c/o generalized pain and weakness. Pt is alert and oriented x4. 128/62, hr 114, rr 22, cbg 123, 99% ra. 4mg  zofran, 520mL NS given PTA.

## 2018-09-27 NOTE — Progress Notes (Signed)
Subjective:  Bethany Webb is a 57 y.o. female who presents for Chief Complaint  Patient presents with  . back pain    back and legs and arms and feet- off and on for a week consent for 3 days.      Here today for not feeling well.  She is a work in.   (I was off duty, but summoned to front of the building when she came in sick appearing)  She apparently walked in the clinic and collapsed in one of our office workers arms feeling so bad.   Has apparently had nausea and vomiting multiple times today has been feeling bad for 3 days and constant pain all over, back, arms, legs.     She has a history for diabetes, high blood pressure, sarcoidosis and is managed by a specialist for sarcoidosis out of the area.  She has complicated sarcoidosis history with intermittent about of extreme fatigue and pain.   No prior pain quite this bad.  She normally maintains a good blood pressure but feels horrible today.  History is limited as she is having uncontrollable vomiting and feeling weak.  She drove herself today.  No other aggravating or relieving factors.    No other c/o.  The following portions of the patient's history were reviewed and updated as appropriate: allergies, current medications, past family history, past medical history, past social history, past surgical history and problem list.  ROS Otherwise as in subjective above  Past Medical History:  Diagnosis Date  . 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 Allergy   . Biceps tendonitis 2/12   Dr. Gladstone Lighter  . Diabetes mellitus    type II  . History of blood transfusion 1988   hysterectomy  . History of mammogram 05/25/12   stable, no suspicious finding, extremely dense breast tissue  . Hyperlipidemia   . Hypertension   . Neutropenia, unspecified ([place]) 02/2013   slight, likely ethnicity related  . Pneumococcal vaccine refused 03/2013  . Refused influenza  vaccine 03/2013  . S/P hysterectomy   . Sarcoidosis   . Shortness of breath dyspnea    with exertion   . Supraspinatus tendonitis 2/12   Dr. Gladstone Lighter  . Wears glasses    Current Outpatient Medications on File Prior to Visit  Medication Sig Dispense Refill  . amLODipine (NORVASC) 10 MG tablet TAKE 1 TABLET BY MOUTH EVERY DAY 90 tablet 0  . azaTHIOprine (IMURAN) 50 MG tablet Take 50 mg by mouth daily.    Marland Kitchen BAYER CONTOUR TEST test strip TEST BLOOD SUGAR TWICE DAILY 100 each 0  . blood glucose meter kit and supplies KIT Dispense based on patient and insurance preference. Use up to four times daily as directed. (FOR ICD-9 250.00, 250.01). 1 each 0  . cholecalciferol (VITAMIN D) 1000 units tablet Take 1 tablet (1,000 Units total) by mouth daily. (Patient not taking: Reported on 06/04/2018) 90 tablet 3  . glucose blood (ONE TOUCH ULTRA TEST) test strip Use as instructed 100 each 12  . HUMALOG MIX 75/25 KWIKPEN (75-25) 100 UNIT/ML Kwikpen ADMINISTER 50 UNITS UNDER THE SKIN THREE TIMES DAILY 15 mL 3  . ibuprofen (ADVIL,MOTRIN) 800 MG tablet Take 1 tablet (800 mg total) 3 (three) times daily by mouth. 21 tablet 0  . Insulin Pen Needle (BD PEN NEEDLE NANO U/F) 32G X 4 MM MISC INJECT THREE TIMES DAILY AS DIRECTED 100 each  11  . Lancets 30G MISC Test blood sugars twice daily 100 each 2  . lisinopril-hydrochlorothiazide (PRINZIDE,ZESTORETIC) 20-25 MG tablet Take 1 tablet by mouth daily. 90 tablet 3  . metFORMIN (GLUCOPHAGE-XR) 500 MG 24 hr tablet Take 1 tablet (500 mg total) by mouth daily with breakfast. 2 tablets daily 180 tablet 0  . nystatin cream (MYCOSTATIN) Apply 1 application topically 2 (two) times daily. (Patient not taking: Reported on 07/10/2018) 30 g 2  . pravastatin (PRAVACHOL) 40 MG tablet TAKE 1 TABLET(40 MG) BY MOUTH DAILY 90 tablet 3  . Semaglutide (OZEMPIC) 1 MG/DOSE SOPN Inject 1 mg into the skin once a week. 1.5 mL 11   No current facility-administered medications on file prior to  visit.       Objective: BP (!) 180/110   Pulse (!) 178   Temp 98.6 F (37 C) (Oral)   LMP 12/27/1987   SpO2 97%   BP Readings from Last 3 Encounters:  09/27/18 (!) 180/110  09/04/18 126/80  08/31/18 128/74    General appearance: weak, vomiting, initially I observed her in bathroom over toilet Somewhat dehydrated appearance Neck: supple, no lymphadenopathy, no thyromegaly, no masses Heart: tachycardic, normal S1, S2, no murmurs Lungs: CTA bilaterally, no wheezes, rhonchi, or rales Pulses: 2+ pulses Ext: no edema    Assessment: Encounter Diagnoses  Name Primary?  . Tachycardia Yes  . Nausea and vomiting, intractability of vomiting not specified, unspecified vomiting type   . Diffuse pain   . Essential hypertension, benign   . Diabetes mellitus with complication ([place])   . 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  We finally got her to room and vital signs revealed tachycardic at 180s.  She had uncontrollable vomiting the whole time she was here.  She looks weak and dehydrated.  Reviewed EKG showing sinus tachycardia questionable ST abnormality, blood pressure elevated today  EMS was called.    She conitnued to vomit and gag, seated in wheel chair.    Pulse ox normal.      Bethany Webb was seen today for back pain.  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

## 2018-09-28 DIAGNOSIS — R55 Syncope and collapse: Secondary | ICD-10-CM | POA: Diagnosis not present

## 2018-09-28 DIAGNOSIS — E876 Hypokalemia: Secondary | ICD-10-CM | POA: Diagnosis not present

## 2018-09-28 DIAGNOSIS — E1169 Type 2 diabetes mellitus with other specified complication: Secondary | ICD-10-CM | POA: Diagnosis not present

## 2018-09-28 DIAGNOSIS — R112 Nausea with vomiting, unspecified: Secondary | ICD-10-CM

## 2018-09-28 DIAGNOSIS — E1159 Type 2 diabetes mellitus with other circulatory complications: Secondary | ICD-10-CM | POA: Diagnosis not present

## 2018-09-28 LAB — BASIC METABOLIC PANEL
Anion gap: 9 (ref 5–15)
Anion gap: 10 (ref 5–15)
BUN: 11 mg/dL (ref 6–20)
BUN: 9 mg/dL (ref 6–20)
CALCIUM: 9.3 mg/dL (ref 8.9–10.3)
CALCIUM: 8.8 mg/dL — AB (ref 8.9–10.3)
CHLORIDE: 102 mmol/L (ref 98–111)
CO2: 26 mmol/L (ref 22–32)
CO2: 23 mmol/L (ref 22–32)
CREATININE: 0.93 mg/dL (ref 0.44–1.00)
CREATININE: 0.8 mg/dL (ref 0.44–1.00)
Chloride: 105 mmol/L (ref 98–111)
GFR calc Af Amer: >60 mL/min (ref >60)
GFR calc non Af Amer: >60 mL/min (ref >60)
GFR calc non Af Amer: >60 mL/min (ref >60)
GLUCOSE: 98 mg/dL (ref 70–99)
Glucose, Bld: 93 mg/dL (ref 70–99)
Potassium: 3.2 mmol/L — ABNORMAL LOW (ref 3.5–5.1)
Potassium: 3.6 mmol/L (ref 3.5–5.1)
SODIUM: 137 mmol/L (ref 135–145)
Sodium: 138 mmol/L (ref 135–145)

## 2018-09-28 LAB — GLUCOSE, CAPILLARY
GLUCOSE-CAPILLARY: 82 mg/dL (ref 70–99)
Glucose-Capillary: 84 mg/dL (ref 70–99)
Glucose-Capillary: 101 mg/dL — ABNORMAL HIGH (ref 70–99)
Glucose-Capillary: 139 mg/dL — ABNORMAL HIGH (ref 70–99)

## 2018-09-28 LAB — LIPASE, BLOOD: Lipase: 88 U/L — ABNORMAL HIGH (ref 11–51)

## 2018-09-28 LAB — HIV ANTIBODY (ROUTINE TESTING W REFLEX): HIV Screen 4th Generation wRfx: NONREACTIVE

## 2018-09-28 MED ORDER — SODIUM CHLORIDE 0.9 % IV BOLUS
1000.0000 mL | Freq: Once | INTRAVENOUS | Status: AC
  Administered 2018-09-28: 1000 mL via INTRAVENOUS

## 2018-09-28 MED ORDER — PRAVASTATIN SODIUM 40 MG PO TABS
40.0000 mg | ORAL_TABLET | Freq: Every day | ORAL | Status: DC
  Administered 2018-09-28 – 2018-09-29 (×2): 40 mg via ORAL
  Filled 2018-09-28 (×2): qty 1

## 2018-09-28 MED ORDER — LISINOPRIL 20 MG PO TABS
20.0000 mg | ORAL_TABLET | Freq: Every day | ORAL | Status: DC
  Administered 2018-09-28 – 2018-09-29 (×2): 20 mg via ORAL
  Filled 2018-09-28 (×2): qty 1

## 2018-09-28 MED ORDER — PROMETHAZINE HCL 25 MG/ML IJ SOLN
6.2500 mg | Freq: Four times a day (QID) | INTRAMUSCULAR | Status: DC | PRN
  Filled 2018-09-28: qty 1

## 2018-09-28 MED ORDER — POTASSIUM CHLORIDE IN NACL 20-0.9 MEQ/L-% IV SOLN
INTRAVENOUS | Status: DC
  Administered 2018-09-28: 18:00:00 via INTRAVENOUS
  Filled 2018-09-28 (×2): qty 1000

## 2018-09-28 MED ORDER — MAGNESIUM SULFATE 2 GM/50ML IV SOLN
2.0000 g | Freq: Once | INTRAVENOUS | Status: AC
  Administered 2018-09-28: 2 g via INTRAVENOUS
  Filled 2018-09-28: qty 50

## 2018-09-28 MED ORDER — INSULIN ASPART 100 UNIT/ML ~~LOC~~ SOLN: 0.0000 [IU] | Freq: Three times a day (TID) | SUBCUTANEOUS | Status: DC

## 2018-09-28 MED ORDER — LISINOPRIL-HYDROCHLOROTHIAZIDE 20-25 MG PO TABS: 1.0000 | ORAL_TABLET | Freq: Every day | ORAL | Status: DC

## 2018-09-28 MED ORDER — HYDROCHLOROTHIAZIDE 25 MG PO TABS
25.0000 mg | ORAL_TABLET | Freq: Every day | ORAL | Status: DC
  Filled 2018-09-28: qty 1

## 2018-09-28 MED ORDER — POTASSIUM CHLORIDE 10 MEQ/100ML IV SOLN
10.0000 meq | INTRAVENOUS | Status: DC
  Administered 2018-09-28: 10 meq via INTRAVENOUS

## 2018-09-28 MED ORDER — AMLODIPINE BESYLATE 10 MG PO TABS
10.0000 mg | ORAL_TABLET | Freq: Every day | ORAL | Status: DC
  Administered 2018-09-28 – 2018-09-29 (×2): 10 mg via ORAL
  Filled 2018-09-28 (×2): qty 1

## 2018-09-28 MED ORDER — POTASSIUM CHLORIDE 10 MEQ/100ML IV SOLN
10.0000 meq | INTRAVENOUS | Status: DC
  Administered 2018-09-28 (×2): 10 meq via INTRAVENOUS
  Filled 2018-09-28: qty 100

## 2018-09-28 MED ORDER — AZATHIOPRINE 50 MG PO TABS
50.0000 mg | ORAL_TABLET | Freq: Every day | ORAL | Status: DC
  Administered 2018-09-28 – 2018-09-29 (×2): 50 mg via ORAL
  Filled 2018-09-28 (×2): qty 1

## 2018-09-28 NOTE — Progress Notes (Addendum)
Progress Note    Bethany Webb  AST:[id number] DOB: 03-28-1961  DOA: 09/27/2018 PCP: Carlena Hurl, PA-C    Brief Narrative:     Medical records reviewed and are as summarized below:  Bethany Webb is a 57 y.o. female with medical history significant of hypertension, hyperlipidemia, diabetes, sarcoidosis who presented from her PCP office after a near syncopal episode.  Assessment/Plan:   Principal Problem:   Near syncope Active Problems:   Diabetes mellitus with complication (HCC)   Hyperlipidemia associated with type 2 diabetes mellitus ([place])   Hypertension associated with diabetes ([place])   Sarcoidosis   Hypokalemia  Near-syncope: Likely due to vomiting -no further episodes  N/V plus early satiety -patient had been off ozempic for several months and recently started back at full dose -suspect the above triggered her symptoms -may need gastric emptying study if continues   Hypokalemia: -replete  Diabetes: SSI -hold orals -d/c ozempic -HgbA1c: 6.6  HTN:  Resume home amlodipine, lisinopril-HCTZ.  HLD: Resume home pravastatin  Sarcoidosis: Resume home azathioprine 50 mg p.o. daily   Family Communication/Anticipated D/C date and plan/Code Status   DVT prophylaxis: Lovenox ordered. Code Status: Full Code.  Family Communication:  Disposition Plan:      Subjective:   Able to take a few more bites this PM than this AM   Objective:    Vitals:   09/27/18 2200 09/28/18 0648 09/28/18 1155 09/28/18 1426  BP:  108/61 127/77 116/64  Pulse:  (!) 110 98 98  Resp:  16 17 17   Temp:  98.1 F (36.7 C) 98.2 F (36.8 C) 98 F (36.7 C)  TempSrc:  Oral Oral Oral  SpO2:  97% 96% 100%  Weight: 74.8 kg     Height: 5\' 4"  (1.626 m)       Intake/Output Summary (Last 24 hours) at 09/28/2018 1650 Last data filed at 09/28/2018 0912 Gross per 24 hour  Intake 120 ml  Output -  Net 120 ml   Filed Weights   09/27/18 2200  Weight: 74.8 kg     Exam: In bed Upon re-eval this PM looks more comfortable +BS, min epigastric tenderness No LE edema alert  Data Reviewed:   I have personally reviewed following labs and imaging studies:  Labs: Labs show the following:   Basic Metabolic Panel: Recent Labs  Lab 09/27/18 1745 09/28/18 0734 09/28/18 1506  NA 139 137 138  K 3.0* 3.2* 3.6  CL 100 102 105  CO2 22 26 23   GLUCOSE 103* 93 98  BUN 14 11 9   CREATININE 0.85 0.93 0.80  CALCIUM 9.9 9.3 8.8*  MG 1.7  --   --    GFR Estimated Creatinine Clearance: 74.9 mL/min (by C-G formula based on SCr of 0.8 mg/dL). Liver Function Tests: No results for input(s): AST, ALT, ALKPHOS, BILITOT, PROT, ALBUMIN in the last 168 hours. Recent Labs  Lab 09/28/18 1506  LIPASE 88*   No results for input(s): AMMONIA in the last 168 hours. Coagulation profile No results for input(s): INR, PROTIME in the last 168 hours.  CBC: Recent Labs  Lab 09/27/18 1745  WBC 3.8*  NEUTROABS 2.1  HGB 12.3  HCT 36.9  MCV 89.1  PLT 342   Cardiac Enzymes: No results for input(s): CKTOTAL, CKMB, CKMBINDEX, TROPONINI in the last 168 hours. BNP (last 3 results) No results for input(s): PROBNP in the last 8760 hours. CBG: Recent Labs  Lab 09/28/18 0718 09/28/18 1102  GLUCAP 82 84  D-Dimer: No results for input(s): DDIMER in the last 72 hours. Hgb A1c: No results for input(s): HGBA1C in the last 72 hours. Lipid Profile: No results for input(s): CHOL, HDL, LDLCALC, TRIG, CHOLHDL, LDLDIRECT in the last 72 hours. Thyroid function studies: No results for input(s): TSH, T4TOTAL, T3FREE, THYROIDAB in the last 72 hours.  Invalid input(s): FREET3 Anemia work up: No results for input(s): VITAMINB12, FOLATE, FERRITIN, TIBC, IRON, RETICCTPCT in the last 72 hours. Sepsis Labs: Recent Labs  Lab 09/27/18 1745  WBC 3.8*    Microbiology No results found for this or any previous visit (from the past 240 hour(s)).  Procedures and diagnostic  studies:  Ct Angio Chest/abd/pel For Dissection W And/or W/wo  Result Date: 09/27/2018 CLINICAL DATA:  57 year old female with acute chest, abdominal and pelvic pain and near syncope. History of sarcoid. EXAM: CT ANGIOGRAPHY CHEST, ABDOMEN AND PELVIS TECHNIQUE: Multidetector CT imaging through the chest, abdomen and pelvis was performed using the standard protocol during bolus administration of intravenous contrast. Multiplanar reconstructed images and MIPs were obtained and reviewed to evaluate the vascular anatomy. CONTRAST:  153mL ISOVUE-370 IOPAMIDOL (ISOVUE-370) INJECTION 76% COMPARISON:  08/24/2016 chest CT, 01/21/2016 PET CT and 12/17/2015 abdomen/pelvic CT. FINDINGS: CTA CHEST FINDINGS Cardiovascular: Heart size normal. There is no evidence of thoracic aortic aneurysm or dissection. No pericardial effusion. No large or central pulmonary emboli identified. Mediastinum/Nodes: No enlarged mediastinal, hilar, or axillary lymph nodes. Thyroid gland, trachea, and esophagus demonstrate no significant findings. Lungs/Pleura: No airspace disease, consolidation, suspicious nodule, mass, pleural effusion or pneumothorax. Bilateral pulmonary nodules are noted, stable to decreased in size from 2017. Calcified pleural thickening on the LEFT is again noted. Musculoskeletal: No acute or suspicious bony abnormalities. Review of the MIP images confirms the above findings. CTA ABDOMEN AND PELVIS FINDINGS VASCULAR Aorta: Normal caliber aorta without aneurysm, dissection, vasculitis or significant stenosis. Celiac: Patent without evidence of aneurysm, dissection, vasculitis or significant stenosis. SMA: Patent without evidence of aneurysm, dissection, vasculitis or significant stenosis. Renals: Both renal arteries are patent without evidence of aneurysm, dissection, vasculitis, fibromuscular dysplasia or significant stenosis. IMA: Patent without evidence of aneurysm, dissection, vasculitis or significant stenosis. Inflow:  Patent without evidence of aneurysm, dissection, vasculitis or significant stenosis. Veins: No obvious venous abnormality within the limitations of this arterial phase study. Review of the MIP images confirms the above findings. NON-VASCULAR Hepatobiliary: The liver and gallbladder are unremarkable. No biliary dilatation. Pancreas: Unremarkable Spleen: Unremarkable Adrenals/Urinary Tract: The kidneys, adrenal glands and bladder are unremarkable. Stomach/Bowel: Stomach is within normal limits. No evidence of bowel wall thickening, distention, or inflammatory changes. Lymphatic: No enlarged lymph nodes. Reproductive: Status post hysterectomy. No adnexal masses. Other: No ascites, focal collection or pneumoperitoneum. Musculoskeletal: No acute or suspicious bony abnormalities. Review of the MIP images confirms the above findings. IMPRESSION: 1. No evidence of acute abnormality. No aortic aneurysm or dissection. 2. No other significant abnormalities. Electronically Signed   By: Margarette Canada M.D.   On: 09/27/2018 20:07    Medications:   . amLODipine  10 mg Oral Daily  . azaTHIOprine  50 mg Oral Daily  . enoxaparin (LOVENOX) injection  40 mg Subcutaneous Q24H  . lisinopril  20 mg Oral Daily   And  . hydrochlorothiazide  25 mg Oral Daily  . Influenza vac split quadrivalent PF  0.5 mL Intramuscular Tomorrow-1000  . insulin aspart  0-9 Units Subcutaneous TID WC  . pravastatin  40 mg Oral Daily  . sodium chloride flush  3 mL Intravenous Q12H  Continuous Infusions: . 0.9 % NaCl with KCl 20 mEq / L       LOS: 0 days   Geradine Girt  [facility]   *Please refer to [email or web address], password [email or web address] to get updated schedule on who will round on this patient, as hospitalists switch teams weekly. If 7PM-7AM, please contact night-coverage at [email or web address], password [email or web address] for any overnight needs.  09/28/2018, 4:50 PM

## 2018-09-29 DIAGNOSIS — R55 Syncope and collapse: Secondary | ICD-10-CM | POA: Diagnosis not present

## 2018-09-29 LAB — COMPREHENSIVE METABOLIC PANEL
ALBUMIN: 3.7 g/dL (ref 3.5–5.0)
ALT: 19 U/L (ref 0–44)
AST: 30 U/L (ref 15–41)
Alkaline Phosphatase: 80 U/L (ref 38–126)
Anion gap: 9 (ref 5–15)
BUN: 9 mg/dL (ref 6–20)
CHLORIDE: 106 mmol/L (ref 98–111)
CO2: 25 mmol/L (ref 22–32)
CREATININE: 0.8 mg/dL (ref 0.44–1.00)
Calcium: 8.6 mg/dL — ABNORMAL LOW (ref 8.9–10.3)
GFR calc non Af Amer: >60 mL/min (ref >60)
GLUCOSE: 95 mg/dL (ref 70–99)
Potassium: 3.5 mmol/L (ref 3.5–5.1)
SODIUM: 140 mmol/L (ref 135–145)
Total Bilirubin: 1.2 mg/dL (ref 0.3–1.2)
Total Protein: 6.1 g/dL — ABNORMAL LOW (ref 6.5–8.1)

## 2018-09-29 LAB — CBC
HCT: 33.6 % — ABNORMAL LOW (ref 36.0–46.0)
Hemoglobin: 11.3 g/dL — ABNORMAL LOW (ref 12.0–15.0)
MCH: 29.5 pg (ref 26.0–34.0)
MCHC: 33.6 g/dL (ref 30.0–36.0)
MCV: 87.7 fL (ref 78.0–100.0)
Platelets: 278 10*3/uL (ref 150–400)
RBC: 3.83 MIL/uL — AB (ref 3.87–5.11)
RDW: 13.1 % (ref 11.5–15.5)
WBC: 2 10*3/uL — AB (ref 4.0–10.5)

## 2018-09-29 LAB — GLUCOSE, CAPILLARY: GLUCOSE-CAPILLARY: 109 mg/dL — AB (ref 70–99)

## 2018-09-29 MED ORDER — ONDANSETRON HCL 4 MG PO TABS: 4.0000 mg | ORAL_TABLET | Freq: Four times a day (QID) | ORAL | 0 refills | Status: DC | PRN

## 2018-09-29 NOTE — Discharge Summary (Signed)
Physician Discharge Summary  Bethany Webb BJY:[id number] DOB: 05-08-1961 DOA: 09/27/2018  PCP: Carlena Hurl, PA-C  Admit date: 09/27/2018 Discharge date: 09/29/2018  Admitted From: Home Disposition: Home  Recommendations for Outpatient Follow-up:  1. Follow up with PCP in 1-2 days. 2. Please discontinue use of all CBD products (if you are using these) as these can actually induce vomiting although they are marketed to treat it..   Home Health: No Equipment/Devices: None  Discharge Condition: Improved CODE STATUS: Full code Diet recommendation: Heart Healthy /  Brief/Interim Summary: Bethany Webb is a 57 y.o. female with medical history significant of hypertension, hyperlipidemia, diabetes, sarcoidosis who presented from her PCP office after a near syncopal episode.  Patient states that she was going to her PCP for evaluation of pain and cramping in both of her arms and legs for 1 week.  In the waiting room, she became lightheaded and felt as sure if she was going to pass out.  She denies any loss of consciousness.  She was noted to have tachycardia with reported pulse of 182 blood pressure of 170/111 at the PCP office.  She had associated nausea with vomiting.  She was treated with IV Zofran and 500 mL IV fluid bolus by EMS and brought to the emergency department.  She had associated palpitations and abdominal discomfort during this episode.  She denies any seizure-like activity, focal weakness, chest pain, dyspnea, bowel/bladder dysfunction.  She reports poor appetite for the last few days.  She is placed in overnight observation and it appeared that her knees near syncope was due to her vomiting.  When she started back on her full dose of Ozempic her nausea and vomiting seem to have been triggered.  He is improved significantly now off the Ozempic and she is able to tolerate p.o.'s although she still requiring some dance of trauma.  We suspect that the Ozempic may have triggered her  symptoms.  If she does not improve or continue to improve over the next week or so she may require a gastric emptying scan.  Of asked her to follow-up with Dr. Glade Lloyd in the next 1 to 2 days.  Patient has reached maximal benefit of hospitalization.  Discharge diagnosis, prognosis, plans, follow-up, medications and treatments discussed with the patient(or responsible party) and is in agreement with the plans as described.  Patient is stable for discharge.  Discharge Diagnoses:  Principal Problem:   Near syncope Active Problems:   Diabetes mellitus with complication ([place])   Hyperlipidemia associated with type 2 diabetes mellitus ([place])   Hypertension associated with diabetes ([place])   Sarcoidosis   Hypokalemia    Discharge Instructions  Discharge Instructions    Diet - low sodium heart healthy   Complete by:  As directed    Discharge instructions   Complete by:  As directed    Discontinue use of all CBD products.  They can actually induce nausea and vomiting although they are marketed as helping it.   Increase activity slowly   Complete by:  As directed      Allergies as of 09/29/2018      Reactions   Fish-derived Products Anaphylaxis, Itching, Swelling   Throat swells and lips itch   Shellfish-derived Products Anaphylaxis, Itching, Swelling   Throat swells and lips itch   Codeine Nausea Only   Gabapentin Other (See Comments)   Per patient: "Unusual feeling"   Metformin And Related Diarrhea, Nausea Only      Medication List  STOP taking these medications   ibuprofen 800 MG tablet Commonly known as:  ADVIL,MOTRIN   NON FORMULARY   nystatin cream Commonly known as:  MYCOSTATIN   Semaglutide (1 MG/DOSE) 2 MG/1.5ML Sopn     TAKE these medications   amLODipine 10 MG tablet Commonly known as:  NORVASC TAKE 1 TABLET BY MOUTH EVERY DAY   azaTHIOprine 50 MG tablet Commonly known as:  IMURAN Take 50 mg by mouth daily.   BAYER CONTOUR TEST test strip Generic drug:   glucose blood TEST BLOOD SUGAR TWICE DAILY   glucose blood test strip Use as instructed   blood glucose meter kit and supplies Kit Dispense based on patient and insurance preference. Use up to four times daily as directed. (FOR ICD-9 250.00, 250.01).   cholecalciferol 1000 units tablet Commonly known as:  VITAMIN D Take 1 tablet (1,000 Units total) by mouth daily.   HUMALOG MIX 75/25 KWIKPEN (75-25) 100 UNIT/ML Kwikpen Generic drug:  Insulin Lispro Prot & Lispro ADMINISTER 50 UNITS UNDER THE SKIN THREE TIMES DAILY What changed:  See the new instructions.   Insulin Pen Needle 32G X 4 MM Misc INJECT THREE TIMES DAILY AS DIRECTED   Lancets 30G Misc Test blood sugars twice daily   lisinopril-hydrochlorothiazide 20-25 MG tablet Commonly known as:  PRINZIDE,ZESTORETIC Take 1 tablet by mouth daily.   metFORMIN 500 MG 24 hr tablet Commonly known as:  GLUCOPHAGE-XR Take 1 tablet (500 mg total) by mouth daily with breakfast. 2 tablets daily What changed:    when to take this  additional instructions   ondansetron 4 MG tablet Commonly known as:  ZOFRAN Take 1 tablet (4 mg total) by mouth every 6 (six) hours as needed for nausea.   PEPPERMINT OIL PO Take 1 drop by mouth See admin instructions. Place 1 drop into 12-16 ounces of water and drink as needed for nausea or headaches   pravastatin 40 MG tablet Commonly known as:  PRAVACHOL TAKE 1 TABLET(40 MG) BY MOUTH DAILY What changed:    how much to take  how to take this  when to take this  additional instructions      Follow-up Information    Tysinger, David S, PA-C. Schedule an appointment as soon as possible for a visit in 2 day(s).   Specialty:  Family Medicine Contact information: [address] [place] [address] [phone]          Allergies  Allergen Reactions  . Fish-Derived Products Anaphylaxis, Itching and Swelling    Throat swells and lips itch  . Shellfish-Derived Products Anaphylaxis,  Itching and Swelling    Throat swells and lips itch  . Codeine Nausea Only  . Gabapentin Other (See Comments)    Per patient: "Unusual feeling"  . Metformin And Related Diarrhea and Nausea Only        Procedures/Studies: Ct Angio Chest/abd/pel For Dissection W And/or W/wo  Result Date: 09/27/2018 CLINICAL DATA:  57-year-old female with acute chest, abdominal and pelvic pain and near syncope. History of sarcoid. EXAM: CT ANGIOGRAPHY CHEST, ABDOMEN AND PELVIS TECHNIQUE: Multidetector CT imaging through the chest, abdomen and pelvis was performed using the standard protocol during bolus administration of intravenous contrast. Multiplanar reconstructed images and MIPs were obtained and reviewed to evaluate the vascular anatomy. CONTRAST:  100mL ISOVUE-370 IOPAMIDOL (ISOVUE-370) INJECTION 76% COMPARISON:  08/24/2016 chest CT, 01/21/2016 PET CT and 12/17/2015 abdomen/pelvic CT. FINDINGS: CTA CHEST FINDINGS Cardiovascular: Heart size normal. There is no evidence of thoracic aortic aneurysm or dissection.   No pericardial effusion. No large or central pulmonary emboli identified. Mediastinum/Nodes: No enlarged mediastinal, hilar, or axillary lymph nodes. Thyroid gland, trachea, and esophagus demonstrate no significant findings. Lungs/Pleura: No airspace disease, consolidation, suspicious nodule, mass, pleural effusion or pneumothorax. Bilateral pulmonary nodules are noted, stable to decreased in size from 2017. Calcified pleural thickening on the LEFT is again noted. Musculoskeletal: No acute or suspicious bony abnormalities. Review of the MIP images confirms the above findings. CTA ABDOMEN AND PELVIS FINDINGS VASCULAR Aorta: Normal caliber aorta without aneurysm, dissection, vasculitis or significant stenosis. Celiac: Patent without evidence of aneurysm, dissection, vasculitis or significant stenosis. SMA: Patent without evidence of aneurysm, dissection, vasculitis or significant stenosis. Renals: Both renal  arteries are patent without evidence of aneurysm, dissection, vasculitis, fibromuscular dysplasia or significant stenosis. IMA: Patent without evidence of aneurysm, dissection, vasculitis or significant stenosis. Inflow: Patent without evidence of aneurysm, dissection, vasculitis or significant stenosis. Veins: No obvious venous abnormality within the limitations of this arterial phase study. Review of the MIP images confirms the above findings. NON-VASCULAR Hepatobiliary: The liver and gallbladder are unremarkable. No biliary dilatation. Pancreas: Unremarkable Spleen: Unremarkable Adrenals/Urinary Tract: The kidneys, adrenal glands and bladder are unremarkable. Stomach/Bowel: Stomach is within normal limits. No evidence of bowel wall thickening, distention, or inflammatory changes. Lymphatic: No enlarged lymph nodes. Reproductive: Status post hysterectomy. No adnexal masses. Other: No ascites, focal collection or pneumoperitoneum. Musculoskeletal: No acute or suspicious bony abnormalities. Review of the MIP images confirms the above findings. IMPRESSION: 1. No evidence of acute abnormality. No aortic aneurysm or dissection. 2. No other significant abnormalities. Electronically Signed   By: Jeffrey  Hu M.D.   On: 09/27/2018 20:07       Subjective: Feeling better able to tolerate p.o. with the use of ondansetron   Discharge Exam: Vitals:   09/28/18 2345 09/29/18 0554  BP: 104/60 116/60  Pulse: 94 81  Resp: 16 16  Temp: 98 F (36.7 C) 98.5 F (36.9 C)  SpO2: 98% 99%   Vitals:   09/28/18 1426 09/28/18 1716 09/28/18 2345 09/29/18 0554  BP: 116/64 135/69 104/60 116/60  Pulse: 98 95 94 81  Resp: 17 18 16 16  Temp: 98 F (36.7 C) 98.6 F (37 C) 98 F (36.7 C) 98.5 F (36.9 C)  TempSrc: Oral Oral Oral Oral  SpO2: 100% 100% 98% 99%  Weight:      Height:        General: Pt is alert, awake, not in acute distress Cardiovascular: RRR, S1/S2 +, no rubs, no gallops Respiratory: CTA  bilaterally, no wheezing, no rhonchi Abdominal: Soft, NT, ND, bowel sounds + Extremities: no edema, no cyanosis    The results of significant diagnostics from this hospitalization (including imaging, microbiology, ancillary and laboratory) are listed below for reference.     Microbiology: No results found for this or any previous visit (from the past 240 hour(s)).   Labs: BNP (last 3 results) No results for input(s): BNP in the last 8760 hours. Basic Metabolic Panel: Recent Labs  Lab 09/27/18 1745 09/28/18 0734 09/28/18 1506 09/29/18 0508  NA 139 137 138 140  K 3.0* 3.2* 3.6 3.5  CL 100 102 105 106  CO2 22 26 23 25  GLUCOSE 103* 93 98 95  BUN 14 11 9 9  CREATININE 0.85 0.93 0.80 0.80  CALCIUM 9.9 9.3 8.8* 8.6*  MG 1.7  --   --   --    Liver Function Tests: Recent Labs  Lab 09/29/18 0508  AST 30    ALT 19  ALKPHOS 80  BILITOT 1.2  PROT 6.1*  ALBUMIN 3.7   Recent Labs  Lab 09/28/18 1506  LIPASE 88*   No results for input(s): AMMONIA in the last 168 hours. CBC: Recent Labs  Lab 09/27/18 1745 09/29/18 0508  WBC 3.8* 2.0*  NEUTROABS 2.1  --   HGB 12.3 11.3*  HCT 36.9 33.6*  MCV 89.1 87.7  PLT 342 278   Cardiac Enzymes: No results for input(s): CKTOTAL, CKMB, CKMBINDEX, TROPONINI in the last 168 hours. BNP: Invalid input(s): POCBNP CBG: Recent Labs  Lab 09/28/18 0718 09/28/18 1102 09/28/18 1710 09/28/18 2138 09/29/18 0821  GLUCAP 82 84 101* 139* 109*   D-Dimer No results for input(s): DDIMER in the last 72 hours. Hgb A1c No results for input(s): HGBA1C in the last 72 hours. Lipid Profile No results for input(s): CHOL, HDL, LDLCALC, TRIG, CHOLHDL, LDLDIRECT in the last 72 hours. Thyroid function studies No results for input(s): TSH, T4TOTAL, T3FREE, THYROIDAB in the last 72 hours.  Invalid input(s): FREET3 Anemia work up No results for input(s): VITAMINB12, FOLATE, FERRITIN, TIBC, IRON, RETICCTPCT in the last 72 hours. Urinalysis     Component Value Date/Time   COLORURINE YELLOW 10/31/2017 1641   APPEARANCEUR CLEAR 10/31/2017 1641   LABSPEC 1.023 10/31/2017 1641   LABSPEC 1.030 07/21/2017 1131   PHURINE 6.0 10/31/2017 1641   GLUCOSEU NEGATIVE 10/31/2017 1641   GLUCOSEU 250 (A) 08/11/2016 1412   HGBUR NEGATIVE 10/31/2017 1641   BILIRUBINUR negative 06/04/2018 1141   BILIRUBINUR neg 01/23/2017 0833   KETONESUR negative 06/04/2018 1141   KETONESUR 5 (A) 10/31/2017 1641   PROTEINUR negative 06/04/2018 1141   PROTEINUR NEGATIVE 10/31/2017 1641   UROBILINOGEN negative 01/23/2017 0833   UROBILINOGEN 1.0 08/11/2016 1412   NITRITE Negative 06/04/2018 1141   NITRITE NEGATIVE 10/31/2017 1641   LEUKOCYTESUR Negative 06/04/2018 1141   Sepsis Labs Invalid input(s): PROCALCITONIN,  WBC,  LACTICIDVEN Microbiology No results found for this or any previous visit (from the past 240 hour(s)).   Time coordinating discharge: 45 minutes  SIGNED:    C , MD  FACP [facility] 09/29/2018, 11:54 AM Pager   If 7PM-7AM, please contact night-coverage [email or web address] Password [email or web address] 

## 2018-09-29 NOTE — Plan of Care (Signed)
Pt alert and oriented x4. Skin warm and dry. Respirations equal and unlabored. Pt verbalized understanding plan of care. Pt able to manage health-related needs.  PT symptoms improving. Pt able to tolerate foods w/ medication intervention. Diagnostic tests improving. Pt able to remain free from injury during stay. Pt ambulatory with steady gait. Denies dizziness when standing.

## 2018-09-29 NOTE — Progress Notes (Signed)
D/c reviewed with patient. No further questions at this time 

## 2018-10-01 ENCOUNTER — Ambulatory Visit: Payer: 59 | Admitting: Medical

## 2018-10-01 VITALS — BP 126/70 | HR 86 | Temp 98.1°F | Resp 16 | Ht 64.0 in | Wt 158.4 lb

## 2018-10-01 DIAGNOSIS — E118 Type 2 diabetes mellitus with unspecified complications: Secondary | ICD-10-CM | POA: Diagnosis not present

## 2018-10-01 DIAGNOSIS — I10 Essential (primary) hypertension: Secondary | ICD-10-CM

## 2018-10-01 DIAGNOSIS — R52 Pain, unspecified: Secondary | ICD-10-CM

## 2018-10-01 DIAGNOSIS — D869 Sarcoidosis, unspecified: Secondary | ICD-10-CM

## 2018-10-01 DIAGNOSIS — E1159 Type 2 diabetes mellitus with other circulatory complications: Secondary | ICD-10-CM

## 2018-10-01 DIAGNOSIS — G5600 Carpal tunnel syndrome, unspecified upper limb: Secondary | ICD-10-CM

## 2018-10-01 DIAGNOSIS — R000 Tachycardia, unspecified: Secondary | ICD-10-CM

## 2018-10-01 DIAGNOSIS — I152 Hypertension secondary to endocrine disorders: Secondary | ICD-10-CM

## 2018-10-01 DIAGNOSIS — M533 Sacrococcygeal disorders, not elsewhere classified: Secondary | ICD-10-CM | POA: Insufficient documentation

## 2018-10-01 DIAGNOSIS — R112 Nausea with vomiting, unspecified: Secondary | ICD-10-CM | POA: Diagnosis not present

## 2018-10-01 DIAGNOSIS — E1169 Type 2 diabetes mellitus with other specified complication: Secondary | ICD-10-CM

## 2018-10-01 DIAGNOSIS — M79606 Pain in leg, unspecified: Secondary | ICD-10-CM

## 2018-10-01 DIAGNOSIS — E785 Hyperlipidemia, unspecified: Secondary | ICD-10-CM

## 2018-10-01 NOTE — Progress Notes (Signed)
Subjective: Chief Complaint  Patient presents with  . hospital follow up    hospital follow up   Here for hospital follow-up.  Was admitted 09/27/2018 through 09/29/2018.  We sent her to the hospital last week when she should appear acutely with weakness, near syncope, nausea and vomiting uncontrollable, blood pressure and pulse increased with pulse over 180.  She has a medical history significant for hypertension, hyperlipidemia, diabetes, sarcoidosis of multiple areas and associated pain.  Prior to her visit last week when she was sent to the emergency department, she had been having significant pain throughout her body, poor appetite last few days prior to the visit.  She had also been using CBD oil both oral and topical.  She was advised to stop CBD oil and Ozempic with this hospitalization.    She notes since hospital discharge she still feels fatigue, weakness, and diffuse pain in bilat legs.  She denies numbness or tingling in the legs, no swelling, no discoloration.  She denies saddle anesthesia, no incontinence, does not specifically report back pain.  No abdominal pain  She denies any abuse by anyone no physical harm or worry of physical harm or verbal harm.  She lives alone.  She does not have any family close by.   Diabetes- she continues to take metformin XR 500 mg, 2 tablets daily.  In the past she did not tolerate generic metformin due to nausea and diarrhea.  She has been on Ozempic for the past year however she stopped this on her own over a month ago as her blood sugars are running in the 80s fasting.  When she saw her sarcoid specialist about a month ago they advised that she should not have stopped the medication without consulting with [name].  She promptly started back at the regular dose without titrating up.  It was felt at the hospitalization that this was a contributing factor to her nausea although she notes the nausea started the day she came in here and not weeks ago.  She  reports that she has been tolerating Ozempic in the past several months without complaint or nausea.   For the past several months she has been using insulin 70/30 mix initially with NovoLog but insurance would not cover this.  She was changed to Humalog 75/25, but she reports recently if her blood sugars average less than 150 she will not take the insulin at all.  But when she does use the insulin she will use on average 10 to 20 units twice daily.  She could not give a specific guideline a parameter on when she would use certain amount of units.  Pain- it was thought in last year that some of her pains were related to sarcoidosis although I don't have records from her specialist in [place].  Her very first symptom related to sarcoidosis was lower extremity paresthesias and pain.  Although we prescribed gabapentin months ago she at some point quit this on her own in recent months.  She says she did like the way it made her feel.  She notes that a PET scan was done earlier in the year, there was mention of arthritis in lumbar spine.    Hypertension-compliant with amlodipine 10 mg daily, lisinopril HCT 20/25 mg daily.  Hyperlipidemia-compliant with Pravachol 40 mg daily  Sarcoidosis-managed by specialist in [place], [place].  She is taking Azathioprine 50 mg daily.   Sees them q6 months.     Past Medical History:  Diagnosis Date  .  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 Biceps tendonitis 2/12   Dr. Gladstone Lighter  . History of blood transfusion 1988   "related to hysterectomy" (09/27/2018)  . History of mammogram 05/25/12   stable, no suspicious finding, extremely dense breast tissue  . Hyperlipidemia   . Hypertension   . Neutropenia, unspecified ([place]) 02/2013   slight, likely ethnicity related  . Sarcoidosis    "lung, liver, eyes, skin" (09/27/2018)  . Seasonal allergies   . Shortness of breath dyspnea    with exertion    . Supraspinatus tendonitis 2/12   Dr. Gladstone Lighter  . Type II diabetes mellitus ([place])   . Wears glasses    Current Outpatient Medications on File Prior to Visit  Medication Sig Dispense Refill  . amLODipine (NORVASC) 10 MG tablet TAKE 1 TABLET BY MOUTH EVERY DAY (Patient taking differently: Take 10 mg by mouth daily. ) 90 tablet 0  . azaTHIOprine (IMURAN) 50 MG tablet Take 50 mg by mouth daily.    Marland Kitchen BAYER CONTOUR TEST test strip TEST BLOOD SUGAR TWICE DAILY 100 each 0  . blood glucose meter kit and supplies KIT Dispense based on patient and insurance preference. Use up to four times daily as directed. (FOR ICD-9 250.00, 250.01). 1 each 0  . glucose blood (ONE TOUCH ULTRA TEST) test strip Use as instructed 100 each 12  . HUMALOG MIX 75/25 KWIKPEN (75-25) 100 UNIT/ML Kwikpen ADMINISTER 50 UNITS UNDER THE SKIN THREE TIMES DAILY (Patient taking differently: Inject 10-12 Units into the skin See admin instructions. Inject 10-12 units into the skin 1-2 times a day before meals if BGL is 150-200 or greater) 15 mL 3  . Insulin Pen Needle (BD PEN NEEDLE NANO U/F) 32G X 4 MM MISC INJECT THREE TIMES DAILY AS DIRECTED 100 each 11  . Lancets 30G MISC Test blood sugars twice daily 100 each 2  . lisinopril-hydrochlorothiazide (PRINZIDE,ZESTORETIC) 20-25 MG tablet Take 1 tablet by mouth daily. 90 tablet 3  . metFORMIN (GLUCOPHAGE-XR) 500 MG 24 hr tablet Take 1 tablet (500 mg total) by mouth daily with breakfast. 2 tablets daily (Patient taking differently: Take 500 mg by mouth 2 (two) times daily. ) 180 tablet 0  . ondansetron (ZOFRAN) 4 MG tablet Take 1 tablet (4 mg total) by mouth every 6 (six) hours as needed for nausea. 20 tablet 0  . PEPPERMINT OIL PO Take 1 drop by mouth See admin instructions. Place 1 drop into 12-16 ounces of water and drink as needed for nausea or headaches    . pravastatin (PRAVACHOL) 40 MG tablet TAKE 1 TABLET(40 MG) BY MOUTH DAILY (Patient taking differently: Take 40 mg by mouth daily. )  90 tablet 3  . cholecalciferol (VITAMIN D) 1000 units tablet Take 1 tablet (1,000 Units total) by mouth daily. (Patient not taking: Reported on 09/27/2018) 90 tablet 3   No current facility-administered medications on file prior to visit.    ROS as in subjective    Objective: BP 126/70   Pulse 86   Temp 98.1 F (36.7 C) (Oral)   Resp 16   Ht '5\' 4"'  (1.626 m)   Wt 158 lb 6.4 oz (71.8 kg)   LMP 12/27/1987   SpO2 98%   BMI 27.19 kg/m   Wt Readings from Last 3 Encounters:  10/01/18 158 lb 6.4 oz (71.8 kg)  09/27/18 165 lb (74.8 kg)  09/04/18 168 lb (76.2 kg)  General appearance: alert, no distress, WD/WN, AA female Psych: She seems somewhat down today, and pain, lying on the exam table initially HEENT: normocephalic, sclerae anicteric, TMs pearly, nares patent, no discharge or erythema, pharynx normal Oral cavity: MMM, no lesions Neck: supple, no lymphadenopathy, no thyromegaly, no masses Heart: RRR, normal S1, S2, no murmurs Lungs: CTA bilaterally, no wheezes, rhonchi, or rales Abdomen: +bs, soft, non tender, non distended, no masses, no hepatomegaly, no splenomegaly Legs nontender, no swelling, no deformity, seemingly normal range of motion No extremity edema Pulses: 2+ symmetric, upper and lower extremities, normal cap refill     Assessment: Encounter Diagnoses  Name Primary?  . Diabetes mellitus with complication ([place]) Yes  . Hypertension associated with diabetes ([place])   . Nausea and vomiting, intractability of vomiting not specified, unspecified vomiting type   . Diffuse pain   . Lower extremity pain, diffuse, unspecified laterality   . Sarcoidosis   . Hyperlipidemia associated with type 2 diabetes mellitus ([place])   . Tachycardia   . Carpal tunnel syndrome, unspecified laterality      Plan: I reviewed the recent records from her hospital discharge summary from last week, reviewed labs done at the hospital, CT angio chest at the hospital.   I reviewed her  recent lumbar spine x-ray from June.  Lumbar spine xray  06/08/18 -   Mild scoliosis. Facet osteoarthritic change at L5-S1 bilaterally. No appreciable disc space narrowing. No fracture or spondylolisthesis.  Sarcoidisos - I reviewed her office note from 09/10/2018 with sarcoidosis specialist, Dr. Theotis Barrio She has sarcoidosis involving lung with bilateral hilar adenopathy on chest x-ray and liver based on liver biopsy, thoracic lymphadenopathy based on ET LN biopsy.  She continues on azathioprine 50 mg daily.  There was talk of possible referral to [facility] pulmonary rehab.  She was recommended to have annual ophthalmology screening, bone density scan every 2 years if on chronic steroid therapy, she is had a recent blood work surveillance with sarcoid clinic and cardiac screening suggest no cardiac involvement per sarcoidosis specialist.  Leg pain, diffuse pain in her legs, lower extremity pain- the cause of her pain is not completely clear.  At one point in the past year it was thought that her pain is more related to sarcoidosis, but not clear.  She doesn't have typical neuropathy pain.   She has been on prednisone long-term month-to-month regarding sarcoid her pain has been mostly gone.  Xray above from 06/08/18 not all that remarkable.   Consider consult with neurology or discuss further with sarcoid clinic.  Her tachycardia and elevated blood pressures have normalized, all seem to be likely induced by acute pain, nausea and vomiting, and a fasting state, exacerbated by medication side effects with Ozempic and possibly metformin  Hypertension-continue current medication  Hyperlipidemia-on statin, but given the diffuse pains, consider holding this given the possibility of statin induced myopathy.   Diabetes- prior to her sarcoidosis diagnosis January 2017, she had been tolerating glipizide and Xigduo, but we stopped Pravachol and Xigduo at that time.  We changed to metformin and glipizide.  We  worked with her for a while until prednisone was added ongoing for the sarcoidosis.  I ended up referring her to endocrinology in August 2017.  At the time of that consult she was taking NovoLog Mix 30 units in the morning 45 units at lunch and dinner.  On that first endocrinology visit discharge instructions were NovoLog Mix 35 units before breakfast, 55 units before lunch time  and 40 units at suppertime.  She was started back on Xigduo at that time.  From the endocrinology notes it appears she was also given NovoLog fast acting insulin 20 units before lunch and 15 units before supper as well.    he had had a few follow up visits with endocrinology over the next several months.  At her last visit in February 2018 she was transitioned to a V-go device 20 units basal and 6 clicks each meal.  At some point between 2018 and now she is come off of prednisone at various times and sugars have improved but she was not coming in specifically every 3 months for follow-up.  At this point she has been modifying some of her medications on her own.  Given his recent hospitalization with nausea vomiting and having done some self adjustments, I think it is time to start back to the basics but remove any potential offending medication at this point given the pain and the fact that she was hospitalized for tachycardia nausea and vomiting.    For now we will have her just take metformin XR 500 mg 1 tablet daily in the event metformin is aggravating her nausea and vomiting.  I will have her stop Ozempic for now since she has had some significant nausea recently.   Will call back with other recommendations.  Carpal tunnel syndrome-seen recently by orthopedics in September, nerve conduction studies ordered, advised to continue carpal tunnel brace.  interesting when she saw Dr. Erlinda Hong 09/04/2018 for CTS, his notes mentions left CTS. I observed that she had a carpal tunnel splint applied to her right wrist today when she was in the  office.  I reviewed these notes after she had already left for the day.   Addendum: We will call patient about the following recommendations  Diabetes: 1- change to Metformin 1 tablet daily 2-use Humalog mix 75/25, 5 units TID.  If morning glucose <100, then use 2.5units TID 3- check sugars each morning and bring these in next visit 4-the diffuse pain is still unclear.  Possibly related to sarcoid, possibly fibromyalgia.  medications can possible be contributing, thus lets stop Pravachol for now and see if this helps the pain. 5-have her use OTC Tylenol 368m twice daily for a week, and see if this helps 6 - continue rest of medication as usual  F/u in 3-4 weeks with sugar readings.   DEstherwas seen today for hospital follow up.  Diagnoses and all orders for this visit:  Diabetes mellitus with complication ([place]  Hypertension associated with diabetes ([place]  Nausea and vomiting, intractability of vomiting not specified, unspecified vomiting type  Diffuse pain  Lower extremity pain, diffuse, unspecified laterality  Sarcoidosis  Hyperlipidemia associated with type 2 diabetes mellitus ([facility])  Tachycardia  Carpal tunnel syndrome, unspecified laterality

## 2018-10-02 ENCOUNTER — Telehealth: Payer: Self-pay | Admitting: Medical

## 2018-10-02 NOTE — Telephone Encounter (Signed)
Please call patient.   Per Monday's appt, lets do the following: Diabetes: 1- change to Metformin 1 tablet daily 2-use Humalog mix 75/25, 5 units TID.  If morning glucose <100, then use 2.5units TID 3- check sugars each morning and bring these in next visit 4-the diffuse pain is still unclear.  Possibly related to sarcoid, possibly fibromyalgia.  medications can possible be contributing, thus lets stop Pravachol for now and see if this helps the pain. 5-have her use OTC Tylenol 325mg  twice daily for a week, and see if this helps 6 - continue rest of medication as usual  F/u in 3-4 weeks with sugar readings.

## 2018-10-02 NOTE — Telephone Encounter (Signed)
See other message below.   STOP Ozempic for now, see if she needs any refills.

## 2018-10-03 NOTE — Telephone Encounter (Signed)
Pt made aware of changes and made an appt 10-24-18 to follow up. Pt was advised to call if she needed anything. [place]

## 2018-10-05 ENCOUNTER — Telehealth: Payer: Self-pay | Admitting: Medical

## 2018-10-05 NOTE — Telephone Encounter (Signed)
Pt came in and dropped off FMLA forms to be completed. Sending back to Cindi per office protocol. Please call pt at [phone] when ready.

## 2018-10-08 NOTE — Telephone Encounter (Signed)
done

## 2018-10-08 NOTE — Telephone Encounter (Signed)
Copied form and called pt to let her know it is ready for pick up

## 2018-10-23 ENCOUNTER — Other Ambulatory Visit: Payer: Self-pay | Admitting: Medical

## 2018-10-24 ENCOUNTER — Ambulatory Visit: Payer: 59 | Admitting: Medical

## 2018-10-24 ENCOUNTER — Telehealth: Payer: Self-pay | Admitting: Medical

## 2018-10-24 ENCOUNTER — Encounter: Payer: Self-pay | Admitting: Medical

## 2018-10-24 VITALS — BP 120/76 | HR 86 | Temp 98.5°F | Resp 16 | Ht 64.0 in | Wt 160.2 lb

## 2018-10-24 DIAGNOSIS — R110 Nausea: Secondary | ICD-10-CM | POA: Diagnosis not present

## 2018-10-24 DIAGNOSIS — M79604 Pain in right leg: Secondary | ICD-10-CM | POA: Diagnosis not present

## 2018-10-24 DIAGNOSIS — M79605 Pain in left leg: Secondary | ICD-10-CM

## 2018-10-24 DIAGNOSIS — I152 Hypertension secondary to endocrine disorders: Secondary | ICD-10-CM

## 2018-10-24 DIAGNOSIS — G5600 Carpal tunnel syndrome, unspecified upper limb: Secondary | ICD-10-CM

## 2018-10-24 DIAGNOSIS — M545 Low back pain, unspecified: Secondary | ICD-10-CM

## 2018-10-24 DIAGNOSIS — I10 Essential (primary) hypertension: Secondary | ICD-10-CM

## 2018-10-24 DIAGNOSIS — D869 Sarcoidosis, unspecified: Secondary | ICD-10-CM

## 2018-10-24 DIAGNOSIS — E1159 Type 2 diabetes mellitus with other circulatory complications: Secondary | ICD-10-CM

## 2018-10-24 DIAGNOSIS — E118 Type 2 diabetes mellitus with unspecified complications: Secondary | ICD-10-CM

## 2018-10-24 MED ORDER — INSULIN LISPRO PROT & LISPRO (75-25 MIX) 100 UNIT/ML KWIKPEN: 5.0000 [IU] | PEN_INJECTOR | Freq: Three times a day (TID) | SUBCUTANEOUS | 3 refills | Status: DC

## 2018-10-24 MED ORDER — AMLODIPINE BESYLATE 10 MG PO TABS: 10.0000 mg | ORAL_TABLET | Freq: Every day | ORAL | 3 refills | Status: DC

## 2018-10-24 MED ORDER — METFORMIN HCL ER 500 MG PO TB24: 500.0000 mg | ORAL_TABLET | Freq: Every day | ORAL | 2 refills | Status: DC

## 2018-10-24 NOTE — Telephone Encounter (Signed)
Please call pharmacy: Our office apparently got a refill request yesterday for NovoLog 70/30 mix.  We apparently approved the refill.  I never saw the refill request so I assume this was refilled by 1 of the CMA's.  I can only assume that this was an auto refill request from the pharmacy.  The problem is she is taking Humalog 75/25, not NovoLog.  She there are several issues we need to think about here  First of all, please call the pharmacy and ask them to discontinue the NovoLog 70/30 as it should have already been discontinued.  I am not sure why they are continuing to send this refill request when she is currently taking Humalog 75/25.  So they need to help [name] on their end to figure out how to prevent this sort of miscommunication in the future   Secondly, please always make sure that during triage and check in, we do not just simply ask "has anything changed from the last time" in terms of medication log, but we need to actually ask how many units of the insulin are you taking, once daily, twice daily 3 times a day?  We also need to be on the watch for duplications or something that does not look right.  Because if I base a medication change today on incorrectly listed medication log, then this can have bad outcomes.  Today she had both Humalog 75/25 and Novolog 70/30 listed as "active"  Finally with our current nurse medication refill protocols, insulins should probably be directed to the provider to refill because things may have changed from the last visit such as in this case.  I am not sure why the pharmacy sent NovoLog refill as the last time this was refilled by [name] was last year.   We have since changed to Humalog

## 2018-10-24 NOTE — Progress Notes (Signed)
Subjective: Chief Complaint  Patient presents with  . follow up    follow up dm    Here for f/u.  At her last visit October 7, we made a few changes with her medications.  She reports compliance with those changes.  She is using Humalog 75/25 mix insulin 5 units 3 times a day at 9 AM, 2 PM and 7 PM.  She is using the Metformin XR 500 mg once daily.  She stopped Ozempic per hospital recommendations when she was hospitalized prior to last visit for significant nausea and chest pain..  She has not had any more nausea since stopping Ozempic.  She also stop Pravachol since last visit due to pains.  Since last visit her pains have significantly improved in the legs and back.  She has ongoing arm pain and carpal tunnel syndrome, sees orthopedics for this.  We had her stop Pravachol short-term to see if any of the myalgias were related to the statin.  She has her glucose readings with her today.  Prior to this past weekend her numbers were ranging fasting 96 to mid 100s but some of to 150 or 160s.  However over the weekend she went to college homecoming and ate some donuts and other sweets and her sugar readings were up in the 200s and even in the 300s this weekend.  She sees her sarcoid doctor again in January.  She was a little confused on medicine yesterday as NovoLog got sent to the pharmacy although she is currently on Humalog mix 75/25.   Past Medical History:  Diagnosis Date  . 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 Biceps tendonitis 2/12   Dr. Gladstone Lighter  . History of blood transfusion 1988   "related to hysterectomy" (09/27/2018)  . History of mammogram 05/25/12   stable, no suspicious finding, extremely dense breast tissue  . Hyperlipidemia   . Hypertension   . Neutropenia, unspecified ([place]) 02/2013   slight, likely ethnicity related  . Sarcoidosis    "lung, liver, eyes, skin" (09/27/2018)  . Seasonal  allergies   . Shortness of breath dyspnea    with exertion   . Supraspinatus tendonitis 2/12   Dr. Gladstone Lighter  . Type II diabetes mellitus ([place])   . Wears glasses     ROS as in subjective    Objective: BP 120/76   Pulse 86   Temp 98.5 F (36.9 C) (Oral)   Resp 16   Ht 5\' 4"  (1.626 m)   Wt 160 lb 3.2 oz (72.7 kg)   LMP 12/27/1987   SpO2 98%   BMI 27.50 kg/m    BP 120/76   Pulse 86   Temp 98.5 F (36.9 C) (Oral)   Resp 16   Ht 5\' 4"  (1.626 m)   Wt 160 lb 3.2 oz (72.7 kg)   LMP 12/27/1987   SpO2 98%   BMI 27.50 kg/m   Wt Readings from Last 3 Encounters:  10/24/18 160 lb 3.2 oz (72.7 kg)  10/01/18 158 lb 6.4 oz (71.8 kg)  09/27/18 165 lb (74.8 kg)   Gen: wd, wn, nad Psych: pleasant, doesn't appear to be in pain today Answers questions appropriately     Assessment: Encounter Diagnoses  Name Primary?  . Diabetes mellitus with complication ([place]) Yes  . Nausea   . Acute low back pain without sciatica, unspecified back pain laterality   . Pain in both lower  extremities   . Carpal tunnel syndrome, unspecified laterality   . Sarcoidosis   . Hypertension associated with diabetes ([place])      Plan: Diabetes- we will call the pharmacy to make sure they have discontinued NovoLog 70/30.  Continue Humalog 75/25, 5 units 3 times a day with meals.  Continue metformin once daily as she is tolerating this.  Counseled on limiting sweets and high sugar foods as evidenced by her high readings over the weekend.  Continue glucose monitoring.  Plan to recheck hemoglobin A1c in 3 months  Hypertension-continue current medications amlodipine and lisinopril HCT  Nausea-resolved since last visit after stopping Ozempic  Back and leg pain- much improved since last visit.  Unclear exactly what is causing the pain.  Could be multifactorial.  Either way she does note improvement in pain level since stopping Pravachol.  We discussed the risk and benefits of statin medications, standard of  care for diabetes.  For now we will continue to hold off on statin for another few weeks to make sure she continues to have limited pain into the severe pain she was having.  She will also speak to her sarcoidosis doctor about the pain.  We will plan to consider restarting a different statin in a few weeks.  I advised she call back within 2 weeks to give me an update on the pain and update on her conversation with her sarcoid doctor.  Consider monitoring CK levels over the next few visits.  Lumbar spine xray  06/08/18 -   Mild scoliosis. Facet osteoarthritic change at L5-S1 bilaterally. No appreciable disc space narrowing. No fracture or spondylolisthesis.  Carpal tunnel syndrome-improved of late, but follow-up with orthopedics  Sarcoidosis-managed by sarcoid specialist out of town    Bethany Webb was seen today for follow up.  Diagnoses and all orders for this visit:  Diabetes mellitus with complication ([facility])  Nausea  Acute low back pain without sciatica, unspecified back pain laterality  Pain in both lower extremities  Carpal tunnel syndrome, unspecified laterality  Sarcoidosis  Hypertension associated with diabetes ([place])  Other orders -     amLODipine (NORVASC) 10 MG tablet; Take 1 tablet (10 mg total) by mouth daily. -     Insulin Lispro Prot & Lispro (HUMALOG MIX 75/25 KWIKPEN) (75-25) 100 UNIT/ML Kwikpen; Inject 5 Units into the skin 3 (three) times daily. -     metFORMIN (GLUCOPHAGE-XR) 500 MG 24 hr tablet; Take 1 tablet (500 mg total) by mouth daily with breakfast.

## 2018-10-26 ENCOUNTER — Encounter (HOSPITAL_COMMUNITY): Payer: Self-pay

## 2018-10-26 ENCOUNTER — Encounter (HOSPITAL_COMMUNITY)
Admission: RE | Admit: 2018-10-26 | Discharge: 2018-10-26 | Disposition: A | Discharge status: Home / Self Care | Payer: 59 | Source: Ambulatory Visit | Attending: Pulmonary Disease | Admitting: Pulmonary Disease

## 2018-10-26 VITALS — BP 129/73 | HR 88 | Temp 98.0°F | Resp 18 | Ht 63.5 in | Wt 159.0 lb

## 2018-10-26 DIAGNOSIS — E785 Hyperlipidemia, unspecified: Secondary | ICD-10-CM | POA: Insufficient documentation

## 2018-10-26 DIAGNOSIS — E119 Type 2 diabetes mellitus without complications: Secondary | ICD-10-CM | POA: Diagnosis not present

## 2018-10-26 DIAGNOSIS — D869 Sarcoidosis, unspecified: Secondary | ICD-10-CM | POA: Diagnosis not present

## 2018-10-26 DIAGNOSIS — I10 Essential (primary) hypertension: Secondary | ICD-10-CM | POA: Insufficient documentation

## 2018-10-26 DIAGNOSIS — Z794 Long term (current) use of insulin: Secondary | ICD-10-CM | POA: Insufficient documentation

## 2018-10-26 DIAGNOSIS — Z79899 Other long term (current) drug therapy: Secondary | ICD-10-CM | POA: Insufficient documentation

## 2018-10-26 NOTE — Telephone Encounter (Signed)
done

## 2018-10-26 NOTE — Progress Notes (Signed)
Bethany Webb 57 y.o. female Pulmonary Rehab Orientation Note [place] referred by Dr. Jeneen Rinks at [facility] with sarcoidosis  arrived today in Cardiac and Pulmonary Rehab for orientation to Pulmonary Rehab.  She does not carry portable oxygen. Per pt, she uses oxygen never. Color good, skin warm and dry. Patient is oriented to time and place. Pt is well versed in her knowledge of her health history and her disease process. Patient's medical history, psychosocial health, and medications reviewed. Psychosocial assessment reveals pt lives alone. Pt is currently full time job doing work with the [employer] in the [employer] area. Pt hobbies include reading and spending time with her girlfreinds. Pt reports her stress level is high. Areas of stress/anxiety include Work.  Pt supervisor is difficult to get along with.  Pt is planning to retire next year. Pt does not exhibit signs of depression. PHQ2/9 score 2/4. Pt shows good  coping skills with positive outlook .  Will continue to monitor and evaluate progress toward psychosocial goal(s) of develop positive and healthy coping skills for work stressors. Physical assessment reveals heart rate is normal, breath sounds diminished. Grip strength equal, strong. Distal pulses palpable with trace edema on right ankle. Patient reports she does take medications as prescribed. Patient states she follows a Regular diet. The patient reports no specific efforts to gain or lose weight. However pt would like to lose weight but this has been difficult due to steroid therapy for her sacrcoidosis. Patient's weight will be monitored closely. Demonstration and practice of PLB using pulse oximeter. Patient able to return demonstration satisfactorily. Safety and hand hygiene in the exercise area reviewed with patient. Patient voices understanding of the information reviewed. Department expectations discussed with patient and achievable goals were set. The patient shows enthusiasm about  attending the program and we look forward to working with this nice ladyThe patient is scheduled for a 6 min walk test on 11/7 and to begin exercise on 11/14 at 10:30 am.  45 minutes was spent on a variety of activities such as assessment of the patient, obtaining baseline data including height, weight, BMI, and grip strength, verifying medical history, allergies, and current medications, and teaching patient strategies for performing tasks with less respiratory effort with emphasis on pursed lip breathing. [id number] Maurice Small RN, BSN Cardiac and Pulmonary Rehab Nurse Navigator

## 2018-11-01 ENCOUNTER — Encounter (HOSPITAL_COMMUNITY)
Admission: RE | Admit: 2018-11-01 | Discharge: 2018-11-01 | Disposition: A | Discharge status: Home / Self Care | Payer: 59 | Source: Ambulatory Visit | Attending: Pulmonary Disease | Admitting: Pulmonary Disease

## 2018-11-01 DIAGNOSIS — Z794 Long term (current) use of insulin: Secondary | ICD-10-CM | POA: Diagnosis not present

## 2018-11-01 DIAGNOSIS — D869 Sarcoidosis, unspecified: Secondary | ICD-10-CM | POA: Diagnosis not present

## 2018-11-01 DIAGNOSIS — E785 Hyperlipidemia, unspecified: Secondary | ICD-10-CM | POA: Diagnosis not present

## 2018-11-01 NOTE — Progress Notes (Signed)
Pulmonary Individual Treatment Plan  Patient Details  Name: SHIRLENE ANDAYA MRN: [id number] Date of Birth: 1961/08/26 Referring Provider:     Pulmonary Rehab Walk Test from 11/01/2018 in [place]  Referring Provider  Dr. Nelda Marseille      Initial Encounter Date:    Pulmonary Rehab Walk Test from 11/01/2018 in [place]  Date  11/01/18      Visit Diagnosis: Sarcoidosis  Patient's Home Medications on Admission:   Current Outpatient Medications:  .  amLODipine (NORVASC) 10 MG tablet, Take 1 tablet (10 mg total) by mouth daily., Disp: 90 tablet, Rfl: 3 .  azaTHIOprine (IMURAN) 50 MG tablet, Take 50 mg by mouth daily., Disp: , Rfl:  .  BAYER CONTOUR TEST test strip, TEST BLOOD SUGAR TWICE DAILY, Disp: 100 each, Rfl: 0 .  blood glucose meter kit and supplies KIT, Dispense based on patient and insurance preference. Use up to four times daily as directed. (FOR ICD-9 250.00, 250.01)., Disp: 1 each, Rfl: 0 .  cholecalciferol (VITAMIN D) 1000 units tablet, Take 1 tablet (1,000 Units total) by mouth daily., Disp: 90 tablet, Rfl: 3 .  glucose blood (ONE TOUCH ULTRA TEST) test strip, Use as instructed, Disp: 100 each, Rfl: 12 .  Insulin Lispro Prot & Lispro (HUMALOG MIX 75/25 KWIKPEN) (75-25) 100 UNIT/ML Kwikpen, Inject 5 Units into the skin 3 (three) times daily., Disp: 15 mL, Rfl: 3 .  Insulin Pen Needle (BD PEN NEEDLE NANO U/F) 32G X 4 MM MISC, INJECT THREE TIMES DAILY AS DIRECTED, Disp: 100 each, Rfl: 11 .  Lancets 30G MISC, Test blood sugars twice daily, Disp: 100 each, Rfl: 2 .  lisinopril-hydrochlorothiazide (PRINZIDE,ZESTORETIC) 20-25 MG tablet, Take 1 tablet by mouth daily., Disp: 90 tablet, Rfl: 3 .  metFORMIN (GLUCOPHAGE-XR) 500 MG 24 hr tablet, Take 1 tablet (500 mg total) by mouth daily with breakfast., Disp: 30 tablet, Rfl: 2 .  ondansetron (ZOFRAN) 4 MG tablet, Take 1 tablet (4 mg total) by mouth every 6 (six) hours as needed  for nausea. (Patient not taking: Reported on 10/26/2018), Disp: 20 tablet, Rfl: 0 .  PEPPERMINT OIL PO, Take 1 drop by mouth See admin instructions. Place 1 drop into 12-16 ounces of water and drink as needed for nausea or headaches, Disp: , Rfl:   Past Medical History: Past Medical History:  Diagnosis Date  . 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 Biceps tendonitis 2/12   Dr. Gladstone Lighter  . History of blood transfusion 1988   "related to hysterectomy" (09/27/2018)  . History of mammogram 05/25/12   stable, no suspicious finding, extremely dense breast tissue  . Hyperlipidemia   . Hypertension   . Neutropenia, unspecified ([place]) 02/2013   slight, likely ethnicity related  . Sarcoidosis    "lung, liver, eyes, skin" (09/27/2018)  . Seasonal allergies   . Shortness of breath dyspnea    with exertion   . Supraspinatus tendonitis 2/12   Dr. Gladstone Lighter  . Type II diabetes mellitus ([place])   . Wears glasses     Tobacco Use: Social History   Tobacco Use  Smoking Status Never Smoker  Smokeless Tobacco Never Used    Labs: Recent Review Flowsheet Data    Labs for ITP Cardiac and Pulmonary Rehab Latest Ref Rng & Units 09/02/2016 10/14/2016 02/16/2017 01/04/2018 08/31/2018   Cholestrol <200 mg/dL 250(H) - 219(H) 214(H) -  LDLCALC mg/dL (calc) 175(H) - 149(H) 102(H) -   HDL >50 mg/dL 60.60 - 61.30 98 -   Trlycerides <150 mg/dL 72.0 - 44.0 49 -   Hemoglobin A1c 4.0 - 5.6 % - 7.1(H) 6.5 5.8(H) 6.6(A)      Capillary Blood Glucose: Lab Results  Component Value Date   GLUCAP 109 (H) 09/29/2018   GLUCAP 139 (H) 09/28/2018   GLUCAP 101 (H) 09/28/2018   GLUCAP 84 09/28/2018   GLUCAP 82 09/28/2018     Pulmonary Assessment Scores: Pulmonary Assessment Scores    Row Name 10/30/18 1633 11/01/18 1626       ADL UCSD   ADL Phase  Entry  Entry    SOB Score total  40  -      CAT Score   CAT Score  8 pre  -      mMRC  Score   mMRC Score  -  1       Pulmonary Function Assessment:   Exercise Target Goals: Exercise Program Goal: Individual exercise prescription set using results from initial 6 min walk test and THRR while considering  patient's activity barriers and safety.   Exercise Prescription Goal: Initial exercise prescription builds to 30-45 minutes a day of aerobic activity, 2-3 days per week.  Home exercise guidelines will be given to patient during program as part of exercise prescription that the participant will acknowledge.  Activity Barriers & Risk Stratification: Activity Barriers & Cardiac Risk Stratification - 10/26/18 0934      Activity Barriers & Cardiac Risk Stratification   Activity Barriers  Arthritis    Cardiac Risk Stratification  Low       6 Minute Walk: 6 Minute Walk    Row Name 11/01/18 1627         6 Minute Walk   Phase  Initial     Distance  1622 feet     Walk Time  6 minutes     # of Rest Breaks  0     MPH  3.07     METS  3.37     RPE  11     Perceived Dyspnea   1     Symptoms  No     Resting HR  84 bpm     Resting BP  128/70     Resting Oxygen Saturation   99 %     Exercise Oxygen Saturation  during 6 min walk  94 %     Max Ex. HR  127 bpm     Max Ex. BP  134/68     2 Minute Post BP  118/72       Interval HR   1 Minute HR  117     2 Minute HR  118     3 Minute HR  115     4 Minute HR  121     5 Minute HR  127     6 Minute HR  122     2 Minute Post HR  89     Interval Heart Rate?  Yes       Interval Oxygen   Interval Oxygen?  Yes     Baseline Oxygen Saturation %  99 %     1 Minute Oxygen Saturation %  98 %     1 Minute Liters of Oxygen  0 L     2 Minute Oxygen Saturation %  95 %     2 Minute Liters of  Oxygen  0 L     3 Minute Oxygen Saturation %  97 %     3 Minute Liters of Oxygen  0 L     4 Minute Oxygen Saturation %  99 %     4 Minute Liters of Oxygen  0 L     5 Minute Oxygen Saturation %  100 %     5 Minute Liters of Oxygen  0 L      6 Minute Oxygen Saturation %  94 %     6 Minute Liters of Oxygen  0 L     2 Minute Post Oxygen Saturation %  100 %     2 Minute Post Liters of Oxygen  0 L        Oxygen Initial Assessment: Oxygen Initial Assessment - 11/01/18 1626      Home Oxygen   Home Oxygen Device  None    Sleep Oxygen Prescription  None    Home Exercise Oxygen Prescription  None    Home at Rest Exercise Oxygen Prescription  None      Initial 6 min Walk   Oxygen Used  None      Program Oxygen Prescription   Program Oxygen Prescription  None       Oxygen Re-Evaluation:   Oxygen Discharge (Final Oxygen Re-Evaluation):   Initial Exercise Prescription: Initial Exercise Prescription - 11/01/18 1600      Date of Initial Exercise RX and Referring Provider   Date  11/01/18    Referring Provider  Dr. Nelda Marseille      Treadmill   MPH  1.7    Grade  0    Minutes  17      Bike   Level  0.4    Minutes  17      NuStep   Level  3    SPM  80    Minutes  17      Prescription Details   Frequency (times per week)  2    Duration  Progress to 45 minutes of aerobic exercise without signs/symptoms of physical distress      Intensity   THRR 40-80% of Max Heartrate  64-129    Ratings of Perceived Exertion  11-13    Perceived Dyspnea  0-4      Progression   Progression  Continue to progress workloads to maintain intensity without signs/symptoms of physical distress.      Resistance Training   Training Prescription  Yes    Weight  orange bands    Reps  10-15       Perform Capillary Blood Glucose checks as needed.  Exercise Prescription Changes:   Exercise Comments:   Exercise Goals and Review: Exercise Goals    Row Name 10/26/18 0940             Exercise Goals   Increase Physical Activity  Yes       Intervention  Provide advice, education, support and counseling about physical activity/exercise needs.;Develop an individualized exercise prescription for aerobic and resistive training  based on initial evaluation findings, risk stratification, comorbidities and participant's personal goals.       Expected Outcomes  Short Term: Attend rehab on a regular basis to increase amount of physical activity.;Long Term: Exercising regularly at least 3-5 days a week.;Long Term: Add in home exercise to make exercise part of routine and to increase amount of physical activity.       Increase Strength and Stamina  Yes  Intervention  Provide advice, education, support and counseling about physical activity/exercise needs.;Develop an individualized exercise prescription for aerobic and resistive training based on initial evaluation findings, risk stratification, comorbidities and participant's personal goals.       Expected Outcomes  Short Term: Increase workloads from initial exercise prescription for resistance, speed, and METs.;Short Term: Perform resistance training exercises routinely during rehab and add in resistance training at home;Long Term: Improve cardiorespiratory fitness, muscular endurance and strength as measured by increased METs and functional capacity (6MWT)       Able to understand and use rate of perceived exertion (RPE) scale  Yes       Intervention  Provide education and explanation on how to use RPE scale       Expected Outcomes  Short Term: Able to use RPE daily in rehab to express subjective intensity level;Long Term:  Able to use RPE to guide intensity level when exercising independently       Able to understand and use Dyspnea scale  Yes       Intervention  Provide education and explanation on how to use Dyspnea scale       Expected Outcomes  Short Term: Able to use Dyspnea scale daily in rehab to express subjective sense of shortness of breath during exertion;Long Term: Able to use Dyspnea scale to guide intensity level when exercising independently       Knowledge and understanding of Target Heart Rate Range (THRR)  Yes       Intervention  Provide education and  explanation of THRR including how the numbers were predicted and where they are located for reference       Expected Outcomes  Short Term: Able to state/look up THRR;Long Term: Able to use THRR to govern intensity when exercising independently;Short Term: Able to use daily as guideline for intensity in rehab       Understanding of Exercise Prescription  Yes       Intervention  Provide education, explanation, and written materials on patient's individual exercise prescription       Expected Outcomes  Short Term: Able to explain program exercise prescription;Long Term: Able to explain home exercise prescription to exercise independently          Exercise Goals Re-Evaluation :   Discharge Exercise Prescription (Final Exercise Prescription Changes):   Nutrition:  Target Goals: Understanding of nutrition guidelines, daily intake of sodium <1523m, cholesterol <2019m calories 30% from fat and 7% or less from saturated fats, daily to have 5 or more servings of fruits and vegetables.  Biometrics:    Nutrition Therapy Plan and Nutrition Goals:   Nutrition Assessments:   Nutrition Goals Re-Evaluation:   Nutrition Goals Discharge (Final Nutrition Goals Re-Evaluation):   Psychosocial: Target Goals: Acknowledge presence or absence of significant depression and/or stress, maximize coping skills, provide positive support system. Participant is able to verbalize types and ability to use techniques and skills needed for reducing stress and depression.  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      Screening Interventions   Interventions  Encouraged to exercise    Expected Outcomes  Long Term Goal: Stressors or current issues are controlled or eliminated.;Short Term goal: Identification and review with participant of any Quality of Life or Depression concerns found by scoring the questionnaire.;Long Term goal: The participant improves quality of Life and PHQ9 Scores as seen by post scores and/or verbalization of changes       Quality of Life Scores:  Scores of 19 and below usually indicate a poorer quality of life in these areas.  A difference of  2-3 points is a clinically meaningful difference.  A difference of 2-3 points in the total score of the Quality of Life Index has been associated with significant improvement in overall quality of life, self-image, physical symptoms, and general health in studies assessing change in quality of life.  PHQ-9: Recent Review Flowsheet Data    Depression screen [facility] 2/9 10/26/2018 10/24/2018 07/21/2017   Decreased Interest 1 1 0   Down, Depressed, Hopeless 1 1 0   PHQ - 2 Score 2 2 0   Altered sleeping 0 0 -   Tired, decreased energy 1 0 -   Change in appetite 0 0 -   Feeling bad or failure about yourself  0 0 -   Trouble concentrating 1 1 -   Moving slowly or fidgety/restless 0 0 -   Suicidal thoughts 0 0 -   PHQ-9 Score 4 3 -   Difficult doing work/chores Not difficult at all - -     Interpretation of Total Score  Total Score Depression Severity:  1-4 = Minimal depression, 5-9 = Mild depression, 10-14 = Moderate depression, 15-19 = Moderately severe depression, 20-27 = Severe depression   Psychosocial Evaluation and Intervention:   Psychosocial Re-Evaluation:   Psychosocial Discharge (Final Psychosocial Re-Evaluation):   Education: Education Goals: Education classes will be provided on a weekly basis, covering required topics. Participant will state  understanding/return demonstration of topics presented.  Learning Barriers/Preferences: Learning Barriers/Preferences - 10/26/18 1004      Learning Barriers/Preferences   Learning Barriers  Sight    Learning Preferences  Written Material;Computer/Internet;Group Instruction;Individual Instruction       Education Topics: Risk Factor Reduction:  -Group instruction that is supported by a PowerPoint presentation. Instructor discusses the definition of a risk factor, different risk factors for pulmonary disease, and how the heart and lungs work together.     Nutrition for Pulmonary Patient:  -Group instruction provided by PowerPoint slides, verbal discussion, and written materials to support subject matter. The instructor gives an explanation and review of healthy diet recommendations, which includes a discussion on weight management, recommendations for fruit and vegetable consumption, as well as protein, fluid, caffeine, fiber, sodium, sugar, and alcohol. Tips for eating when patients are short of breath are discussed.   Pursed Lip Breathing:  -Group instruction that is supported by demonstration and informational handouts. Instructor discusses the benefits of pursed lip and diaphragmatic breathing and detailed demonstration on how to preform both.     Oxygen Safety:  -Group instruction provided by PowerPoint, verbal discussion, and written material to support subject matter. There is an overview of "What is Oxygen" and "Why do we need it".  Instructor also reviews how to create a safe environment for oxygen use, the importance of using oxygen as prescribed, and the risks of noncompliance. There is a brief discussion on traveling with oxygen and  resources the patient may utilize.   Oxygen Equipment:  -Group instruction provided by [facility] Staff utilizing handouts, written materials, and equipment demonstrations.   Signs and Symptoms:  -Group instruction provided by written material  and verbal discussion to support subject matter. Warning signs and symptoms of infection, stroke, and heart attack are reviewed and when to call the physician/911 reinforced. Tips for preventing the spread of infection discussed.   Advanced Directives:  -Group instruction provided by verbal instruction and written material to support subject matter. Instructor reviews Advanced Directive laws and proper instruction for filling out document.   Pulmonary Video:  -Group video education that reviews the importance of medication and oxygen compliance, exercise, good nutrition, pulmonary hygiene, and pursed lip and diaphragmatic breathing for the pulmonary patient.   Exercise for the Pulmonary Patient:  -Group instruction that is supported by a PowerPoint presentation. Instructor discusses benefits of exercise, core components of exercise, frequency, duration, and intensity of an exercise routine, importance of utilizing pulse oximetry during exercise, safety while exercising, and options of places to exercise outside of rehab.     Pulmonary Medications:  -Verbally interactive group education provided by instructor with focus on inhaled medications and proper administration.   Anatomy and Physiology of the Respiratory System and Intimacy:  -Group instruction provided by PowerPoint, verbal discussion, and written material to support subject matter. Instructor reviews respiratory cycle and anatomical components of the respiratory system and their functions. Instructor also reviews differences in obstructive and restrictive respiratory diseases with examples of each. Intimacy, Sex, and Sexuality differences are reviewed with a discussion on how relationships can change when diagnosed with pulmonary disease. Common sexual concerns are reviewed.   MD DAY -A group question and answer session with a medical doctor that allows participants to ask questions that relate to their pulmonary disease  state.   OTHER EDUCATION -Group or individual verbal, written, or video instructions that support the educational goals of the pulmonary rehab program.   Holiday Eating Survival Tips:  -Group instruction provided by PowerPoint slides, verbal discussion, and written materials to support subject matter. The instructor gives patients tips, tricks, and techniques to help them not only survive but enjoy the holidays despite the onslaught of food that accompanies the holidays.   Knowledge Questionnaire Score: Knowledge Questionnaire Score - 10/30/18 1634      Knowledge Questionnaire Score   Pre Score  12/18       Core Components/Risk Factors/Patient Goals at Admission: Personal Goals and Risk Factors at Admission - 10/26/18 1006      Core Components/Risk Factors/Patient Goals on Admission    Weight Management  Yes;Weight Loss    Intervention  Obesity: Provide education and appropriate resources to help participant work on and attain dietary goals.;Weight Management/Obesity: Establish reasonable short term and long term weight goals.;Weight Management: Provide education and appropriate resources to help participant work on and attain dietary goals.;Weight Management: Develop a combined nutrition and exercise program designed to reach desired caloric intake, while maintaining appropriate intake of nutrient and fiber, sodium and fats, and appropriate energy expenditure required for the weight goal.    Admit Weight  158 lb 15.2 oz (72.1 kg)    Goal Weight: Short Term  150 lb (68 kg)    Goal Weight: Long Term  140 lb (63.5 kg)    Expected Outcomes  Weight Loss: Understanding of general recommendations for a balanced deficit meal plan, which promotes 1-2 lb weight loss per week and includes a negative energy balance of  [phone] kcal/d;Long Term: Adherence to nutrition and physical activity/exercise program aimed toward attainment of established weight goal;Short Term: Continue to assess and modify  interventions until short term weight is achieved;Understanding recommendations for meals to include 15-35% energy as protein, 25-35% energy from fat, 35-60% energy from carbohydrates, less than 262m of dietary cholesterol, 20-35 gm of total fiber daily;Understanding of distribution of calorie intake throughout the day with the consumption of 4-5 meals/snacks    Diabetes  Yes    Intervention  Provide education about signs/symptoms and action to take for hypo/hyperglycemia.;Provide education about proper nutrition, including hydration, and aerobic/resistive exercise prescription along with prescribed medications to achieve blood glucose in normal ranges: Fasting glucose 65-99 mg/dL    Expected Outcomes  Short Term: Participant verbalizes understanding of the signs/symptoms and immediate care of hyper/hypoglycemia, proper foot care and importance of medication, aerobic/resistive exercise and nutrition plan for blood glucose control.;Long Term: Attainment of HbA1C < 7%.    Hypertension  Yes    Intervention  Provide education on lifestyle modifcations including regular physical activity/exercise, weight management, moderate sodium restriction and increased consumption of fresh fruit, vegetables, and low fat dairy, alcohol moderation, and smoking cessation.;Monitor prescription use compliance.    Expected Outcomes  Long Term: Maintenance of blood pressure at goal levels.;Short Term: Continued assessment and intervention until BP is < 140/941mHG in hypertensive participants. < 130/808mG in hypertensive participants with diabetes, heart failure or chronic kidney disease.    Lipids  Yes    Intervention  Provide education and support for participant on nutrition & aerobic/resistive exercise along with prescribed medications to achieve LDL <64m39mDL >40mg8m Expected Outcomes  Short Term: Participant states understanding of desired cholesterol values and is compliant with medications prescribed. Participant is  following exercise prescription and nutrition guidelines.;Long Term: Cholesterol controlled with medications as prescribed, with individualized exercise RX and with personalized nutrition plan. Value goals: LDL < 64mg,18m > 40 mg.    Stress  Yes    Intervention  Offer individual and/or small group education and counseling on adjustment to heart disease, stress management and health-related lifestyle change. Teach and support self-help strategies.    Expected Outcomes  Short Term: Participant demonstrates changes in health-related behavior, relaxation and other stress management skills, ability to obtain effective social support, and compliance with psychotropic medications if prescribed.;Long Term: Emotional wellbeing is indicated by absence of clinically significant psychosocial distress or social isolation.       Core Components/Risk Factors/Patient Goals Review:    Core Components/Risk Factors/Patient Goals at Discharge (Final Review):    ITP Comments: ITP Comments    Row Name 10/26/18 0933           ITP Comments  Dr. WessamManfred Archcal Director Pulmonary Rehab          Comments:

## 2018-11-08 ENCOUNTER — Telehealth: Payer: Self-pay

## 2018-11-08 ENCOUNTER — Encounter: Payer: Self-pay | Admitting: Medical

## 2018-11-08 ENCOUNTER — Ambulatory Visit: Payer: 59 | Admitting: Medical

## 2018-11-08 ENCOUNTER — Encounter (HOSPITAL_COMMUNITY)
Admission: RE | Admit: 2018-11-08 | Discharge: 2018-11-08 | Disposition: A | Discharge status: Home / Self Care | Payer: 59 | Source: Ambulatory Visit | Attending: Pulmonary Disease | Admitting: Pulmonary Disease

## 2018-11-08 ENCOUNTER — Other Ambulatory Visit: Payer: Self-pay | Admitting: Medical

## 2018-11-08 VITALS — BP 120/70 | HR 88 | Temp 98.2°F | Resp 16 | Ht 64.0 in | Wt 162.8 lb

## 2018-11-08 DIAGNOSIS — R202 Paresthesia of skin: Secondary | ICD-10-CM | POA: Diagnosis not present

## 2018-11-08 DIAGNOSIS — R52 Pain, unspecified: Secondary | ICD-10-CM

## 2018-11-08 DIAGNOSIS — D869 Sarcoidosis, unspecified: Secondary | ICD-10-CM

## 2018-11-08 DIAGNOSIS — M778 Other enthesopathies, not elsewhere classified: Secondary | ICD-10-CM | POA: Diagnosis not present

## 2018-11-08 DIAGNOSIS — M79642 Pain in left hand: Secondary | ICD-10-CM | POA: Insufficient documentation

## 2018-11-08 DIAGNOSIS — M545 Low back pain, unspecified: Secondary | ICD-10-CM

## 2018-11-08 DIAGNOSIS — M791 Myalgia, unspecified site: Secondary | ICD-10-CM

## 2018-11-08 NOTE — Progress Notes (Signed)
Daily Session Note  Patient Details  Name: Bethany Webb MRN: [id number] Date of Birth: 1961-11-16 Referring Provider:     Pulmonary Rehab Walk Test from 11/01/2018 in [place]  Referring Provider  Dr. Nelda Marseille      Encounter Date: 11/08/2018  Check In: Session Check In - 11/08/18 1030      Check-In   Supervising physician immediately available to respond to emergencies  [facility] immediately available    Physician(s)  Dr. Maryland Pink    Location  [facility]    Staff Present  Rosebud Poles, RN, BSN;Carlette Wilber Oliphant, RN, Bjorn Loser, MS, Exercise Physiologist;Lisa Ysidro Evert, RN    Medication changes reported      No    Fall or balance concerns reported     No    Tobacco Cessation  No Change    Warm-up and Cool-down  Performed as group-led instruction    Resistance Training Performed  Yes    VAD Patient?  No    PAD/SET Patient?  No      Pain Assessment   Currently in Pain?  No/denies    Multiple Pain Sites  No       Capillary Blood Glucose: No results found for this or any previous visit (from the past 24 hour(s)).    Social History   Tobacco Use  Smoking Status Never Smoker  Smokeless Tobacco Never Used    Goals Met:  Proper associated with RPD/PD & O2 Sat Exercise tolerated well  Goals Unmet:  Not Applicable  Comments: Service time is from 1030 to 1205    Dr. Rush Farmer is Medical Director for Pulmonary Rehab at [facility].

## 2018-11-08 NOTE — Telephone Encounter (Signed)
Patient has appointment with Dr Erlinda Hong at [place] on 11-14-18 at 3:00.  Left message on voicemail for with appointment info.

## 2018-11-08 NOTE — Progress Notes (Signed)
Subjective: Chief Complaint  Patient presents with  . right hand    right hand pain still having    I saw her on October 30 for follow-up on multiple issues including diabetes, nausea, back and leg pain, carpal tunnel syndrome, sarcoidosis  She continues report pain in the right hand.  Been using wrist brace and icing since August for right arm pain.  She has had right carpal tunnel release prior, 5 years ago with Dr. Earle Gell.  Lately getting pain in left thumb, left lateral forearm.  No numbness or tingling in right arm.   No swelling.  Couldn't hold scissors, hard to comb hair.    She is already seeing Dr. Erlinda Hong for left arm pain, likely CTS.  Never heard back about nerve conduction study.  Is right handed.  She continues to have back and leg pain- much improved since last visit.  Unclear exactly what was causing the pain.  Her sarcoid specialist told her that her pains are not likely induced by sarcoid.  Her general back and muscle pains seemed to be worse on Pravachol.  her pains improved after stopping statin. We haven't restated statin yet.  She had lumbar spine xray 06/08/18 showing mild scoliosis. Facet osteoarthritic change at L5-S1 bilaterally. No appreciable disc space narrowing. No fracture or spondylolisthesis.  Reminder, her very first symptoms that prompted sarcoidosis diagnosis was presentation of pain and paresthesia in the feet with subsequent elevated LFTs that go the evaluation going to subsequently find sarcoidosis back in 2017   Past Medical History:  Diagnosis Date  . 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 Biceps tendonitis 2/12   Dr. Gladstone Lighter  . History of blood transfusion 1988   "related to hysterectomy" (09/27/2018)  . History of mammogram 05/25/12   stable, no suspicious finding, extremely dense breast tissue  . Hyperlipidemia   . Hypertension   . Neutropenia, unspecified ([place])  02/2013   slight, likely ethnicity related  . Sarcoidosis    "lung, liver, eyes, skin"   . Seasonal allergies   . Shortness of breath dyspnea    with exertion   . Supraspinatus tendonitis 2/12   Dr. Gladstone Lighter  . Type II diabetes mellitus ([place])   . Wears glasses    Current Outpatient Medications on File Prior to Visit  Medication Sig Dispense Refill  . amLODipine (NORVASC) 10 MG tablet Take 1 tablet (10 mg total) by mouth daily. 90 tablet 3  . azaTHIOprine (IMURAN) 50 MG tablet Take 50 mg by mouth daily.    Marland Kitchen BAYER CONTOUR TEST test strip TEST BLOOD SUGAR TWICE DAILY 100 each 0  . blood glucose meter kit and supplies KIT Dispense based on patient and insurance preference. Use up to four times daily as directed. (FOR ICD-9 250.00, 250.01). 1 each 0  . cholecalciferol (VITAMIN D) 1000 units tablet Take 1 tablet (1,000 Units total) by mouth daily. 90 tablet 3  . Insulin Lispro Prot & Lispro (HUMALOG MIX 75/25 KWIKPEN) (75-25) 100 UNIT/ML Kwikpen Inject 5 Units into the skin 3 (three) times daily. 15 mL 3  . Insulin Pen Needle (BD PEN NEEDLE NANO U/F) 32G X 4 MM MISC INJECT THREE TIMES DAILY AS DIRECTED 100 each 11  . Lancets 30G MISC Test blood sugars twice daily 100 each 2  . lisinopril-hydrochlorothiazide (PRINZIDE,ZESTORETIC) 20-25 MG tablet Take 1 tablet by mouth daily. 90 tablet 3  . metFORMIN (GLUCOPHAGE-XR) 500  MG 24 hr tablet Take 1 tablet (500 mg total) by mouth daily with breakfast. 30 tablet 2  . ondansetron (ZOFRAN) 4 MG tablet Take 1 tablet (4 mg total) by mouth every 6 (six) hours as needed for nausea. 20 tablet 0  . PEPPERMINT OIL PO Take 1 drop by mouth See admin instructions. Place 1 drop into 12-16 ounces of water and drink as needed for nausea or headaches     No current facility-administered medications on file prior to visit.    ROS as in subjective    Objective: BP 120/70   Pulse 88   Temp 98.2 F (36.8 C) (Oral)   Resp 16   Ht '5\' 4"'  (1.626 m)   Wt 162 lb 12.8  oz (73.8 kg)   LMP 12/27/1987   SpO2 98%   BMI 27.94 kg/m   Wt Readings from Last 3 Encounters:  11/08/18 162 lb 12.8 oz (73.8 kg)  10/26/18 158 lb 15.2 oz (72.1 kg)  10/24/18 160 lb 3.2 oz (72.7 kg)   Gen: wd, wn, nad Skin unremarkable Quite tender over right lateral wrist and lateral thumb, lateral wrist pain with range of motion, slight puffiness over the lateral wrist, otherwise hand and forearm nontender to palpation, although she reports pain in her entire arm with range of motion Ext: no edema Pulses WNL Neuro: cn2-12 intact, nonfocal exam, normal strenght of extremities   Assessment: Encounter Diagnoses  Name Primary?  . Right wrist tendonitis Yes  . Left hand pain   . Left hand paresthesia   . Diffuse pain   . Paresthesia   . Sarcoidosis   . Low back pain, unspecified back pain laterality, unspecified chronicity, unspecified whether sciatica present   . Myalgia      Plan: Her exam today suggest right wrist tendinitis, ongoing left hand pain and paresthesias suggestive of carpal tunnel syndrome.  She has a history of carpal tunnel release on the right.  We discussed her variety of pains in general.  I read the email where her sarcoidosis specialist advised that he did not think her pains were related to sarcoidosis.  She has continued to complain of various pains in the last couple years.  She has chest pain serious enough to warrant emergency department evaluation a few months ago along with extreme nausea.  Ultimately this may have been medication related with Ozempic and statin, nevertheless she has had various pains that range from mild to severe throughout the last 2 years  I advised she follow-up with Dr. Erlinda Hong, with orthopedics to discuss her acute right wrist tendinitis, continue plan for nerve conduction studies of left wrist, but also to address her general pains.  It is not clear whether her back and general arm and leg symptoms suggest fibromyalgia or if these  are just isolated arthritic issues or musculoskeletal pain in general   I reviewed the imaging in the chart from the x-ray of her lumbar spine June 2019, July 2018 right shoulder x-ray.that   I advised that if orthopedist feels like her issue may be more of fibromyalgia situation with the muscle pains and back pain and leg pains, then routine exercise and stretching is a mainstay of treatment for fibromyalgia.    She continues in pulmonary rehab currently which I am glad to hear.  Consider restarting statin when she has eval back with orthopedics  Kerstie was seen today for right hand.  Diagnoses and all orders for this visit:  Right wrist tendonitis  Left  hand pain  Left hand paresthesia  Diffuse pain  Paresthesia  Sarcoidosis  Low back pain, unspecified back pain laterality, unspecified chronicity, unspecified whether sciatica present  Myalgia

## 2018-11-08 NOTE — Telephone Encounter (Signed)
Left message on voicemail for [facility] to call back to schedule patient appointment to see Dr Erlinda Hong.

## 2018-11-13 ENCOUNTER — Encounter (HOSPITAL_COMMUNITY)
Admission: RE | Admit: 2018-11-13 | Discharge: 2018-11-13 | Disposition: A | Discharge status: Home / Self Care | Payer: 59 | Source: Ambulatory Visit | Attending: Pulmonary Disease | Admitting: Pulmonary Disease

## 2018-11-13 ENCOUNTER — Telehealth (INDEPENDENT_AMBULATORY_CARE_PROVIDER_SITE_OTHER): Payer: Self-pay

## 2018-11-13 DIAGNOSIS — D869 Sarcoidosis, unspecified: Secondary | ICD-10-CM

## 2018-11-13 DIAGNOSIS — E785 Hyperlipidemia, unspecified: Secondary | ICD-10-CM | POA: Diagnosis not present

## 2018-11-13 DIAGNOSIS — Z794 Long term (current) use of insulin: Secondary | ICD-10-CM | POA: Diagnosis not present

## 2018-11-13 NOTE — Progress Notes (Signed)
Daily Session Note  Patient Details  Name: Bethany Webb MRN: [id number] Date of Birth: 1961-01-23 Referring Provider:     Pulmonary Rehab Walk Test from 11/01/2018 in [place]  Referring Provider  Dr. Nelda Marseille      Encounter Date: 11/13/2018  Check In: Session Check In - 11/13/18 1244      Check-In   Supervising physician immediately available to respond to emergencies  [facility] immediately available    Physician(s)  Dr. Maryland Pink    Location  [facility]    Staff Present  Maurice Small, RN, BSN;Lisa Ysidro Evert, RN;Dalton Kris Mouton, MS, Exercise Physiologist;Olinty Celesta Aver, MS, ACSM CEP, Exercise Physiologist    Medication changes reported      No    Fall or balance concerns reported     No    Tobacco Cessation  No Change    Warm-up and Cool-down  Performed as group-led instruction    Resistance Training Performed  Yes    VAD Patient?  No    PAD/SET Patient?  No      Pain Assessment   Currently in Pain?  No/denies    Pain Score  0-No pain    Multiple Pain Sites  No       Capillary Blood Glucose: No results found for this or any previous visit (from the past 24 hour(s)).    Social History   Tobacco Use  Smoking Status Never Smoker  Smokeless Tobacco Never Used    Goals Met:  Proper associated with RPD/PD & O2 Sat Exercise tolerated well  Goals Unmet:  Not Applicable  Comments: Service time is from 1030 to [place]    Dr. Rush Farmer is Medical Director for Pulmonary Rehab at [facility].

## 2018-11-13 NOTE — Telephone Encounter (Signed)
"  Pt is W/C I checked referral and authorization is still pending."

## 2018-11-13 NOTE — Telephone Encounter (Signed)
  Per Patient this is not W/C----NCS/EMG   W/C right arm only.

## 2018-11-13 NOTE — Telephone Encounter (Signed)
Tried to call patient no answer LMOM to return call.

## 2018-11-14 ENCOUNTER — Ambulatory Visit (INDEPENDENT_AMBULATORY_CARE_PROVIDER_SITE_OTHER): Payer: Self-pay | Admitting: Orthopaedic Surgery

## 2018-11-14 ENCOUNTER — Other Ambulatory Visit: Payer: Self-pay | Admitting: Medical

## 2018-11-15 ENCOUNTER — Telehealth (INDEPENDENT_AMBULATORY_CARE_PROVIDER_SITE_OTHER): Payer: Self-pay | Admitting: Orthopaedic Surgery

## 2018-11-15 ENCOUNTER — Encounter (HOSPITAL_COMMUNITY)
Admission: RE | Admit: 2018-11-15 | Discharge: 2018-11-15 | Disposition: A | Discharge status: Home / Self Care | Payer: 59 | Source: Ambulatory Visit | Attending: Pulmonary Disease | Admitting: Pulmonary Disease

## 2018-11-15 DIAGNOSIS — D869 Sarcoidosis, unspecified: Secondary | ICD-10-CM

## 2018-11-15 NOTE — Progress Notes (Signed)
Daily Session Note  Patient Details  Name: Bethany Webb MRN: [id number] Date of Birth: [birth year] Referring Provider:     Pulmonary Rehab Walk Test from 11/01/2018 in [place]  Referring Provider  Dr. Nelda Marseille      Encounter Date: 11/15/2018  Check In: Session Check In - 11/15/18 1030      Check-In   Supervising physician immediately available to respond to emergencies  [facility] immediately available    Physician(s)  Dr. Cruzita Lederer    Location  [facility]    Staff Present  Rosebud Poles, RN, BSN;Carlette Wilber Oliphant, RN, BSN;Lisa Ysidro Evert, RN;Dalton Kris Mouton, MS, Exercise Physiologist;Brittany Durene Fruits, BS, ACSM CEP, Exercise Physiologist    Medication changes reported      No    Fall or balance concerns reported     No    Tobacco Cessation  No Change    Warm-up and Cool-down  Performed as group-led instruction    Resistance Training Performed  Yes    VAD Patient?  No    PAD/SET Patient?  No      Pain Assessment   Currently in Pain?  No/denies    Multiple Pain Sites  No       Capillary Blood Glucose: No results found for this or any previous visit (from the past 24 hour(s)).    Social History   Tobacco Use  Smoking Status Never Smoker  Smokeless Tobacco Never Used    Goals Met:  Proper associated with RPD/PD & O2 Sat Exercise tolerated well  Goals Unmet:  Not Applicable  Comments: Service time is from 1030 to 1200    Dr. Rush Farmer is Medical Director for Pulmonary Rehab at [facility].

## 2018-11-15 NOTE — Telephone Encounter (Signed)
Patient left a voice mail message stating she was returning your call.

## 2018-11-15 NOTE — Telephone Encounter (Signed)
Patient called stated that she was returning a call to you.  Please call patient to advise.

## 2018-11-15 NOTE — Telephone Encounter (Signed)
Did not call patient. I already spoke to her previously. See other message.

## 2018-11-16 ENCOUNTER — Encounter (INDEPENDENT_AMBULATORY_CARE_PROVIDER_SITE_OTHER): Payer: Self-pay | Admitting: Orthopaedic Surgery

## 2018-11-16 ENCOUNTER — Other Ambulatory Visit (INDEPENDENT_AMBULATORY_CARE_PROVIDER_SITE_OTHER): Payer: Self-pay

## 2018-11-16 ENCOUNTER — Ambulatory Visit (INDEPENDENT_AMBULATORY_CARE_PROVIDER_SITE_OTHER): Payer: 59 | Admitting: Orthopaedic Surgery

## 2018-11-16 DIAGNOSIS — G5602 Carpal tunnel syndrome, left upper limb: Secondary | ICD-10-CM

## 2018-11-16 DIAGNOSIS — M654 Radial styloid tenosynovitis [de Quervain]: Secondary | ICD-10-CM | POA: Diagnosis not present

## 2018-11-16 MED ORDER — METHYLPREDNISOLONE 4 MG PO TBPK: ORAL_TABLET | ORAL | 0 refills | Status: DC

## 2018-11-16 NOTE — Progress Notes (Signed)
Office Visit Note   Patient: Bethany Webb           Date of Birth: 06-10-1961           MRN: [id number] Visit Date: 11/16/2018              Requested by: Carlena Hurl, PA-C [address] [place], [place] [address] PCP: Carlena Hurl, PA-C   Assessment & Plan: Visit Diagnoses:  1. De Quervain's disease (tenosynovitis)   2. Left carpal tunnel syndrome     Plan: Impression is right de Quervain's tenosynovitis.  I have offered a cortisone injection to the patient but she has politely declined.  She would like to try a removable thumb spica splint and a course of steroids.  We will call this in.  She will follow-up with [name] as needed.  Follow-Up Instructions: Return if symptoms worsen or fail to improve.   Orders:  No orders of the defined types were placed in this encounter.  Meds ordered this encounter  Medications  . methylPREDNISolone (MEDROL DOSEPAK) 4 MG TBPK tablet    Sig: Take as directed    Dispense:  21 tablet    Refill:  0      Procedures: No procedures performed   Clinical Data: No additional findings.   Subjective: Chief Complaint  Patient presents with  . Right Hand - Pain  . Neck - Pain    HPI patient is a pleasant 57 year old female presents to clinic today with right upper extremity pain.  This began approximately 3 months ago without any known injury or change in activity.  Pain she has is primarily to the forearm over the first dorsal compartment.  This does occasionally extend up her arm and into the side of her neck.  Her pain is worsened with writing or any twisting motion of the right wrist.  She has taken ibuprofen with good relief of symptoms.  She denies any numbness, tingling or burning to the right upper extremity.  Of note, she is status post cervical spine fusion 6 to 7 years ago.  Review of Systems as detailed in HPI.  All others are negative.   Objective: Vital Signs: LMP 12/27/1987   Physical Exam well-developed  well-nourished female no acute distress.  Alert and oriented x3.  Ortho Exam examination of the right upper extremity reveals marked tenderness over the first dorsal compartment.  Markedly positive Wynn Maudlin.  Negative grind test.  Normal exam of the cervical spine and right shoulder.  She is neurovascularly intact distally.  Specialty Comments:  No specialty comments available.  Imaging: No new imaging   PMFS History: Patient Active Problem List   Diagnosis Date Noted  . De Quervain's disease (tenosynovitis) 11/16/2018  . Right wrist tendonitis 11/08/2018  . Left hand pain 11/08/2018  . Myalgia 11/08/2018  . Low back pain 11/08/2018  . Lower extremity pain, diffuse 10/01/2018  . Hypokalemia 09/28/2018  . Tachycardia 09/27/2018  . Diffuse pain 09/27/2018  . Near syncope 09/27/2018  . Left hand paresthesia 08/31/2018  . Carpal tunnel syndrome 08/31/2018  . Arthralgia of hand, right 08/31/2018  . Abdominal pain 06/08/2018  . Elevated lipase 06/08/2018  . Radicular pain of lower extremity 06/08/2018  . Acute bilateral low back pain with sciatica 06/08/2018  . Paresthesia 04/30/2018  . Weight gain 01/09/2018  . High risk medication use 01/09/2018  . Need for influenza vaccination 09/21/2017  . Vaccine counseling 09/21/2017  . Nausea and vomiting 09/15/2016  . Abdominal  pain, epigastric 09/15/2016  . RUQ pain 09/15/2016  . 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 Sarcoidosis 01/21/2016  . 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 Abnormal liver function 01/21/2016  . Elevated liver enzymes 01/05/2016  . Abnormal chest CT 01/05/2016  . Lymphadenopathy, mediastinal 01/05/2016  . Pleural plaque 01/05/2016  . Pulmonary nodule 01/05/2016  . Anorexia 01/05/2016  . Night sweats 01/05/2016  . Allergic conjunctivitis 10/18/2011  . Allergic rhinitis 10/18/2011  . Diabetes mellitus with complication ([place]) 62/95/2841  . Hyperlipidemia  associated with type 2 diabetes mellitus ([place]) 10/06/2011  . Hypertension associated with diabetes ([place]) 10/06/2011   Past Medical History:  Diagnosis Date  . 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 Biceps tendonitis 2/12   Dr. Gladstone Lighter  . History of blood transfusion 1988   "related to hysterectomy" (09/27/2018)  . History of mammogram 05/25/12   stable, no suspicious finding, extremely dense breast tissue  . Hyperlipidemia   . Hypertension   . Neutropenia, unspecified ([place]) 02/2013   slight, likely ethnicity related  . Sarcoidosis    "lung, liver, eyes, skin"   . Seasonal allergies   . Shortness of breath dyspnea    with exertion   . Supraspinatus tendonitis 2/12   Dr. Gladstone Lighter  . Type II diabetes mellitus ([place])   . Wears glasses     Family History  Problem Relation Age of Onset  . Heart disease Father 39       died of MI, CABG   . Multiple sclerosis Sister   . Asthma Brother   . Cancer Paternal Grandmother        breast  . Diabetes Other        maternal and paternal grandmother  . Cancer Maternal Uncle        hematologic  . Hypertension Maternal Grandmother   . Cancer Maternal Grandfather        prostate  . Hypertension Maternal Grandfather   . Asthma Brother   . Eczema Sister   . Allergies Sister   . Healthy Daughter     Past Surgical History:  Procedure Laterality Date  . ABDOMINAL HYSTERECTOMY  1990   and BSO; endometriosis  . ANTERIOR CERVICAL DECOMP/DISCECTOMY FUSION N/A 06/20/2013   Procedure: ANTERIOR CERVICAL DECOMPRESSION/DISCECTOMY FUSION 1 LEVEL;  Surgeon: Ophelia Charter, MD;  Location: [facility];  Service: Neurosurgery;  Laterality: N/A;  C45 anterior cervical decompression with fusion interbody prothesis plating and bonegraft  . BACK SURGERY    . CARPAL TUNNEL RELEASE Right 06/20/2013   Procedure: CARPAL TUNNEL RELEASE;  Surgeon: Ophelia Charter, MD;  Location: [facility];  Service: Neurosurgery;  Laterality: Right;  RIGHT carpal tunnel release  . COLONOSCOPY  03/06/12   sigmoid polyp; repeat 10 years; Dr. Tora Duck  . LUNG SURGERY  1987   endometriosis in lungs  . REDUCTION MAMMAPLASTY  1994  . SUPRACLAVICAL NODE BIOPSY Left 01/14/2016   Procedure: LEFT SUPRACLAVICAL NODE BIOPSY;  Surgeon: Melrose Nakayama, MD;  Location: [facility];  Service: Thoracic;  Laterality: Left;  . TONSILLECTOMY     Social History   Occupational History  . Occupation: Midwife  Tobacco Use  . Smoking status: Never Smoker  . Smokeless tobacco: Never Used  Substance and Sexual Activity  . Alcohol use: Yes    Alcohol/week: 0.0 standard drinks    Comment: rare  wine, maybe 2-3 times years  . Drug use: No  . Sexual activity: Not on file

## 2018-11-20 ENCOUNTER — Encounter (HOSPITAL_COMMUNITY): Payer: 59

## 2018-11-20 DIAGNOSIS — D869 Sarcoidosis, unspecified: Secondary | ICD-10-CM | POA: Diagnosis not present

## 2018-11-23 NOTE — Addendum Note (Signed)
Encounter addended by: Ivonne Andrew, RD on: 11/23/2018 11:53 AM  Actions taken: Visit Navigator Flowsheet section accepted, Sign clinical note

## 2018-11-23 NOTE — Progress Notes (Signed)
Bethany Webb 57 y.o. female  DOB: 11/11/1961 MRN: [id number]           Nutrition Note 1. Sarcoidosis    Past Medical History:  Diagnosis Date  . 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 Biceps tendonitis 2/12   Dr. Gioffre  . History of blood transfusion 1988   "related to hysterectomy" (09/27/2018)  . History of mammogram 05/25/12   stable, no suspicious finding, extremely dense breast tissue  . Hyperlipidemia   . Hypertension   . Neutropenia, unspecified ([facility]) 02/2013   slight, likely ethnicity related  . Sarcoidosis    "lung, liver, eyes, skin"   . Seasonal allergies   . Shortness of breath dyspnea    with exertion   . Supraspinatus tendonitis 2/12   Dr. Gioffre  . Type II diabetes mellitus ([facility])   . Wears glasses    Meds reviewed.   Current Outpatient Medications (Endocrine & Metabolic):  .  Insulin Lispro Prot & Lispro (HUMALOG MIX 75/25 KWIKPEN) (75-25) 100 UNIT/ML Kwikpen, Inject 5 Units into the skin 3 (three) times daily. .  metFORMIN (GLUCOPHAGE-XR) 500 MG 24 hr tablet, Take 1 tablet (500 mg total) by mouth daily with breakfast. .  methylPREDNISolone (MEDROL DOSEPAK) 4 MG TBPK tablet, Take as directed  Current Outpatient Medications (Cardiovascular):  .  amLODipine (NORVASC) 10 MG tablet, Take 1 tablet (10 mg total) by mouth daily. .  lisinopril-hydrochlorothiazide (PRINZIDE,ZESTORETIC) 20-25 MG tablet, Take 1 tablet by mouth daily. .  amLODipine (NORVASC) 10 MG tablet, TAKE 1 TABLET BY MOUTH EVERY DAY     Current Outpatient Medications (Other):  .  BAYER CONTOUR TEST test strip, TEST BLOOD SUGAR TWICE DAILY .  blood glucose meter kit and supplies KIT, Dispense based on patient and insurance preference. Use up to four times daily as directed. (FOR ICD-9 250.00, 250.01). .  cholecalciferol (VITAMIN D) 1000 units tablet, Take 1 tablet (1,000 Units total) by mouth daily. .  Insulin Pen  Needle (BD PEN NEEDLE NANO U/F) 32G X 4 MM MISC, INJECT THREE TIMES DAILY AS DIRECTED .  Lancets 30G MISC, Test blood sugars twice daily .  azaTHIOprine (IMURAN) 50 MG tablet, Take 50 mg by mouth daily. .  glucose blood (ONE TOUCH ULTRA TEST) test strip, USE AS DIRECTED .  ondansetron (ZOFRAN) 4 MG tablet, Take 1 tablet (4 mg total) by mouth every 6 (six) hours as needed for nausea. .  PEPPERMINT OIL PO, Take 1 drop by mouth See admin instructions. Place 1 drop into 12-16 ounces of water and drink as needed for nausea or headaches   Ht: Ht Readings from Last 1 Encounters:  11/08/18 5' 4" (1.626 m)     Wt:  Wt Readings from Last 3 Encounters:  11/08/18 162 lb 12.8 oz (73.8 kg)  10/26/18 158 lb 15.2 oz (72.1 kg)  10/24/18 160 lb 3.2 oz (72.7 kg)     BMI: Body mass index is 27.72 kg/m.    Current tobacco use? No    Labs:  Lipid Panel     Component Value Date/Time   CHOL 214 (H) 01/04/2018 0834   TRIG 49 01/04/2018 0834   HDL 98 01/04/2018 0834   CHOLHDL 2.2 01/04/2018 0834   VLDL 8.8 02/16/2017 0944   LDLCALC 102 (H) 01/04/2018 0834    Lab Results  Component Value Date   HGBA1C 6.6 (A) 08/31/2018    Nutrition Diagnosis  ?   Food-and nutrition-related knowledge deficit related to lack of exposure to information as related to diagnosis of pulmonary disease ? Overweight/obesity related to excessive energy intake as evidenced by a BMI of Body mass index is 27.72 kg/m.  Goal(s)  1. Pt to identify and limit food sources of sodium and refined carbohydrates 2. The pt will consume small frequent meals across the day 3. Identify food quantities necessary to achieve wt loss of  -2# per week to a goal wt loss of 2.7-10.9 kg (6-24 lb) at graduation from pulmonary rehab. 4. Describe the benefit of including fruits, vegetables, whole grains, and low-fat dairy products in a healthy meal plan. 5. Pt able to name foods that affect blood glucose   Plan:  Pt to attend Pulmonary  Nutrition class Will provide client-centered nutrition education as part of interdisciplinary care.    Monitor and Evaluate progress toward nutrition goal with team.    [place], MS, RD, LDN 11/23/2018 11:42 AM 

## 2018-11-26 NOTE — Telephone Encounter (Signed)
Pt is scheduled 12/12/18 for NCS.

## 2018-11-27 ENCOUNTER — Encounter (HOSPITAL_COMMUNITY): Payer: 59

## 2018-11-29 ENCOUNTER — Encounter (HOSPITAL_COMMUNITY)
Admission: RE | Admit: 2018-11-29 | Discharge: 2018-11-29 | Disposition: A | Discharge status: Home / Self Care | Payer: 59 | Source: Ambulatory Visit | Attending: Pulmonary Disease | Admitting: Pulmonary Disease

## 2018-11-29 DIAGNOSIS — Z79899 Other long term (current) drug therapy: Secondary | ICD-10-CM | POA: Diagnosis not present

## 2018-11-29 DIAGNOSIS — E785 Hyperlipidemia, unspecified: Secondary | ICD-10-CM | POA: Diagnosis not present

## 2018-11-29 DIAGNOSIS — E119 Type 2 diabetes mellitus without complications: Secondary | ICD-10-CM | POA: Insufficient documentation

## 2018-11-29 DIAGNOSIS — Z794 Long term (current) use of insulin: Secondary | ICD-10-CM | POA: Insufficient documentation

## 2018-11-29 DIAGNOSIS — I10 Essential (primary) hypertension: Secondary | ICD-10-CM | POA: Diagnosis not present

## 2018-11-29 DIAGNOSIS — D869 Sarcoidosis, unspecified: Secondary | ICD-10-CM | POA: Insufficient documentation

## 2018-11-29 NOTE — Progress Notes (Signed)
Daily Session Note  Patient Details  Name: Bethany Webb MRN: [id number] Date of Birth: 04/10/1961 Referring Provider:     Pulmonary Rehab Walk Test from 11/01/2018 in [facility]  Referring Provider  Dr. Yacoub      Encounter Date: 11/29/2018  Check In: Session Check In - 11/29/18 1223      Check-In   Supervising physician immediately available to respond to emergencies  [facility] immediately available    Physician(s)  Dr. Chui    Staff Present  Candra Wegner, RN, BSN;Carlette Carlton, RN, BSN;Dalton Fletcher, MS, Exercise Physiologist;Lisa Hughes, RN    Medication changes reported      No    Fall or balance concerns reported     No    Tobacco Cessation  No Change    Warm-up and Cool-down  Performed as group-led instruction    Resistance Training Performed  Yes    VAD Patient?  No    PAD/SET Patient?  No      Pain Assessment   Currently in Pain?  No/denies    Pain Score  0-No pain    Multiple Pain Sites  No       Capillary Blood Glucose: No results found for this or any previous visit (from the past 24 hour(s)).  Exercise Prescription Changes - 11/29/18 1200      Home Exercise Plan   Plans to continue exercise at  Home (comment)    Frequency  Add 3 additional days to program exercise sessions.    Initial Home Exercises Provided  11/29/18       Social History   Tobacco Use  Smoking Status Never Smoker  Smokeless Tobacco Never Used    Goals Met:  Proper associated with RPD/PD & O2 Sat Exercise tolerated well  Goals Unmet:  Not Applicable  Comments: Service time is from 1030 to 1210   Dr. Wesam G. Yacoub is Medical Director for Pulmonary Rehab at [facility]. 

## 2018-11-29 NOTE — Progress Notes (Signed)
Daily Session Note  Patient Details  Name: Bethany Webb MRN: [id number] Date of Birth: [birth year] Referring Provider:     Pulmonary Rehab Walk Test from 11/01/2018 in [place]  Referring Provider  Dr. Nelda Marseille      Encounter Date: 11/29/2018  Check In: Session Check In - 11/29/18 1223      Check-In   Supervising physician immediately available to respond to emergencies  [facility] immediately available    Physician(s)  Dr. Earlie Counts    Staff Present  Rosebud Poles, RN, BSN;Carlette Wilber Oliphant, RN, Bjorn Loser, MS, Exercise Physiologist;Lisa Ysidro Evert, RN    Medication changes reported      No    Fall or balance concerns reported     No    Tobacco Cessation  No Change    Warm-up and Cool-down  Performed as group-led instruction    Resistance Training Performed  Yes    VAD Patient?  No    PAD/SET Patient?  No      Pain Assessment   Currently in Pain?  No/denies    Pain Score  0-No pain    Multiple Pain Sites  No       Capillary Blood Glucose: No results found for this or any previous visit (from the past 24 hour(s)).  Exercise Prescription Changes - 11/29/18 1200      Home Exercise Plan   Plans to continue exercise at  Home (comment)    Frequency  Add 3 additional days to program exercise sessions.    Initial Home Exercises Provided  11/29/18       Social History   Tobacco Use  Smoking Status Never Smoker  Smokeless Tobacco Never Used    Goals Met:  Proper associated with RPD/PD & O2 Sat Exercise tolerated well  Goals Unmet:  Not Applicable  Comments: Service time is from 1030 to [place]   Dr. Rush Farmer is Medical Director for Pulmonary Rehab at [facility].

## 2018-11-29 NOTE — Progress Notes (Signed)
I have reviewed a Home Exercise Prescription with Bethany Webb . Bethany Webb is  currently exercising at home.  The patient was advised to walk and do home exercise videos 2-3 days a week for 30 minutes.  Bethany Webb and I discussed how to progress their exercise prescription.  The patient stated that their goals were to lose weight.  The patient stated that they understand the exercise prescription.  We reviewed exercise guidelines, target heart rate during exercise, RPE Scale, weather conditions, NTG use, endpoints for exercise, warmup and cool down.  Patient is encouraged to come to me with any questions. I will continue to follow up with the patient to assist them with progression and safety.

## 2018-12-04 ENCOUNTER — Encounter (HOSPITAL_COMMUNITY)
Admission: RE | Admit: 2018-12-04 | Discharge: 2018-12-04 | Disposition: A | Discharge status: Home / Self Care | Payer: 59 | Source: Ambulatory Visit | Attending: Pulmonary Disease | Admitting: Pulmonary Disease

## 2018-12-04 VITALS — Wt 162.5 lb

## 2018-12-04 DIAGNOSIS — D869 Sarcoidosis, unspecified: Secondary | ICD-10-CM

## 2018-12-04 NOTE — Progress Notes (Signed)
Daily Session Note  Patient Details  Name: Bethany Webb MRN: [id number] Date of Birth: 13-Jul-1961 Referring Provider:     Pulmonary Rehab Walk Test from 11/01/2018 in [place]  Referring Provider  Dr. Nelda Marseille      Encounter Date: 12/04/2018  Check In: Session Check In - 12/04/18 0958      Check-In   Supervising physician immediately available to respond to emergencies  [facility] immediately available    Physician(s)  Dr. Wyline Copas    Location  [facility]    Staff Present  Rosebud Poles, RN, BSN;Carlette Wilber Oliphant, RN, Bjorn Loser, MS, Exercise Physiologist;Ramsha Lonigro Ysidro Evert, Felipe Drone, RN, MHA    Medication changes reported      No    Fall or balance concerns reported     No    Tobacco Cessation  No Change    Warm-up and Cool-down  Performed as group-led instruction    Resistance Training Performed  Yes    VAD Patient?  No    PAD/SET Patient?  No      Pain Assessment   Currently in Pain?  No/denies    Pain Score  0-No pain    Multiple Pain Sites  No       Capillary Blood Glucose: No results found for this or any previous visit (from the past 24 hour(s)). POCT Glucose - 12/04/18 1229      POCT Blood Glucose   Pre-Exercise  134 mg/dL    Post-Exercise  125 mg/dL      Exercise Prescription Changes - 12/04/18 1200      Response to Exercise   Blood Pressure (Admit)  142/80    Blood Pressure (Exercise)  144/80    Blood Pressure (Exit)  136/70    Heart Rate (Admit)  99 bpm    Heart Rate (Exercise)  105 bpm    Heart Rate (Exit)  87 bpm    Oxygen Saturation (Admit)  98 %    Oxygen Saturation (Exercise)  97 %    Oxygen Saturation (Exit)  99 %    Rating of Perceived Exertion (Exercise)  9    Perceived Dyspnea (Exercise)  0    Duration  Continue with 45 min of aerobic exercise without signs/symptoms of physical distress.    Intensity  THRR unchanged      Progression   Progression  Continue to progress  workloads to maintain intensity without signs/symptoms of physical distress.      Resistance Training   Training Prescription  Yes    Weight  orange bands    Reps  10-15    Time  10 Minutes      Treadmill   MPH  2    Grade  2    Minutes  17      Bike   Level  0.6    Minutes  17      NuStep   Level  4    SPM  80    Minutes  1.9       Social History   Tobacco Use  Smoking Status Never Smoker  Smokeless Tobacco Never Used    Goals Met:  Exercise tolerated well No report of cardiac concerns or symptoms Strength training completed today  Goals Unmet:  Not Applicable  Comments: Service time is from 1030 to 1205    Dr. Rush Farmer is Medical Director for Pulmonary Rehab at [facility].

## 2018-12-05 ENCOUNTER — Encounter (HOSPITAL_COMMUNITY): Payer: Self-pay

## 2018-12-05 NOTE — Progress Notes (Signed)
Pulmonary Individual Treatment Plan  Patient Details  Name: Bethany Webb MRN: [id number] Date of Birth: 1961/03/13 Referring Provider:     Pulmonary Rehab Walk Test from 11/01/2018 in [place]  Referring Provider  Dr. Nelda Marseille      Initial Encounter Date:    Pulmonary Rehab Walk Test from 11/01/2018 in [facility]  Date  11/01/18      Visit Diagnosis: Sarcoidosis  Patient's Home Medications on Admission:   Current Outpatient Medications:  .  amLODipine (NORVASC) 10 MG tablet, Take 1 tablet (10 mg total) by mouth daily., Disp: 90 tablet, Rfl: 3 .  amLODipine (NORVASC) 10 MG tablet, TAKE 1 TABLET BY MOUTH EVERY DAY, Disp: 90 tablet, Rfl: 0 .  azaTHIOprine (IMURAN) 50 MG tablet, Take 50 mg by mouth daily., Disp: , Rfl:  .  BAYER CONTOUR TEST test strip, TEST BLOOD SUGAR TWICE DAILY, Disp: 100 each, Rfl: 0 .  blood glucose meter kit and supplies KIT, Dispense based on patient and insurance preference. Use up to four times daily as directed. (FOR ICD-9 250.00, 250.01)., Disp: 1 each, Rfl: 0 .  cholecalciferol (VITAMIN D) 1000 units tablet, Take 1 tablet (1,000 Units total) by mouth daily., Disp: 90 tablet, Rfl: 3 .  glucose blood (ONE TOUCH ULTRA TEST) test strip, USE AS DIRECTED, Disp: 100 each, Rfl: 2 .  Insulin Lispro Prot & Lispro (HUMALOG MIX 75/25 KWIKPEN) (75-25) 100 UNIT/ML Kwikpen, Inject 5 Units into the skin 3 (three) times daily., Disp: 15 mL, Rfl: 3 .  Insulin Pen Needle (BD PEN NEEDLE NANO U/F) 32G X 4 MM MISC, INJECT THREE TIMES DAILY AS DIRECTED, Disp: 100 each, Rfl: 11 .  Lancets 30G MISC, Test blood sugars twice daily, Disp: 100 each, Rfl: 2 .  lisinopril-hydrochlorothiazide (PRINZIDE,ZESTORETIC) 20-25 MG tablet, Take 1 tablet by mouth daily., Disp: 90 tablet, Rfl: 3 .  metFORMIN (GLUCOPHAGE-XR) 500 MG 24 hr tablet, Take 1 tablet (500 mg total) by mouth daily with breakfast., Disp: 30 tablet, Rfl: 2 .   methylPREDNISolone (MEDROL DOSEPAK) 4 MG TBPK tablet, Take as directed, Disp: 21 tablet, Rfl: 0 .  ondansetron (ZOFRAN) 4 MG tablet, Take 1 tablet (4 mg total) by mouth every 6 (six) hours as needed for nausea., Disp: 20 tablet, Rfl: 0 .  PEPPERMINT OIL PO, Take 1 drop by mouth See admin instructions. Place 1 drop into 12-16 ounces of water and drink as needed for nausea or headaches, Disp: , Rfl:   Past Medical History: Past Medical History:  Diagnosis Date  . 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 Biceps tendonitis 2/12   Dr. Gladstone Lighter  . History of blood transfusion 1988   "related to hysterectomy" (09/27/2018)  . History of mammogram 05/25/12   stable, no suspicious finding, extremely dense breast tissue  . Hyperlipidemia   . Hypertension   . Neutropenia, unspecified ([place]) 02/2013   slight, likely ethnicity related  . Sarcoidosis    "lung, liver, eyes, skin"   . Seasonal allergies   . Shortness of breath dyspnea    with exertion   . Supraspinatus tendonitis 2/12   Dr. Gladstone Lighter  . Type II diabetes mellitus ([place])   . Wears glasses     Tobacco Use: Social History   Tobacco Use  Smoking Status Never Smoker  Smokeless Tobacco Never Used    Labs: Recent Review Scientist, physiological  Labs for ITP Cardiac and Pulmonary Rehab Latest Ref Rng & Units 09/02/2016 10/14/2016 02/16/2017 01/04/2018 08/31/2018   Cholestrol <200 mg/dL 250(H) - 219(H) 214(H) -   LDLCALC mg/dL (calc) 175(H) - 149(H) 102(H) -   HDL >50 mg/dL 60.60 - 61.30 98 -   Trlycerides <150 mg/dL 72.0 - 44.0 49 -   Hemoglobin A1c 4.0 - 5.6 % - 7.1(H) 6.5 5.8(H) 6.6(A)      Capillary Blood Glucose: Lab Results  Component Value Date   GLUCAP 109 (H) 09/29/2018   GLUCAP 139 (H) 09/28/2018   GLUCAP 101 (H) 09/28/2018   GLUCAP 84 09/28/2018   GLUCAP 82 09/28/2018   POCT Glucose    Row Name 12/04/18 1229             POCT Blood Glucose    Pre-Exercise  134 mg/dL       Post-Exercise  125 mg/dL          Pulmonary Assessment Scores: Pulmonary Assessment Scores    Row Name 10/30/18 1633 11/01/18 1626       ADL UCSD   ADL Phase  Entry  Entry    SOB Score total  40  -      CAT Score   CAT Score  8 pre  -      mMRC Score   mMRC Score  -  1       Pulmonary Function Assessment:   Exercise Target Goals: Exercise Program Goal: Individual exercise prescription set using results from initial 6 min walk test and THRR while considering  patient's activity barriers and safety.   Exercise Prescription Goal: Initial exercise prescription builds to 30-45 minutes a day of aerobic activity, 2-3 days per week.  Home exercise guidelines will be given to patient during program as part of exercise prescription that the participant will acknowledge.  Activity Barriers & Risk Stratification: Activity Barriers & Cardiac Risk Stratification - 10/26/18 0934      Activity Barriers & Cardiac Risk Stratification   Activity Barriers  Arthritis    Cardiac Risk Stratification  Low       6 Minute Walk: 6 Minute Walk    Row Name 11/01/18 1627         6 Minute Walk   Phase  Initial     Distance  1622 feet     Walk Time  6 minutes     # of Rest Breaks  0     MPH  3.07     METS  3.37     RPE  11     Perceived Dyspnea   1     Symptoms  No     Resting HR  84 bpm     Resting BP  128/70     Resting Oxygen Saturation   99 %     Exercise Oxygen Saturation  during 6 min walk  94 %     Max Ex. HR  127 bpm     Max Ex. BP  134/68     2 Minute Post BP  118/72       Interval HR   1 Minute HR  117     2 Minute HR  118     3 Minute HR  115     4 Minute HR  121     5 Minute HR  127     6 Minute HR  122     2 Minute Post HR  89  Interval Heart Rate?  Yes       Interval Oxygen   Interval Oxygen?  Yes     Baseline Oxygen Saturation %  99 %     1 Minute Oxygen Saturation %  98 %     1 Minute Liters of Oxygen  0 L     2 Minute  Oxygen Saturation %  95 %     2 Minute Liters of Oxygen  0 L     3 Minute Oxygen Saturation %  97 %     3 Minute Liters of Oxygen  0 L     4 Minute Oxygen Saturation %  99 %     4 Minute Liters of Oxygen  0 L     5 Minute Oxygen Saturation %  100 %     5 Minute Liters of Oxygen  0 L     6 Minute Oxygen Saturation %  94 %     6 Minute Liters of Oxygen  0 L     2 Minute Post Oxygen Saturation %  100 %     2 Minute Post Liters of Oxygen  0 L        Oxygen Initial Assessment: Oxygen Initial Assessment - 11/01/18 1626      Home Oxygen   Home Oxygen Device  None    Sleep Oxygen Prescription  None    Home Exercise Oxygen Prescription  None    Home at Rest Exercise Oxygen Prescription  None      Initial 6 min Walk   Oxygen Used  None      Program Oxygen Prescription   Program Oxygen Prescription  None       Oxygen Re-Evaluation: Oxygen Re-Evaluation    Row Name 12/04/18 1608             Program Oxygen Prescription   Program Oxygen Prescription  None         Home Oxygen   Home Oxygen Device  None       Sleep Oxygen Prescription  None       Home Exercise Oxygen Prescription  None       Home at Rest Exercise Oxygen Prescription  None         Goals/Expected Outcomes   Short Term Goals  To learn and exhibit compliance with exercise, home and travel O2 prescription;To learn and understand importance of maintaining oxygen saturations>88%;To learn and understand importance of monitoring SPO2 with pulse oximeter and demonstrate accurate use of the pulse oximeter.;To learn and demonstrate proper pursed lip breathing techniques or other breathing techniques.       Long  Term Goals  Exhibits compliance with exercise, home and travel O2 prescription;Maintenance of O2 saturations>88%;Verbalizes importance of monitoring SPO2 with pulse oximeter and return demonstration;Exhibits proper breathing techniques, such as pursed lip breathing or other method taught during program session        Goals/Expected Outcomes  compliance           Oxygen Discharge (Final Oxygen Re-Evaluation): Oxygen Re-Evaluation - 12/04/18 1608      Program Oxygen Prescription   Program Oxygen Prescription  None      Home Oxygen   Home Oxygen Device  None    Sleep Oxygen Prescription  None    Home Exercise Oxygen Prescription  None    Home at Rest Exercise Oxygen Prescription  None      Goals/Expected Outcomes   Short Term Goals  To learn and  exhibit compliance with exercise, home and travel O2 prescription;To learn and understand importance of maintaining oxygen saturations>88%;To learn and understand importance of monitoring SPO2 with pulse oximeter and demonstrate accurate use of the pulse oximeter.;To learn and demonstrate proper pursed lip breathing techniques or other breathing techniques.    Long  Term Goals  Exhibits compliance with exercise, home and travel O2 prescription;Maintenance of O2 saturations>88%;Verbalizes importance of monitoring SPO2 with pulse oximeter and return demonstration;Exhibits proper breathing techniques, such as pursed lip breathing or other method taught during program session    Goals/Expected Outcomes  compliance        Initial Exercise Prescription: Initial Exercise Prescription - 11/01/18 1600      Date of Initial Exercise RX and Referring Provider   Date  11/01/18    Referring Provider  Dr. Nelda Marseille      Treadmill   MPH  1.7    Grade  0    Minutes  17      Bike   Level  0.4    Minutes  17      NuStep   Level  3    SPM  80    Minutes  17      Prescription Details   Frequency (times per week)  2    Duration  Progress to 45 minutes of aerobic exercise without signs/symptoms of physical distress      Intensity   THRR 40-80% of Max Heartrate  64-129    Ratings of Perceived Exertion  11-13    Perceived Dyspnea  0-4      Progression   Progression  Continue to progress workloads to maintain intensity without signs/symptoms of physical distress.       Resistance Training   Training Prescription  Yes    Weight  orange bands    Reps  10-15       Perform Capillary Blood Glucose checks as needed.  Exercise Prescription Changes:  Exercise Prescription Changes    Row Name 11/15/18 1545 11/29/18 1200 12/04/18 1200         Response to Exercise   Blood Pressure (Admit)  150/80  -  142/80     Blood Pressure (Exercise)  144/90  -  144/80     Blood Pressure (Exit)  108/78  -  136/70     Heart Rate (Admit)  97 bpm  -  99 bpm     Heart Rate (Exercise)  110 bpm  -  105 bpm     Heart Rate (Exit)  99 bpm  -  87 bpm     Oxygen Saturation (Admit)  98 %  -  98 %     Oxygen Saturation (Exercise)  96 %  -  97 %     Oxygen Saturation (Exit)  100 %  -  99 %     Rating of Perceived Exertion (Exercise)  9  -  9     Perceived Dyspnea (Exercise)  0  -  0     Duration  Progress to 45 minutes of aerobic exercise without signs/symptoms of physical distress  -  Continue with 45 min of aerobic exercise without signs/symptoms of physical distress.     Intensity  - 40-80% hrr  -  THRR unchanged       Progression   Progression  -  -  Continue to progress workloads to maintain intensity without signs/symptoms of physical distress.       Horticulturist, commercial Prescription  Yes  -  Yes     Weight  orange bands  -  orange bands     Reps  10-15  -  10-15     Time  10 Minutes  -  10 Minutes       Interval Training   Interval Training  No  -  -       Treadmill   MPH  -  -  2     Grade  -  -  2     Minutes  -  -  17       Bike   Level  0.4  -  0.6     Minutes  17  -  17       NuStep   Level  3  -  4     SPM  80  -  80     Minutes  2.5  -  1.9       Home Exercise Plan   Plans to continue exercise at  -  Home (comment)  -     Frequency  -  Add 3 additional days to program exercise sessions.  -     Initial Home Exercises Provided  -  11/29/18  -        Exercise Comments:  Exercise Comments    Row Name 11/29/18 1215            Exercise Comments  home exercise complete          Exercise Goals and Review:  Exercise Goals    Row Name 10/26/18 0940             Exercise Goals   Increase Physical Activity  Yes       Intervention  Provide advice, education, support and counseling about physical activity/exercise needs.;Develop an individualized exercise prescription for aerobic and resistive training based on initial evaluation findings, risk stratification, comorbidities and participant's personal goals.       Expected Outcomes  Short Term: Attend rehab on a regular basis to increase amount of physical activity.;Long Term: Exercising regularly at least 3-5 days a week.;Long Term: Add in home exercise to make exercise part of routine and to increase amount of physical activity.       Increase Strength and Stamina  Yes       Intervention  Provide advice, education, support and counseling about physical activity/exercise needs.;Develop an individualized exercise prescription for aerobic and resistive training based on initial evaluation findings, risk stratification, comorbidities and participant's personal goals.       Expected Outcomes  Short Term: Increase workloads from initial exercise prescription for resistance, speed, and METs.;Short Term: Perform resistance training exercises routinely during rehab and add in resistance training at home;Long Term: Improve cardiorespiratory fitness, muscular endurance and strength as measured by increased METs and functional capacity (6MWT)       Able to understand and use rate of perceived exertion (RPE) scale  Yes       Intervention  Provide education and explanation on how to use RPE scale       Expected Outcomes  Short Term: Able to use RPE daily in rehab to express subjective intensity level;Long Term:  Able to use RPE to guide intensity level when exercising independently       Able to understand and use Dyspnea scale  Yes       Intervention  Provide education and  explanation on how to use Dyspnea scale  Expected Outcomes  Short Term: Able to use Dyspnea scale daily in rehab to express subjective sense of shortness of breath during exertion;Long Term: Able to use Dyspnea scale to guide intensity level when exercising independently       Knowledge and understanding of Target Heart Rate Range (THRR)  Yes       Intervention  Provide education and explanation of THRR including how the numbers were predicted and where they are located for reference       Expected Outcomes  Short Term: Able to state/look up THRR;Long Term: Able to use THRR to govern intensity when exercising independently;Short Term: Able to use daily as guideline for intensity in rehab       Understanding of Exercise Prescription  Yes       Intervention  Provide education, explanation, and written materials on patient's individual exercise prescription       Expected Outcomes  Short Term: Able to explain program exercise prescription;Long Term: Able to explain home exercise prescription to exercise independently          Exercise Goals Re-Evaluation : Exercise Goals Re-Evaluation    [place] Name 12/04/18 1608             Exercise Goal Re-Evaluation   Exercise Goals Review  Increase Physical Activity;Increase Strength and Stamina;Able to understand and use rate of perceived exertion (RPE) scale;Able to understand and use Dyspnea scale;Knowledge and understanding of Target Heart Rate Range (THRR);Understanding of Exercise Prescription       Comments  Pt has completed 5 visits so far. She is open to workload increases and has progressed well so far. She exercise on the treadmill at a moderate MET level of 3.08. Will continue to monitor and progress as able.        Expected Outcomes  Through exercise at rehab and at home, the patient will decrease shortness of breath with daily activities and feel confident in carrying out an exercise regime at home.           Discharge Exercise Prescription  (Final Exercise Prescription Changes): Exercise Prescription Changes - 12/04/18 1200      Response to Exercise   Blood Pressure (Admit)  142/80    Blood Pressure (Exercise)  144/80    Blood Pressure (Exit)  136/70    Heart Rate (Admit)  99 bpm    Heart Rate (Exercise)  105 bpm    Heart Rate (Exit)  87 bpm    Oxygen Saturation (Admit)  98 %    Oxygen Saturation (Exercise)  97 %    Oxygen Saturation (Exit)  99 %    Rating of Perceived Exertion (Exercise)  9    Perceived Dyspnea (Exercise)  0    Duration  Continue with 45 min of aerobic exercise without signs/symptoms of physical distress.    Intensity  THRR unchanged      Progression   Progression  Continue to progress workloads to maintain intensity without signs/symptoms of physical distress.      Resistance Training   Training Prescription  Yes    Weight  orange bands    Reps  10-15    Time  10 Minutes      Treadmill   MPH  2    Grade  2    Minutes  17      Bike   Level  0.6    Minutes  17      NuStep   Level  4    SPM  61    Minutes  1.9       Nutrition:  Target Goals: Understanding of nutrition guidelines, daily intake of sodium <1582m, cholesterol <2021m calories 30% from fat and 7% or less from saturated fats, daily to have 5 or more servings of fruits and vegetables.  Biometrics:    Nutrition Therapy Plan and Nutrition Goals: Nutrition Therapy & Goals - 11/23/18 1144      Nutrition Therapy   Diet  diabetic      Personal Nutrition Goals   Nutrition Goal  Pt to identify and limit food sources of sodium and refined carbohydrates    Personal Goal #2  The pt will consume small frequent meals across the day    Personal Goal #3  Identify food quantities necessary to achieve wt loss of  -2# per week to a goal wt loss of 2.7-10.9 kg (6-24 lb) at graduation from pulmonary rehab.    Personal Goal #4  Pt able to name foods that affect blood glucose       Intervention Plan   Intervention  Prescribe, educate  and counsel regarding individualized specific dietary modifications aiming towards targeted core components such as weight, hypertension, lipid management, diabetes, heart failure and other comorbidities.    Expected Outcomes  Short Term Goal: Understand basic principles of dietary content, such as calories, fat, sodium, cholesterol and nutrients.;Long Term Goal: Adherence to prescribed nutrition plan.       Nutrition Assessments: Nutrition Assessments - 11/23/18 1152      Rate Your Plate Scores   Pre Score  38       Nutrition Goals Re-Evaluation: Nutrition Goals Re-Evaluation    Row Name 11/23/18 1144             Goals   Current Weight  162 lb 11.2 oz (73.8 kg)          Nutrition Goals Discharge (Final Nutrition Goals Re-Evaluation): Nutrition Goals Re-Evaluation - 11/23/18 1144      Goals   Current Weight  162 lb 11.2 oz (73.8 kg)       Psychosocial: Target Goals: Acknowledge presence or absence of significant depression and/or stress, maximize coping skills, provide positive support system. Participant is able to verbalize types and ability to use techniques and skills needed for reducing stress and depression.  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      Screening Interventions   Interventions  Encouraged to exercise    Expected Outcomes  Long Term Goal: Stressors or current issues are controlled or eliminated.;Short Term goal: Identification and review with participant of any Quality of Life or Depression  concerns found by scoring the questionnaire.;Long Term goal: The participant improves quality of Life and PHQ9 Scores as seen by post scores and/or verbalization of changes       Quality of Life Scores:  Scores of 19 and below usually indicate a poorer quality of life in these areas.  A difference of  2-3 points is a clinically meaningful difference.  A difference of 2-3 points in the total score of the Quality of Life Index has been associated with significant improvement in overall quality of life, self-image, physical symptoms, and general health in studies assessing change in quality of life.  PHQ-9: Recent Review Flowsheet Data    Depression screen [facility] 2/9 10/26/2018 10/24/2018 07/21/2017   Decreased Interest 1 1 0   Down, Depressed, Hopeless 1 1 0   PHQ - 2 Score 2 2 0   Altered sleeping 0 0 -   Tired, decreased energy 1 0 -   Change in appetite 0 0 -   Feeling bad or failure about yourself  0 0 -   Trouble concentrating 1 1 -   Moving slowly or fidgety/restless 0 0 -   Suicidal thoughts 0 0 -   PHQ-9 Score 4 3 -   Difficult doing work/chores Not difficult at all - -     Interpretation of Total Score  Total Score Depression Severity:  1-4 = Minimal depression, 5-9 = Mild depression, 10-14 = Moderate depression, 15-19 = Moderately severe depression, 20-27 = Severe depression   Psychosocial Evaluation and Intervention: Psychosocial Evaluation - 12/05/18 1629      Psychosocial Evaluation & Interventions   Interventions  Stress management education;Encouraged to exercise with the program and follow exercise prescription;Relaxation education    Comments  Pt has set a retirement date for next year.  This has decreased her stress level at work.  Pt can see the "light at the end of the tunnel".    Expected Outcomes  Pt will develop and utilize effective and positive coping skills.    Continue Psychosocial Services   Follow up required by staff       Psychosocial  Re-Evaluation: Psychosocial Re-Evaluation    [place] Name 12/05/18 1630             Psychosocial Re-Evaluation   Current issues with  Current Stress Concerns       Comments  Pt finds her job stressful, pt plans to retire next year.       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 Participant will state understanding/return demonstration of topics presented.  Learning Barriers/Preferences: Learning Barriers/Preferences - 10/26/18  1004      Learning Barriers/Preferences   Learning Barriers  Sight    Learning Preferences  Written Material;Computer/Internet;Group Instruction;Individual Instruction       Education Topics: Risk Factor Reduction:  -Group instruction that is supported by a PowerPoint presentation. Instructor discusses the definition of a risk factor, different risk factors for  pulmonary disease, and how the heart and lungs work together.     Nutrition for Pulmonary Patient:  -Group instruction provided by PowerPoint slides, verbal discussion, and written materials to support subject matter. The instructor gives an explanation and review of healthy diet recommendations, which includes a discussion on weight management, recommendations for fruit and vegetable consumption, as well as protein, fluid, caffeine, fiber, sodium, sugar, and alcohol. Tips for eating when patients are short of breath are discussed.   Pursed Lip Breathing:  -Group instruction that is supported by demonstration and informational handouts. Instructor discusses the benefits of pursed lip and diaphragmatic breathing and detailed demonstration on how to preform both.     Oxygen Safety:  -Group instruction provided by PowerPoint, verbal discussion, and written material to support subject matter. There is an overview of "What is Oxygen" and "Why do we need it".  Instructor also reviews how to create a safe environment for oxygen use, the importance of using oxygen as prescribed, and the risks of noncompliance. There is a brief discussion on traveling with oxygen and resources the patient may utilize.   Oxygen Equipment:  -Group instruction provided by [facility] Staff utilizing handouts, written materials, and equipment demonstrations.   Signs and Symptoms:  -Group instruction provided by written material and verbal discussion to support subject matter. Warning signs and symptoms of infection, stroke, and heart attack are reviewed and when to call the physician/911 reinforced. Tips for preventing the spread of infection discussed.   Advanced Directives:  -Group instruction provided by verbal instruction and written material to support subject matter. Instructor reviews Advanced Directive laws and proper instruction for filling out document.   Pulmonary Video:  -Group video education that reviews  the importance of medication and oxygen compliance, exercise, good nutrition, pulmonary hygiene, and pursed lip and diaphragmatic breathing for the pulmonary patient.   Exercise for the Pulmonary Patient:  -Group instruction that is supported by a PowerPoint presentation. Instructor discusses benefits of exercise, core components of exercise, frequency, duration, and intensity of an exercise routine, importance of utilizing pulse oximetry during exercise, safety while exercising, and options of places to exercise outside of rehab.     Pulmonary Medications:  -Verbally interactive group education provided by instructor with focus on inhaled medications and proper administration.   Anatomy and Physiology of the Respiratory System and Intimacy:  -Group instruction provided by PowerPoint, verbal discussion, and written material to support subject matter. Instructor reviews respiratory cycle and anatomical components of the respiratory system and their functions. Instructor also reviews differences in obstructive and restrictive respiratory diseases with examples of each. Intimacy, Sex, and Sexuality differences are reviewed with a discussion on how relationships can change when diagnosed with pulmonary disease. Common sexual concerns are reviewed.   MD DAY -A group question and answer session with a medical doctor that allows participants to ask questions that relate to their pulmonary disease state.   OTHER EDUCATION -Group or individual verbal, written, or video instructions that support the educational goals of the pulmonary rehab program.   PULMONARY REHAB OTHER RESPIRATORY from 11/15/2018 in [place]  Date  -- [Thankfullness]  Educator    Instruction Review Code  1- Verbalizes Understanding      Holiday Eating Survival Tips:  -Group instruction provided by PowerPoint slides, verbal discussion, and written materials to support subject matter. The  instructor gives patients tips, tricks, and techniques to help them not only survive but enjoy the holidays despite the onslaught of food that accompanies the holidays.   PULMONARY REHAB OTHER RESPIRATORY from 11/15/2018 in [place]  Date  11/08/18  Educator  -- [RD]  Instruction Review Code  1- Verbalizes Understanding      Knowledge Questionnaire Score: Knowledge Questionnaire Score - 10/30/18 1634      Knowledge Questionnaire Score   Pre Score  12/18       Core Components/Risk Factors/Patient Goals at Admission: Personal Goals and Risk Factors at Admission - 10/26/18 1006      Core Components/Risk Factors/Patient Goals on Admission    Weight Management  Yes;Weight Loss    Intervention  Obesity: Provide education and appropriate resources to help participant work on and attain dietary goals.;Weight Management/Obesity: Establish reasonable short term and long term weight goals.;Weight Management: Provide education and appropriate resources to help participant work on and attain dietary goals.;Weight Management: Develop a combined nutrition and exercise program designed to reach desired caloric intake, while maintaining appropriate intake of nutrient and fiber, sodium and fats, and appropriate energy expenditure required for the weight goal.    Admit Weight  158 lb 15.2 oz (72.1 kg)    Goal Weight: Short Term  150 lb (68 kg)    Goal Weight: Long Term  140 lb (63.5 kg)    Expected Outcomes  Weight Loss: Understanding of general recommendations for a balanced deficit meal plan, which promotes 1-2 lb weight loss per week and includes a negative energy balance of [phone] kcal/d;Long Term: Adherence to nutrition and physical activity/exercise program aimed toward attainment of established weight goal;Short Term: Continue to assess and modify interventions until short term weight is achieved;Understanding recommendations for meals to include 15-35% energy as  protein, 25-35% energy from fat, 35-60% energy from carbohydrates, less than 257m of dietary cholesterol, 20-35 gm of total fiber daily;Understanding of distribution of calorie intake throughout the day with the consumption of 4-5 meals/snacks    Diabetes  Yes    Intervention  Provide education about signs/symptoms and action to take for hypo/hyperglycemia.;Provide education about proper nutrition, including hydration, and aerobic/resistive exercise prescription along with prescribed medications to achieve blood glucose in normal ranges: Fasting glucose 65-99 mg/dL    Expected Outcomes  Short Term: Participant verbalizes understanding of the signs/symptoms and immediate care of hyper/hypoglycemia, proper foot care and importance of medication, aerobic/resistive exercise and nutrition plan for blood glucose control.;Long Term: Attainment of HbA1C < 7%.    Hypertension  Yes    Intervention  Provide education on lifestyle modifcations including regular physical activity/exercise, weight management, moderate sodium restriction and increased consumption of fresh fruit, vegetables, and low fat dairy, alcohol moderation, and smoking cessation.;Monitor prescription use compliance.    Expected Outcomes  Long Term: Maintenance of blood pressure at goal levels.;Short Term: Continued assessment and intervention until BP is < 140/972mHG in hypertensive participants. < 130/8019mG in hypertensive participants with diabetes, heart failure or chronic kidney disease.    Lipids  Yes    Intervention  Provide education and support for participant on nutrition & aerobic/resistive exercise along with prescribed medications to achieve LDL <3m20mDL >40mg62m Expected Outcomes  Short Term: Participant  states understanding of desired cholesterol values and is compliant with medications prescribed. Participant is following exercise prescription and nutrition guidelines.;Long Term: Cholesterol controlled with medications as  prescribed, with individualized exercise RX and with personalized nutrition plan. Value goals: LDL < 58m, HDL > 40 mg.    Stress  Yes    Intervention  Offer individual and/or small group education and counseling on adjustment to heart disease, stress management and health-related lifestyle change. Teach and support self-help strategies.    Expected Outcomes  Short Term: Participant demonstrates changes in health-related behavior, relaxation and other stress management skills, ability to obtain effective social support, and compliance with psychotropic medications if prescribed.;Long Term: Emotional wellbeing is indicated by absence of clinically significant psychosocial distress or social isolation.       Core Components/Risk Factors/Patient Goals Review:  Goals and Risk Factor Review    Row Name 12/05/18 1633             Core Components/Risk Factors/Patient Goals Review   Personal Goals Review  Weight Management/Obesity;Increase knowledge of respiratory medications and ability to use respiratory devices properly.;Diabetes;Stress;Improve shortness of breath with ADL's;Develop more efficient breathing techniques such as purse lipped breathing and diaphragmatic breathing and practicing self-pacing with activity.;Hypertension       Review  Pt has completed 5 exercise sessions and 2 education classess. Pt with weight gain of 1.7 kg more than likely due to holiday eating.  Pt will meet with the dietician soon to discuss weight loss strategies.  Pt with improvement of her gout flare up due to steriods.   Pt sress level remains unchanged however her tolerance and reaciton has changed.  Pt has seleceted a retirement date and feels she can see the light at the end of the tunnel.   Pt workloads remain level .6 o the airydyne, increase on the treadmill to 2.0/2.0 and increase to level 4 on the nustep.  pt reports within acceptable limits and feels her lipid management is appropriate.  Will resolve this goal.   Pt brings her meter to check pre and post blood glucose.  Pt does not take her insulin prior to exercise and this has helped with hypoglycemic episodes.  Pt pre exercie bp remain slightly above goal hosever post exercise bp readings are acceptable.  Continue to monitor and may be able to resolve this goal on next reveiw.       Expected Outcomes  See Admission Goals and Outcomes          Core Components/Risk Factors/Patient Goals at Discharge (Final Review):  Goals and Risk Factor Review - 12/05/18 1633      Core Components/Risk Factors/Patient Goals Review   Personal Goals Review  Weight Management/Obesity;Increase knowledge of respiratory medications and ability to use respiratory devices properly.;Diabetes;Stress;Improve shortness of breath with ADL's;Develop more efficient breathing techniques such as purse lipped breathing and diaphragmatic breathing and practicing self-pacing with activity.;Hypertension    Review  Pt has completed 5 exercise sessions and 2 education classess. Pt with weight gain of 1.7 kg more than likely due to holiday eating.  Pt will meet with the dietician soon to discuss weight loss strategies.  Pt with improvement of her gout flare up due to steriods.   Pt sress level remains unchanged however her tolerance and reaciton has changed.  Pt has seleceted a retirement date and feels she can see the light at the end of the tunnel.   Pt workloads remain level .6 o the airydyne, increase on the treadmill to 2.0/2.0  and increase to level 4 on the nustep.  pt reports within acceptable limits and feels her lipid management is appropriate.  Will resolve this goal.  Pt brings her meter to check pre and post blood glucose.  Pt does not take her insulin prior to exercise and this has helped with hypoglycemic episodes.  Pt pre exercie bp remain slightly above goal hosever post exercise bp readings are acceptable.  Continue to monitor and may be able to resolve this goal on next reveiw.     Expected Outcomes  See Admission Goals and Outcomes       ITP Comments: ITP Comments    Row Name 10/26/18 0933 12/05/18 1628         ITP Comments  Dr. Manfred Arch, Medical Director Pulmonary Rehab  Dr. Manfred Arch, Medical Director Pulmonary Rehab         Comments:  ITP REVIEW Pt is making expected progress toward personal goals after completing 5 sessions.   Recommend continued exercise, life style modification, education, and utilization of breathing texhniques to increase stamina and strength and decrease shortness of breath with exertion. Cherre Huger, BSN Cardiac and Pulmonary Rehab Nurse Navigator      Psychologist, clinical, BSN Cardiac and Training and development officer

## 2018-12-06 ENCOUNTER — Encounter (HOSPITAL_COMMUNITY)
Admission: RE | Admit: 2018-12-06 | Discharge: 2018-12-06 | Disposition: A | Discharge status: Home / Self Care | Payer: 59 | Source: Ambulatory Visit | Attending: Pulmonary Disease | Admitting: Pulmonary Disease

## 2018-12-06 DIAGNOSIS — D869 Sarcoidosis, unspecified: Secondary | ICD-10-CM

## 2018-12-06 NOTE — Progress Notes (Signed)
Daily Session Note  Patient Details  Name: Bethany Webb MRN: [id number] Date of Birth: [birth year] Referring Provider:     Pulmonary Rehab Walk Test from 11/01/2018 in [place]  Referring Provider  Dr. Nelda Marseille      Encounter Date: 12/06/2018  Check In: Session Check In - 12/06/18 1119      Check-In   Supervising physician immediately available to respond to emergencies  [facility] immediately available    Physician(s)  Dr. Lonny Prude    Location  [facility]    Staff Present  Rosebud Poles, RN, BSN;Carlette Wilber Oliphant, RN, Bjorn Loser, MS, Exercise Physiologist;Mahkai Fangman Ysidro Evert, RN    Medication changes reported      No    Fall or balance concerns reported     No    Tobacco Cessation  No Change    Warm-up and Cool-down  Performed as group-led instruction    Resistance Training Performed  Yes    VAD Patient?  No    PAD/SET Patient?  No      Pain Assessment   Currently in Pain?  No/denies    Pain Score  0-No pain    Multiple Pain Sites  No       Capillary Blood Glucose: No results found for this or any previous visit (from the past 24 hour(s)).    Social History   Tobacco Use  Smoking Status Never Smoker  Smokeless Tobacco Never Used    Goals Met:  Exercise tolerated well No report of cardiac concerns or symptoms Strength training completed today  Goals Unmet:  Not Applicable  Comments: Service time is from 1030 to 1215    Dr. Rush Farmer is Medical Director for Pulmonary Rehab at [facility].

## 2018-12-06 NOTE — Progress Notes (Signed)
Bethany Webb 57 y.o. female   DOB: October 06, 1961 MRN: [id number]          Nutrition 1. Sarcoidosis    Past Medical History:  Diagnosis Date  . 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 Biceps tendonitis 2/12   Dr. Gladstone Lighter  . History of blood transfusion 1988   "related to hysterectomy" (09/27/2018)  . History of mammogram 05/25/12   stable, no suspicious finding, extremely dense breast tissue  . Hyperlipidemia   . Hypertension   . Neutropenia, unspecified ([name]) 02/2013   slight, likely ethnicity related  . Sarcoidosis    "lung, liver, eyes, skin"   . Seasonal allergies   . Shortness of breath dyspnea    with exertion   . Supraspinatus tendonitis 2/12   Dr. Gladstone Lighter  . Type II diabetes mellitus ([name])   . Wears glasses      Meds reviewed.  Current Outpatient Medications (Endocrine & Metabolic):  Marland Kitchen  Insulin Lispro Prot & Lispro (HUMALOG MIX 75/25 KWIKPEN) (75-25) 100 UNIT/ML Kwikpen, Inject 5 Units into the skin 3 (three) times daily. .  metFORMIN (GLUCOPHAGE-XR) 500 MG 24 hr tablet, Take 1 tablet (500 mg total) by mouth daily with breakfast. .  methylPREDNISolone (MEDROL DOSEPAK) 4 MG TBPK tablet, Take as directed  Current Outpatient Medications (Cardiovascular):  .  amLODipine (NORVASC) 10 MG tablet, Take 1 tablet (10 mg total) by mouth daily. Marland Kitchen  amLODipine (NORVASC) 10 MG tablet, TAKE 1 TABLET BY MOUTH EVERY DAY .  lisinopril-hydrochlorothiazide (PRINZIDE,ZESTORETIC) 20-25 MG tablet, Take 1 tablet by mouth daily.     Current Outpatient Medications (Other):  .  azaTHIOprine (IMURAN) 50 MG tablet, Take 50 mg by mouth daily. Marland Kitchen  BAYER CONTOUR TEST test strip, TEST BLOOD SUGAR TWICE DAILY .  blood glucose meter kit and supplies KIT, Dispense based on patient and insurance preference. Use up to four times daily as directed. (FOR ICD-9 250.00, 250.01). .  cholecalciferol (VITAMIN D) 1000 units tablet,  Take 1 tablet (1,000 Units total) by mouth daily. Marland Kitchen  glucose blood (ONE TOUCH ULTRA TEST) test strip, USE AS DIRECTED .  Insulin Pen Needle (BD PEN NEEDLE NANO U/F) 32G X 4 MM MISC, INJECT THREE TIMES DAILY AS DIRECTED .  Lancets 30G MISC, Test blood sugars twice daily .  ondansetron (ZOFRAN) 4 MG tablet, Take 1 tablet (4 mg total) by mouth every 6 (six) hours as needed for nausea. Marland Kitchen  PEPPERMINT OIL PO, Take 1 drop by mouth See admin instructions. Place 1 drop into 12-16 ounces of water and drink as needed for nausea or headaches  Ht: Ht Readings from Last 1 Encounters:  11/08/18 '5\' 4"'  (1.626 m)     Wt:  Wt Readings from Last 3 Encounters:  12/04/18 162 lb 7.7 oz (73.7 kg)  11/08/18 162 lb 12.8 oz (73.8 kg)  10/26/18 158 lb 15.2 oz (72.1 kg)     BMI: 27     Current tobacco use? No  Labs:  Lipid Panel     Component Value Date/Time   CHOL 214 (H) 01/04/2018 0834   TRIG 49 01/04/2018 0834   HDL 98 01/04/2018 0834   CHOLHDL 2.2 01/04/2018 0834   VLDL 8.8 02/16/2017 0944   LDLCALC 102 (H) 01/04/2018 0834    Lab Results  Component Value Date   HGBA1C 6.6 (A) 08/31/2018   Note Spoke with pt. Pt is overweight.  Pt eats 2-3  meals a day; most prepared at home.  Making healthy food choices the majority of the time. Pt shared that snacking in the evening and cravings for sweets are her largest challenge. Discussed reducing sugar sweetened beverages and practicing mindful eating exercises. Additionally discussed the importance of eating regularly across the day to manage cravings. Pt's Rate Your Plate results reviewed with pt. Pt does avoid salty food; does not use canned/ convenience food.  Pt does not add salt to food.  The role of sodium in lung disease reviewed with pt. Pt expressed understanding of the information reviewed.   Pt is diabetic.  Pt checks CBG's twice a day.  CBG's reportedly  110-120's mg/dL before meals. Most CBG's at desired goal.  Pt expressed understanding of the  information reviewed via feedback method.    Nutrition Diagnosis  Food-and nutrition-related knowledge deficit related to lack of exposure to information as related to diagnosis of pulmonary disease  Overweight/obesity related to excessive energy intake as evidenced by a BMI of Body mass index is 27.72 kg/m.  Nutrition Intervention ? Pt's individual nutrition plan and goals reviewed with pt. ? Benefits of adopting healthy eating habits discussed when pt's Rate Your Plate reviewed.  Goal(s) 1. Pt to identify and limit food sources of sodium and refined carbohydrates 2. The pt will consume small frequent meals across the day 3. Identify food quantities necessary to achieve wt loss of  -2# per week to a goal wt loss of 2.7-10.9 kg (6-24 lb) at graduation from pulmonary rehab. 4. Describe the benefit of including fruits, vegetables, whole grains, and low-fat dairy products in a healthy meal plan. 5. Pt able to name foods that affect blood glucose   Plan:  Pt to attend Pulmonary Nutrition class Will provide client-centered nutrition education as part of interdisciplinary care.    Monitor and Evaluate progress toward nutrition goal with team.   Laurina Bustle, MS, RD, LDN 12/06/2018 2:28 PM

## 2018-12-11 ENCOUNTER — Encounter (HOSPITAL_COMMUNITY)
Admission: RE | Admit: 2018-12-11 | Discharge: 2018-12-11 | Disposition: A | Discharge status: Home / Self Care | Payer: 59 | Source: Ambulatory Visit | Attending: Pulmonary Disease | Admitting: Pulmonary Disease

## 2018-12-11 VITALS — Wt 162.3 lb

## 2018-12-11 DIAGNOSIS — D869 Sarcoidosis, unspecified: Secondary | ICD-10-CM

## 2018-12-11 NOTE — Progress Notes (Signed)
Daily Session Note  Patient Details  Name: Bethany Webb MRN: [id number] Date of Birth: 01-20-1961 Referring Provider:     Pulmonary Rehab Walk Test from 11/01/2018 in [place]  Referring Provider  Dr. Nelda Marseille      Encounter Date: 12/11/2018  Check In: Session Check In - 12/11/18 1028      Check-In   Supervising physician immediately available to respond to emergencies  See telemetry face sheet for immediately available MD    Physician(s)  Dr. Nevada Crane    Location  [facility]    Staff Present  Joycelyn Man RN, Maxcine Ham, RN, Bjorn Loser, MS, Exercise Physiologist;Carlette Wilber Oliphant, RN, Roque Cash, RN    Medication changes reported      No    Fall or balance concerns reported     No    Tobacco Cessation  No Change    Warm-up and Cool-down  Performed as group-led instruction    Resistance Training Performed  Yes    VAD Patient?  No    PAD/SET Patient?  No      Pain Assessment   Currently in Pain?  No/denies    Multiple Pain Sites  No       Capillary Blood Glucose: No results found for this or any previous visit (from the past 24 hour(s)).    Social History   Tobacco Use  Smoking Status Never Smoker  Smokeless Tobacco Never Used    Goals Met:  Proper associated with RPD/PD & O2 Sat Exercise tolerated well No report of cardiac concerns or symptoms Strength training completed today  Goals Unmet:  Not Applicable  Comments: Service time is from 1030 to 1210    Dr. Rush Farmer is Medical Director for Pulmonary Rehab at [facility].

## 2018-12-12 ENCOUNTER — Encounter (INDEPENDENT_AMBULATORY_CARE_PROVIDER_SITE_OTHER): Payer: Self-pay | Admitting: Physical Medicine and Rehabilitation

## 2018-12-12 ENCOUNTER — Ambulatory Visit (INDEPENDENT_AMBULATORY_CARE_PROVIDER_SITE_OTHER): Payer: 59 | Admitting: Physical Medicine and Rehabilitation

## 2018-12-12 DIAGNOSIS — R202 Paresthesia of skin: Secondary | ICD-10-CM | POA: Diagnosis not present

## 2018-12-12 NOTE — Progress Notes (Signed)
  Numeric Pain Rating Scale and Functional Assessment Average Pain 6   In the last MONTH (on 0-10 scale) has pain interfered with the following?  1. General activity like being  able to carry out your everyday physical activities such as walking, climbing stairs, carrying groceries, or moving a chair?  Rating(2)

## 2018-12-13 ENCOUNTER — Encounter (HOSPITAL_COMMUNITY): Payer: 59

## 2018-12-13 NOTE — Procedures (Signed)
EMG & NCV Findings: Evaluation of the left median motor nerve showed prolonged distal onset latency (7.8 ms) and decreased conduction velocity (Elbow-Wrist, 43 m/s).  The left median (across palm) sensory nerve showed prolonged distal peak latency (Wrist, 9.8 ms), reduced amplitude (7.4 V), and prolonged distal peak latency (Palm, 5.8 ms).  All remaining nerves (as indicated in the following tables) were within normal limits.    All examined muscles (as indicated in the following table) showed no evidence of electrical instability.    Impression: The above electrodiagnostic study is ABNORMAL and reveals evidence of a severe left median nerve entrapment at the wrist (carpal tunnel syndrome) affecting sensory and motor components.   There is no significant electrodiagnostic evidence of any other focal nerve entrapment, brachial plexopathy or cervical radiculopathy.  As you know, this particular electrodiagnostic study cannot rule out chemical radiculitis or sensory only radiculopathy.  Recommendations: 1.  Follow-up with referring physician. 2.  Continue current management of symptoms. 3.  Suggest surgical evaluation.  ___________________________ Laurence Spates FAAPMR Board Certified, American Board of Physical Medicine and Rehabilitation    Nerve Conduction Studies Anti Sensory Summary Table   Stim Site NR Peak (ms) Norm Peak (ms) P-T Amp (V) Norm P-T Amp Site1 Site2 Delta-P (ms) Dist (cm) Vel (m/s) Norm Vel (m/s)  Left Median Acr Palm Anti Sensory (2nd Digit)  30.6C  Wrist    *9.8 <3.6 *7.4 >10 Wrist Palm 4.0 0.0    Palm    *5.8 <2.0 6.3         Left Radial Anti Sensory (Base 1st Digit)  31.7C  Wrist    2.3 <3.1 29.3  Wrist Base 1st Digit 2.3 0.0    Left Ulnar Anti Sensory (5th Digit)  30.9C  Wrist    3.4 <3.7 44.5 >15.0 Wrist 5th Digit 3.4 14.0 41 >38   Motor Summary Table   Stim Site NR Onset (ms) Norm Onset (ms) O-P Amp (mV) Norm O-P Amp Site1 Site2 Delta-0 (ms) Dist (cm) Vel  (m/s) Norm Vel (m/s)  Left Median Motor (Abd Poll Brev)  31.7C  Wrist    *7.8 <4.2 9.3 >5 Elbow Wrist 4.8 20.5 *43 >50  Elbow    12.6  8.9         Left Ulnar Motor (Abd Dig Min)  31.6C  Wrist    3.3 <4.2 12.2 >3 B Elbow Wrist 3.6 20.0 56 >53  B Elbow    6.9  12.0  A Elbow B Elbow 1.2 10.0 83 >53  A Elbow    8.1  11.3          EMG   Side Muscle Nerve Root Ins Act Fibs Psw Amp Dur Poly Recrt Int Fraser Din Comment  Left Abd Poll Brev Median C8-T1 Nml Nml Nml Nml Nml 0 Nml Nml   Left 1stDorInt Ulnar C8-T1 Nml Nml Nml Nml Nml 0 Nml Nml   Left PronatorTeres Median C6-7 Nml Nml Nml Nml Nml 0 Nml Nml   Left Biceps Musculocut C5-6 Nml Nml Nml Nml Nml 0 Nml Nml   Left Deltoid Axillary C5-6 Nml Nml Nml Nml Nml 0 Nml Nml     Nerve Conduction Studies Anti Sensory Left/Right Comparison   Stim Site L Lat (ms) R Lat (ms) L-R Lat (ms) L Amp (V) R Amp (V) L-R Amp (%) Site1 Site2 L Vel (m/s) R Vel (m/s) L-R Vel (m/s)  Median Acr Palm Anti Sensory (2nd Digit)  30.6C  Wrist *9.8   *7.4  Wrist Palm     Palm *5.8   6.3         Radial Anti Sensory (Base 1st Digit)  31.7C  Wrist 2.3   29.3   Wrist Base 1st Digit     Ulnar Anti Sensory (5th Digit)  30.9C  Wrist 3.4   44.5   Wrist 5th Digit 41     Motor Left/Right Comparison   Stim Site L Lat (ms) R Lat (ms) L-R Lat (ms) L Amp (mV) R Amp (mV) L-R Amp (%) Site1 Site2 L Vel (m/s) R Vel (m/s) L-R Vel (m/s)  Median Motor (Abd Poll Brev)  31.7C  Wrist *7.8   9.3   Elbow Wrist *43    Elbow 12.6   8.9         Ulnar Motor (Abd Dig Min)  31.6C  Wrist 3.3   12.2   B Elbow Wrist 56    B Elbow 6.9   12.0   A Elbow B Elbow 83    A Elbow 8.1   11.3            Waveforms:

## 2018-12-13 NOTE — Progress Notes (Signed)
Bethany Webb - 57 y.o. female MRN [id number]  Date of birth: July 30, 1961  Office Visit Note: Visit Date: 12/12/2018 PCP: Carlena Hurl, PA-C Referred by: Carlena Hurl, PA-C  Subjective: Chief Complaint  Patient presents with  . Left Hand - Numbness, Pain   HPI: Bethany Webb is a 57 y.o. female who comes in today For electrodiagnostic study of the left upper limb as requested by Dr. Eduard Roux.  Patient's been having chronic worsening left hand pain with numbness and tingling in the more radial digits but sometimes the whole hand.  This really started to become worrisome and problematic since the summer.  She feels like she has decreased ability to grip small objects.  She gets some pain into the palm of the hand.  It does wake her up at night she does have nocturnal complaints.  She denies any frank radicular symptoms.  She has had prior carpal tunnel release on the right approximately 10 years ago.  She rates her average pain as a 6 out of 10.  She is an insulin-dependent diabetic with hemoglobin A1c around 6.  Case also complicated by history of prior ACDF.  ROS Otherwise per HPI.  Assessment & Plan: Visit Diagnoses:  1. Paresthesia of skin     Plan: Impression: The above electrodiagnostic study is ABNORMAL and reveals evidence of a severe left median nerve entrapment at the wrist (carpal tunnel syndrome) affecting sensory and motor components.   There is no significant electrodiagnostic evidence of any other focal nerve entrapment, brachial plexopathy or cervical radiculopathy.  As you know, this particular electrodiagnostic study cannot rule out chemical radiculitis or sensory only radiculopathy.  Recommendations: 1.  Follow-up with referring physician. 2.  Continue current management of symptoms. 3.  Suggest surgical evaluation.   Meds & Orders: No orders of the defined types were placed in this encounter.   Orders Placed This Encounter  Procedures  . NCV with  EMG (electromyography)    Follow-up: Return for  Eduard Roux, M.D..   Procedures: No procedures performed  EMG & NCV Findings: Evaluation of the left median motor nerve showed prolonged distal onset latency (7.8 ms) and decreased conduction velocity (Elbow-Wrist, 43 m/s).  The left median (across palm) sensory nerve showed prolonged distal peak latency (Wrist, 9.8 ms), reduced amplitude (7.4 V), and prolonged distal peak latency (Palm, 5.8 ms).  All remaining nerves (as indicated in the following tables) were within normal limits.    All examined muscles (as indicated in the following table) showed no evidence of electrical instability.    Impression: The above electrodiagnostic study is ABNORMAL and reveals evidence of a severe left median nerve entrapment at the wrist (carpal tunnel syndrome) affecting sensory and motor components.   There is no significant electrodiagnostic evidence of any other focal nerve entrapment, brachial plexopathy or cervical radiculopathy.  As you know, this particular electrodiagnostic study cannot rule out chemical radiculitis or sensory only radiculopathy.  Recommendations: 1.  Follow-up with referring physician. 2.  Continue current management of symptoms. 3.  Suggest surgical evaluation.  ___________________________ Laurence Spates FAAPMR Board Certified, American Board of Physical Medicine and Rehabilitation    Nerve Conduction Studies Anti Sensory Summary Table   Stim Site NR Peak (ms) Norm Peak (ms) P-T Amp (V) Norm P-T Amp Site1 Site2 Delta-P (ms) Dist (cm) Vel (m/s) Norm Vel (m/s)  Left Median Acr Palm Anti Sensory (2nd Digit)  30.6C  Wrist    *9.8 <3.6 *7.4 >10 Wrist Palm  4.0 0.0    Palm    *5.8 <2.0 6.3         Left Radial Anti Sensory (Base 1st Digit)  31.7C  Wrist    2.3 <3.1 29.3  Wrist Base 1st Digit 2.3 0.0    Left Ulnar Anti Sensory (5th Digit)  30.9C  Wrist    3.4 <3.7 44.5 >15.0 Wrist 5th Digit 3.4 14.0 41 >38   Motor Summary  Table   Stim Site NR Onset (ms) Norm Onset (ms) O-P Amp (mV) Norm O-P Amp Site1 Site2 Delta-0 (ms) Dist (cm) Vel (m/s) Norm Vel (m/s)  Left Median Motor (Abd Poll Brev)  31.7C  Wrist    *7.8 <4.2 9.3 >5 Elbow Wrist 4.8 20.5 *43 >50  Elbow    12.6  8.9         Left Ulnar Motor (Abd Dig Min)  31.6C  Wrist    3.3 <4.2 12.2 >3 B Elbow Wrist 3.6 20.0 56 >53  B Elbow    6.9  12.0  A Elbow B Elbow 1.2 10.0 83 >53  A Elbow    8.1  11.3          EMG   Side Muscle Nerve Root Ins Act Fibs Psw Amp Dur Poly Recrt Int Fraser Din Comment  Left Abd Poll Brev Median C8-T1 Nml Nml Nml Nml Nml 0 Nml Nml   Left 1stDorInt Ulnar C8-T1 Nml Nml Nml Nml Nml 0 Nml Nml   Left PronatorTeres Median C6-7 Nml Nml Nml Nml Nml 0 Nml Nml   Left Biceps Musculocut C5-6 Nml Nml Nml Nml Nml 0 Nml Nml   Left Deltoid Axillary C5-6 Nml Nml Nml Nml Nml 0 Nml Nml     Nerve Conduction Studies Anti Sensory Left/Right Comparison   Stim Site L Lat (ms) R Lat (ms) L-R Lat (ms) L Amp (V) R Amp (V) L-R Amp (%) Site1 Site2 L Vel (m/s) R Vel (m/s) L-R Vel (m/s)  Median Acr Palm Anti Sensory (2nd Digit)  30.6C  Wrist *9.8   *7.4   Wrist Palm     Palm *5.8   6.3         Radial Anti Sensory (Base 1st Digit)  31.7C  Wrist 2.3   29.3   Wrist Base 1st Digit     Ulnar Anti Sensory (5th Digit)  30.9C  Wrist 3.4   44.5   Wrist 5th Digit 41     Motor Left/Right Comparison   Stim Site L Lat (ms) R Lat (ms) L-R Lat (ms) L Amp (mV) R Amp (mV) L-R Amp (%) Site1 Site2 L Vel (m/s) R Vel (m/s) L-R Vel (m/s)  Median Motor (Abd Poll Brev)  31.7C  Wrist *7.8   9.3   Elbow Wrist *43    Elbow 12.6   8.9         Ulnar Motor (Abd Dig Min)  31.6C  Wrist 3.3   12.2   B Elbow Wrist 56    B Elbow 6.9   12.0   A Elbow B Elbow 83    A Elbow 8.1   11.3            Waveforms:            Clinical History: No specialty comments available.   She reports that she has never smoked. She has never used smokeless tobacco.  Recent Labs     01/04/18 0834 08/31/18 0852  HGBA1C 5.8* 6.6*    Objective:  VS:  HT:    WT:   BMI:     BP:   HR: bpm  TEMP: ( )  RESP:  Physical Exam Musculoskeletal:        General: No swelling, tenderness or deformity.     Comments: Inspection reveals well-healed carpal tunnel release scar on the right and mild flattening of the left APB but no atrophy of the bilateral APB or FDI or hand intrinsics. There is no swelling, color changes, allodynia or dystrophic changes. There is 5 out of 5 strength in the bilateral wrist extension, finger abduction and long finger flexion.  There is decreased sensation to light touch in the left median nerve distribution.  There is a negative Hoffmann's test bilaterally.  Skin:    General: Skin is warm and dry.     Findings: No erythema or rash.  Neurological:     General: No focal deficit present.     Mental Status: She is alert and oriented to person, place, and time.     Motor: No weakness or abnormal muscle tone.     Coordination: Coordination normal.  Psychiatric:        Mood and Affect: Mood normal.        Behavior: Behavior normal.     Ortho Exam Imaging: No results found.  Past Medical/Family/Surgical/Social History: Medications & Allergies reviewed per EMR, new medications updated. Patient Active Problem List   Diagnosis Date Noted  . De Quervain's disease (tenosynovitis) 11/16/2018  . Right wrist tendonitis 11/08/2018  . Left hand pain 11/08/2018  . Myalgia 11/08/2018  . Low back pain 11/08/2018  . Lower extremity pain, diffuse 10/01/2018  . Hypokalemia 09/28/2018  . Tachycardia 09/27/2018  . Diffuse pain 09/27/2018  . Near syncope 09/27/2018  . Left hand paresthesia 08/31/2018  . Carpal tunnel syndrome 08/31/2018  . Arthralgia of hand, right 08/31/2018  . Abdominal pain 06/08/2018  . Elevated lipase 06/08/2018  . Radicular pain of lower extremity 06/08/2018  . Acute bilateral low back pain with sciatica 06/08/2018  . Paresthesia  04/30/2018  . Weight gain 01/09/2018  . High risk medication use 01/09/2018  . Need for influenza vaccination 09/21/2017  . Vaccine counseling 09/21/2017  . Nausea and vomiting 09/15/2016  . Abdominal pain, epigastric 09/15/2016  . RUQ pain 09/15/2016  . 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 Sarcoidosis 01/21/2016  . 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 Abnormal liver function 01/21/2016  . Elevated liver enzymes 01/05/2016  . Abnormal chest CT 01/05/2016  . Lymphadenopathy, mediastinal 01/05/2016  . Pleural plaque 01/05/2016  . Pulmonary nodule 01/05/2016  . Anorexia 01/05/2016  . Night sweats 01/05/2016  . Allergic conjunctivitis 10/18/2011  . Allergic rhinitis 10/18/2011  . Diabetes mellitus with complication ([place]) 12/18/8249  . Hyperlipidemia associated with type 2 diabetes mellitus ([place]) 10/06/2011  . Hypertension associated with diabetes ([place]) 10/06/2011   Past Medical History:  Diagnosis Date  . 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 Biceps tendonitis 2/12   Dr. Gladstone Lighter  . History of blood transfusion 1988   "related to hysterectomy" (09/27/2018)  . History of mammogram 05/25/12   stable, no suspicious finding, extremely dense breast tissue  . Hyperlipidemia   . Hypertension   . Neutropenia, unspecified ([place]) 02/2013   slight, likely ethnicity related  . Sarcoidosis    "lung, liver, eyes, skin"   . Seasonal allergies   .  Shortness of breath dyspnea    with exertion   . Supraspinatus tendonitis 2/12   Dr. Gladstone Lighter  . Type II diabetes mellitus ([place])   . Wears glasses    Family History  Problem Relation Age of Onset  . Heart disease Father 36       died of MI, CABG   . Multiple sclerosis Sister   . Asthma Brother   . Cancer Paternal Grandmother        breast  . Diabetes Other        maternal and paternal grandmother  . Cancer  Maternal Uncle        hematologic  . Hypertension Maternal Grandmother   . Cancer Maternal Grandfather        prostate  . Hypertension Maternal Grandfather   . Asthma Brother   . Eczema Sister   . Allergies Sister   . Healthy Daughter    Past Surgical History:  Procedure Laterality Date  . ABDOMINAL HYSTERECTOMY  1990   and BSO; endometriosis  . ANTERIOR CERVICAL DECOMP/DISCECTOMY FUSION N/A 06/20/2013   Procedure: ANTERIOR CERVICAL DECOMPRESSION/DISCECTOMY FUSION 1 LEVEL;  Surgeon: Ophelia Charter, MD;  Location: [facility];  Service: Neurosurgery;  Laterality: N/A;  C45 anterior cervical decompression with fusion interbody prothesis plating and bonegraft  . BACK SURGERY    . CARPAL TUNNEL RELEASE Right 06/20/2013   Procedure: CARPAL TUNNEL RELEASE;  Surgeon: Ophelia Charter, MD;  Location: [facility];  Service: Neurosurgery;  Laterality: Right;  RIGHT carpal tunnel release  . COLONOSCOPY  03/06/12   sigmoid polyp; repeat 10 years; Dr. Tora Duck  . LUNG SURGERY  1987   endometriosis in lungs  . REDUCTION MAMMAPLASTY  1994  . SUPRACLAVICAL NODE BIOPSY Left 01/14/2016   Procedure: LEFT SUPRACLAVICAL NODE BIOPSY;  Surgeon: Melrose Nakayama, MD;  Location: [facility];  Service: Thoracic;  Laterality: Left;  . TONSILLECTOMY     Social History   Occupational History  . Occupation: Midwife  Tobacco Use  . Smoking status: Never Smoker  . Smokeless tobacco: Never Used  Substance and Sexual Activity  . Alcohol use: Yes    Alcohol/week: 0.0 standard drinks    Comment: rare wine, maybe 2-3 times years  . Drug use: No  . Sexual activity: Not on file

## 2018-12-18 ENCOUNTER — Encounter (HOSPITAL_COMMUNITY): Payer: 59

## 2018-12-20 ENCOUNTER — Encounter (HOSPITAL_COMMUNITY): Payer: 59

## 2018-12-21 ENCOUNTER — Ambulatory Visit (INDEPENDENT_AMBULATORY_CARE_PROVIDER_SITE_OTHER): Payer: 59 | Admitting: Orthopaedic Surgery

## 2018-12-21 DIAGNOSIS — G5602 Carpal tunnel syndrome, left upper limb: Secondary | ICD-10-CM

## 2018-12-21 DIAGNOSIS — M654 Radial styloid tenosynovitis [de Quervain]: Secondary | ICD-10-CM

## 2018-12-21 NOTE — Progress Notes (Signed)
Office Visit Note   Patient: Bethany Webb           Date of Birth: 08-27-1961           MRN: [id number] Visit Date: 12/21/2018              Requested by: Carlena Hurl, PA-C [address] [place], [place] [address] PCP: Carlena Hurl, PA-C   Assessment & Plan: Visit Diagnoses:  1. Left carpal tunnel syndrome   2. De Quervain's disease (tenosynovitis)     Plan: Based on nerve conduction studies the right has severe left carpal tunnel syndrome and recalcitrant right de Quervain's tenosynovitis.  Again we discussed all treatment options and their associated risks and benefits.  She would like to proceed with left carpal tunnel release and injection of her right de Quervain's tenosynovitis at that time.  We will get her scheduled in the near future based on her schedule.  Follow-Up Instructions: Return if symptoms worsen or fail to improve.   Orders:  No orders of the defined types were placed in this encounter.  No orders of the defined types were placed in this encounter.     Procedures: No procedures performed   Clinical Data: No additional findings.   Subjective: Chief Complaint  Patient presents with  . Left Hand - Numbness, Follow-up    Post NCS/EMG  . Right Arm - Pain, Follow-up    Bethany Webb comes in for review of her left carpal tunnel nerve conduction studies.  These revealed severe carpal tunnel syndrome.  She is also here to discuss her right de Quervain's tenosynovitis.  She is not interested in injections for fear of pain.   Review of Systems  Constitutional: Negative.   HENT: Negative.   Eyes: Negative.   Respiratory: Negative.   Cardiovascular: Negative.   Endocrine: Negative.   Musculoskeletal: Negative.   Neurological: Negative.   Hematological: Negative.   Psychiatric/Behavioral: Negative.   All other systems reviewed and are negative.    Objective: Vital Signs: LMP 12/27/1987   Physical Exam Vitals signs and nursing note  reviewed.  Constitutional:      Appearance: She is well-developed.  Pulmonary:     Effort: Pulmonary effort is normal.  Skin:    General: Skin is warm.     Capillary Refill: Capillary refill takes less than 2 seconds.  Neurological:     Mental Status: She is alert and oriented to person, place, and time.  Psychiatric:        Behavior: Behavior normal.        Thought Content: Thought content normal.        Judgment: Judgment normal.     Ortho Exam Bilateral hand exams are stable. Specialty Comments:  No specialty comments available.  Imaging: No results found.   PMFS History: Patient Active Problem List   Diagnosis Date Noted  . De Quervain's disease (tenosynovitis) 11/16/2018  . Right wrist tendonitis 11/08/2018  . Left hand pain 11/08/2018  . Myalgia 11/08/2018  . Low back pain 11/08/2018  . Lower extremity pain, diffuse 10/01/2018  . Hypokalemia 09/28/2018  . Tachycardia 09/27/2018  . Diffuse pain 09/27/2018  . Near syncope 09/27/2018  . Left hand paresthesia 08/31/2018  . Carpal tunnel syndrome 08/31/2018  . Arthralgia of hand, right 08/31/2018  . Abdominal pain 06/08/2018  . Elevated lipase 06/08/2018  . Radicular pain of lower extremity 06/08/2018  . Acute bilateral low back pain with sciatica 06/08/2018  . Paresthesia 04/30/2018  .  Weight gain 01/09/2018  . High risk medication use 01/09/2018  . Need for influenza vaccination 09/21/2017  . Vaccine counseling 09/21/2017  . Nausea and vomiting 09/15/2016  . Abdominal pain, epigastric 09/15/2016  . RUQ pain 09/15/2016  . 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 Sarcoidosis 01/21/2016  . 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 Abnormal liver function 01/21/2016  . Elevated liver enzymes 01/05/2016  . Abnormal chest CT 01/05/2016  . Lymphadenopathy, mediastinal 01/05/2016  . Pleural plaque 01/05/2016  . Pulmonary nodule 01/05/2016  . Anorexia 01/05/2016  .  Night sweats 01/05/2016  . Allergic conjunctivitis 10/18/2011  . Allergic rhinitis 10/18/2011  . Diabetes mellitus with complication ([place]) 62/69/4854  . Hyperlipidemia associated with type 2 diabetes mellitus ([place]) 10/06/2011  . Hypertension associated with diabetes ([place]) 10/06/2011   Past Medical History:  Diagnosis Date  . 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 Biceps tendonitis 2/12   Dr. Gladstone Lighter  . History of blood transfusion 1988   "related to hysterectomy" (09/27/2018)  . History of mammogram 05/25/12   stable, no suspicious finding, extremely dense breast tissue  . Hyperlipidemia   . Hypertension   . Neutropenia, unspecified ([place]) 02/2013   slight, likely ethnicity related  . Sarcoidosis    "lung, liver, eyes, skin"   . Seasonal allergies   . Shortness of breath dyspnea    with exertion   . Supraspinatus tendonitis 2/12   Dr. Gladstone Lighter  . Type II diabetes mellitus ([place])   . Wears glasses     Family History  Problem Relation Age of Onset  . Heart disease Father 26       died of MI, CABG   . Multiple sclerosis Sister   . Asthma Brother   . Cancer Paternal Grandmother        breast  . Diabetes Other        maternal and paternal grandmother  . Cancer Maternal Uncle        hematologic  . Hypertension Maternal Grandmother   . Cancer Maternal Grandfather        prostate  . Hypertension Maternal Grandfather   . Asthma Brother   . Eczema Sister   . Allergies Sister   . Healthy Daughter     Past Surgical History:  Procedure Laterality Date  . ABDOMINAL HYSTERECTOMY  1990   and BSO; endometriosis  . ANTERIOR CERVICAL DECOMP/DISCECTOMY FUSION N/A 06/20/2013   Procedure: ANTERIOR CERVICAL DECOMPRESSION/DISCECTOMY FUSION 1 LEVEL;  Surgeon: Ophelia Charter, MD;  Location: [facility];  Service: Neurosurgery;  Laterality: N/A;  C45 anterior cervical decompression with fusion interbody  prothesis plating and bonegraft  . BACK SURGERY    . CARPAL TUNNEL RELEASE Right 06/20/2013   Procedure: CARPAL TUNNEL RELEASE;  Surgeon: Ophelia Charter, MD;  Location: [facility];  Service: Neurosurgery;  Laterality: Right;  RIGHT carpal tunnel release  . COLONOSCOPY  03/06/12   sigmoid polyp; repeat 10 years; Dr. Tora Duck  . LUNG SURGERY  1987   endometriosis in lungs  . REDUCTION MAMMAPLASTY  1994  . SUPRACLAVICAL NODE BIOPSY Left 01/14/2016   Procedure: LEFT SUPRACLAVICAL NODE BIOPSY;  Surgeon: Melrose Nakayama, MD;  Location: [facility];  Service: Thoracic;  Laterality: Left;  . TONSILLECTOMY     Social History   Occupational History  . Occupation: Midwife  Tobacco Use  . Smoking  status: Never Smoker  . Smokeless tobacco: Never Used  Substance and Sexual Activity  . Alcohol use: Yes    Alcohol/week: 0.0 standard drinks    Comment: rare wine, maybe 2-3 times years  . Drug use: No  . Sexual activity: Not on file

## 2018-12-22 ENCOUNTER — Telehealth: Payer: Self-pay | Admitting: Medical

## 2018-12-22 NOTE — Telephone Encounter (Signed)
P.A. Cira Servant -  Novolog was discontinued

## 2018-12-25 ENCOUNTER — Encounter (HOSPITAL_COMMUNITY): Payer: 59

## 2018-12-27 ENCOUNTER — Encounter (HOSPITAL_COMMUNITY): Payer: 59

## 2019-01-01 ENCOUNTER — Encounter (HOSPITAL_COMMUNITY): Payer: 59

## 2019-01-01 ENCOUNTER — Telehealth (HOSPITAL_COMMUNITY): Payer: Self-pay

## 2019-01-02 ENCOUNTER — Telehealth (HOSPITAL_COMMUNITY): Payer: Self-pay | Admitting: Medical

## 2019-01-02 ENCOUNTER — Encounter (HOSPITAL_COMMUNITY): Payer: Self-pay

## 2019-01-02 NOTE — Progress Notes (Signed)
Pulmonary Individual Treatment Plan  Patient Details  Name: Bethany Webb MRN: [id number] Date of Birth: October 21, 1961 Referring Provider:     Pulmonary Rehab Walk Test from 11/01/2018 in [facility]  Referring Provider  Dr. Nelda Marseille      Initial Encounter Date:    Pulmonary Rehab Walk Test from 11/01/2018 in [facility]  Date  11/01/18      Visit Diagnosis: Sarcoidosis  Patient's Home Medications on Admission:   Current Outpatient Medications:  .  amLODipine (NORVASC) 10 MG tablet, Take 1 tablet (10 mg total) by mouth daily., Disp: 90 tablet, Rfl: 3 .  azaTHIOprine (IMURAN) 50 MG tablet, Take 50 mg by mouth daily., Disp: , Rfl:  .  BAYER CONTOUR TEST test strip, TEST BLOOD SUGAR TWICE DAILY, Disp: 100 each, Rfl: 0 .  blood glucose meter kit and supplies KIT, Dispense based on patient and insurance preference. Use up to four times daily as directed. (FOR ICD-9 250.00, 250.01)., Disp: 1 each, Rfl: 0 .  cholecalciferol (VITAMIN D) 1000 units tablet, Take 1 tablet (1,000 Units total) by mouth daily., Disp: 90 tablet, Rfl: 3 .  glucose blood (ONE TOUCH ULTRA TEST) test strip, USE AS DIRECTED, Disp: 100 each, Rfl: 2 .  Insulin Lispro Prot & Lispro (HUMALOG MIX 75/25 KWIKPEN) (75-25) 100 UNIT/ML Kwikpen, Inject 5 Units into the skin 3 (three) times daily., Disp: 15 mL, Rfl: 3 .  Insulin Pen Needle (BD PEN NEEDLE NANO U/F) 32G X 4 MM MISC, INJECT THREE TIMES DAILY AS DIRECTED, Disp: 100 each, Rfl: 11 .  Lancets 30G MISC, Test blood sugars twice daily, Disp: 100 each, Rfl: 2 .  lisinopril-hydrochlorothiazide (PRINZIDE,ZESTORETIC) 20-25 MG tablet, Take 1 tablet by mouth daily., Disp: 90 tablet, Rfl: 3 .  metFORMIN (GLUCOPHAGE-XR) 500 MG 24 hr tablet, Take 1 tablet (500 mg total) by mouth daily with breakfast., Disp: 30 tablet, Rfl: 2 .  PEPPERMINT OIL PO, Take 1 drop by mouth See admin instructions. Place 1 drop into 12-16 ounces of water  and drink as needed for nausea or headaches, Disp: , Rfl:   Past Medical History: Past Medical History:  Diagnosis Date  . 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 Biceps tendonitis 2/12   Dr. Gladstone Lighter  . History of blood transfusion 1988   "related to hysterectomy" (09/27/2018)  . History of mammogram 05/25/12   stable, no suspicious finding, extremely dense breast tissue  . Hyperlipidemia   . Hypertension   . Neutropenia, unspecified ([place]) 02/2013   slight, likely ethnicity related  . Sarcoidosis    "lung, liver, eyes, skin"   . Seasonal allergies   . Shortness of breath dyspnea    with exertion   . Supraspinatus tendonitis 2/12   Dr. Gladstone Lighter  . Type II diabetes mellitus ([place])   . Wears glasses     Tobacco Use: Social History   Tobacco Use  Smoking Status Never Smoker  Smokeless Tobacco Never Used    Labs: Recent Review Flowsheet Data    Labs for ITP Cardiac and Pulmonary Rehab Latest Ref Rng & Units 09/02/2016 10/14/2016 02/16/2017 01/04/2018 08/31/2018   Cholestrol <200 mg/dL 250(H) - 219(H) 214(H) -   LDLCALC mg/dL (calc) 175(H) - 149(H) 102(H) -   HDL >50 mg/dL 60.60 - 61.30 98 -   Trlycerides <150 mg/dL 72.0 - 44.0 49 -   Hemoglobin A1c 4.0 -  5.6 % - 7.1(H) 6.5 5.8(H) 6.6(A)      Capillary Blood Glucose: Lab Results  Component Value Date   GLUCAP 109 (H) 09/29/2018   GLUCAP 139 (H) 09/28/2018   GLUCAP 101 (H) 09/28/2018   GLUCAP 84 09/28/2018   GLUCAP 82 09/28/2018   POCT Glucose    Row Name 12/04/18 1229 12/18/18 1250           POCT Blood Glucose   Pre-Exercise  134 mg/dL  120 mg/dL      Post-Exercise  125 mg/dL  129 mg/dL         Pulmonary Assessment Scores: Pulmonary Assessment Scores    Row Name 10/30/18 1633 11/01/18 1626       ADL UCSD   ADL Phase  Entry  Entry    SOB Score total  40  -      CAT Score   CAT Score  8 pre  -      mMRC Score   mMRC Score  -  1        Pulmonary Function Assessment:   Exercise Target Goals: Exercise Program Goal: Individual exercise prescription set using results from initial 6 min walk test and THRR while considering  patient's activity barriers and safety.   Exercise Prescription Goal: Initial exercise prescription builds to 30-45 minutes a day of aerobic activity, 2-3 days per week.  Home exercise guidelines will be given to patient during program as part of exercise prescription that the participant will acknowledge.  Activity Barriers & Risk Stratification: Activity Barriers & Cardiac Risk Stratification - 10/26/18 0934      Activity Barriers & Cardiac Risk Stratification   Activity Barriers  Arthritis    Cardiac Risk Stratification  Low       6 Minute Walk: 6 Minute Walk    Row Name 11/01/18 1627         6 Minute Walk   Phase  Initial     Distance  1622 feet     Walk Time  6 minutes     # of Rest Breaks  0     MPH  3.07     METS  3.37     RPE  11     Perceived Dyspnea   1     Symptoms  No     Resting HR  84 bpm     Resting BP  128/70     Resting Oxygen Saturation   99 %     Exercise Oxygen Saturation  during 6 min walk  94 %     Max Ex. HR  127 bpm     Max Ex. BP  134/68     2 Minute Post BP  118/72       Interval HR   1 Minute HR  117     2 Minute HR  118     3 Minute HR  115     4 Minute HR  121     5 Minute HR  127     6 Minute HR  122     2 Minute Post HR  89     Interval Heart Rate?  Yes       Interval Oxygen   Interval Oxygen?  Yes     Baseline Oxygen Saturation %  99 %     1 Minute Oxygen Saturation %  98 %     1 Minute Liters of Oxygen  0 L  2 Minute Oxygen Saturation %  95 %     2 Minute Liters of Oxygen  0 L     3 Minute Oxygen Saturation %  97 %     3 Minute Liters of Oxygen  0 L     4 Minute Oxygen Saturation %  99 %     4 Minute Liters of Oxygen  0 L     5 Minute Oxygen Saturation %  100 %     5 Minute Liters of Oxygen  0 L     6 Minute Oxygen  Saturation %  94 %     6 Minute Liters of Oxygen  0 L     2 Minute Post Oxygen Saturation %  100 %     2 Minute Post Liters of Oxygen  0 L        Oxygen Initial Assessment: Oxygen Initial Assessment - 11/01/18 1626      Home Oxygen   Home Oxygen Device  None    Sleep Oxygen Prescription  None    Home Exercise Oxygen Prescription  None    Home at Rest Exercise Oxygen Prescription  None      Initial 6 min Walk   Oxygen Used  None      Program Oxygen Prescription   Program Oxygen Prescription  None       Oxygen Re-Evaluation: Oxygen Re-Evaluation    Row Name 12/04/18 1608 01/01/19 0732           Program Oxygen Prescription   Program Oxygen Prescription  None  None        Home Oxygen   Home Oxygen Device  None  None      Sleep Oxygen Prescription  None  None      Home Exercise Oxygen Prescription  None  None      Home at Rest Exercise Oxygen Prescription  None  None        Goals/Expected Outcomes   Short Term Goals  To learn and exhibit compliance with exercise, home and travel O2 prescription;To learn and understand importance of maintaining oxygen saturations>88%;To learn and understand importance of monitoring SPO2 with pulse oximeter and demonstrate accurate use of the pulse oximeter.;To learn and demonstrate proper pursed lip breathing techniques or other breathing techniques.  To learn and exhibit compliance with exercise, home and travel O2 prescription;To learn and understand importance of maintaining oxygen saturations>88%;To learn and understand importance of monitoring SPO2 with pulse oximeter and demonstrate accurate use of the pulse oximeter.;To learn and demonstrate proper pursed lip breathing techniques or other breathing techniques.      Long  Term Goals  Exhibits compliance with exercise, home and travel O2 prescription;Maintenance of O2 saturations>88%;Verbalizes importance of monitoring SPO2 with pulse oximeter and return demonstration;Exhibits proper  breathing techniques, such as pursed lip breathing or other method taught during program session  Exhibits compliance with exercise, home and travel O2 prescription;Maintenance of O2 saturations>88%;Verbalizes importance of monitoring SPO2 with pulse oximeter and return demonstration;Exhibits proper breathing techniques, such as pursed lip breathing or other method taught during program session      Goals/Expected Outcomes  compliance   compliance          Oxygen Discharge (Final Oxygen Re-Evaluation): Oxygen Re-Evaluation - 01/01/19 0732      Program Oxygen Prescription   Program Oxygen Prescription  None      Home Oxygen   Home Oxygen Device  None    Sleep Oxygen Prescription  None  Home Exercise Oxygen Prescription  None    Home at Rest Exercise Oxygen Prescription  None      Goals/Expected Outcomes   Short Term Goals  To learn and exhibit compliance with exercise, home and travel O2 prescription;To learn and understand importance of maintaining oxygen saturations>88%;To learn and understand importance of monitoring SPO2 with pulse oximeter and demonstrate accurate use of the pulse oximeter.;To learn and demonstrate proper pursed lip breathing techniques or other breathing techniques.    Long  Term Goals  Exhibits compliance with exercise, home and travel O2 prescription;Maintenance of O2 saturations>88%;Verbalizes importance of monitoring SPO2 with pulse oximeter and return demonstration;Exhibits proper breathing techniques, such as pursed lip breathing or other method taught during program session    Goals/Expected Outcomes  compliance        Initial Exercise Prescription: Initial Exercise Prescription - 11/01/18 1600      Date of Initial Exercise RX and Referring Provider   Date  11/01/18    Referring Provider  Dr. Nelda Marseille      Treadmill   MPH  1.7    Grade  0    Minutes  17      Bike   Level  0.4    Minutes  17      NuStep   Level  3    SPM  80    Minutes  17       Prescription Details   Frequency (times per week)  2    Duration  Progress to 45 minutes of aerobic exercise without signs/symptoms of physical distress      Intensity   THRR 40-80% of Max Heartrate  64-129    Ratings of Perceived Exertion  11-13    Perceived Dyspnea  0-4      Progression   Progression  Continue to progress workloads to maintain intensity without signs/symptoms of physical distress.      Resistance Training   Training Prescription  Yes    Weight  orange bands    Reps  10-15       Perform Capillary Blood Glucose checks as needed.  Exercise Prescription Changes:  Exercise Prescription Changes    Row Name 11/15/18 1545 11/29/18 1200 12/04/18 1200 12/11/18 1251       Response to Exercise   Blood Pressure (Admit)  150/80  -  142/80  156/90    Blood Pressure (Exercise)  144/90  -  144/80  148/80    Blood Pressure (Exit)  108/78  -  136/70  125/72    Heart Rate (Admit)  97 bpm  -  99 bpm  93 bpm    Heart Rate (Exercise)  110 bpm  -  105 bpm  124 bpm    Heart Rate (Exit)  99 bpm  -  87 bpm  100 bpm    Oxygen Saturation (Admit)  98 %  -  98 %  98 %    Oxygen Saturation (Exercise)  96 %  -  97 %  98 %    Oxygen Saturation (Exit)  100 %  -  99 %  98 %    Rating of Perceived Exertion (Exercise)  9  -  9  7    Perceived Dyspnea (Exercise)  0  -  0  0    Duration  Progress to 45 minutes of aerobic exercise without signs/symptoms of physical distress  -  Continue with 45 min of aerobic exercise without signs/symptoms of physical distress.  Continue with  45 min of aerobic exercise without signs/symptoms of physical distress.    Intensity  - 40-80% hrr  -  THRR unchanged  THRR unchanged      Progression   Progression  -  -  Continue to progress workloads to maintain intensity without signs/symptoms of physical distress.  Continue to progress workloads to maintain intensity without signs/symptoms of physical distress.      Resistance Training   Training Prescription   Yes  -  Yes  Yes    Weight  orange bands  -  orange bands  orange bands    Reps  10-15  -  10-15  10-15    Time  10 Minutes  -  10 Minutes  10 Minutes      Interval Training   Interval Training  No  -  -  -      Treadmill   MPH  -  -  2  2.9    Grade  -  -  2  3    Minutes  -  -  17  17      Bike   Level  0.4  -  0.6  0.8    Minutes  17  -  17  17      NuStep   Level  3  -  4  4    SPM  80  -  80  80    Minutes  2.5  -  1.9  17    METs  -  -  -  2.6      Home Exercise Plan   Plans to continue exercise at  -  Home (comment)  -  -    Frequency  -  Add 3 additional days to program exercise sessions.  -  -    Initial Home Exercises Provided  -  11/29/18  -  -       Exercise Comments:  Exercise Comments    Row Name 11/29/18 1215           Exercise Comments  home exercise complete          Exercise Goals and Review:  Exercise Goals    Row Name 10/26/18 0940             Exercise Goals   Increase Physical Activity  Yes       Intervention  Provide advice, education, support and counseling about physical activity/exercise needs.;Develop an individualized exercise prescription for aerobic and resistive training based on initial evaluation findings, risk stratification, comorbidities and participant's personal goals.       Expected Outcomes  Short Term: Attend rehab on a regular basis to increase amount of physical activity.;Long Term: Exercising regularly at least 3-5 days a week.;Long Term: Add in home exercise to make exercise part of routine and to increase amount of physical activity.       Increase Strength and Stamina  Yes       Intervention  Provide advice, education, support and counseling about physical activity/exercise needs.;Develop an individualized exercise prescription for aerobic and resistive training based on initial evaluation findings, risk stratification, comorbidities and participant's personal goals.       Expected Outcomes  Short Term: Increase  workloads from initial exercise prescription for resistance, speed, and METs.;Short Term: Perform resistance training exercises routinely during rehab and add in resistance training at home;Long Term: Improve cardiorespiratory fitness, muscular endurance and strength as measured by increased METs and functional capacity (6MWT)  Able to understand and use rate of perceived exertion (RPE) scale  Yes       Intervention  Provide education and explanation on how to use RPE scale       Expected Outcomes  Short Term: Able to use RPE daily in rehab to express subjective intensity level;Long Term:  Able to use RPE to guide intensity level when exercising independently       Able to understand and use Dyspnea scale  Yes       Intervention  Provide education and explanation on how to use Dyspnea scale       Expected Outcomes  Short Term: Able to use Dyspnea scale daily in rehab to express subjective sense of shortness of breath during exertion;Long Term: Able to use Dyspnea scale to guide intensity level when exercising independently       Knowledge and understanding of Target Heart Rate Range (THRR)  Yes       Intervention  Provide education and explanation of THRR including how the numbers were predicted and where they are located for reference       Expected Outcomes  Short Term: Able to state/look up THRR;Long Term: Able to use THRR to govern intensity when exercising independently;Short Term: Able to use daily as guideline for intensity in rehab       Understanding of Exercise Prescription  Yes       Intervention  Provide education, explanation, and written materials on patient's individual exercise prescription       Expected Outcomes  Short Term: Able to explain program exercise prescription;Long Term: Able to explain home exercise prescription to exercise independently          Exercise Goals Re-Evaluation : Exercise Goals Re-Evaluation    Row Name 12/04/18 1608 01/01/19 0733            Exercise Goal Re-Evaluation   Exercise Goals Review  Increase Physical Activity;Increase Strength and Stamina;Able to understand and use rate of perceived exertion (RPE) scale;Able to understand and use Dyspnea scale;Knowledge and understanding of Target Heart Rate Range (THRR);Understanding of Exercise Prescription  Increase Physical Activity;Increase Strength and Stamina;Able to understand and use rate of perceived exertion (RPE) scale;Able to understand and use Dyspnea scale;Knowledge and understanding of Target Heart Rate Range (THRR);Understanding of Exercise Prescription      Comments  Pt has completed 5 visits so far. She is open to workload increases and has progressed well so far. She exercise on the treadmill at a moderate MET level of 3.08. Will continue to monitor and progress as able.   Pt was progressing well and was exercising at 4.42 METs on the treadmill. Pt has missed the last 5 exercise sessions. Will follow up with pt if she misses today as well. Will continue to monitor and progress as well.       Expected Outcomes  Through exercise at rehab and at home, the patient will decrease shortness of breath with daily activities and feel confident in carrying out an exercise regime at home.   Through exercise at rehab and at home, the patient will decrease shortness of breath with daily activities and feel confident in carrying out an exercise regime at home.          Discharge Exercise Prescription (Final Exercise Prescription Changes): Exercise Prescription Changes - 12/11/18 1251      Response to Exercise   Blood Pressure (Admit)  156/90    Blood Pressure (Exercise)  148/80    Blood Pressure (Exit)  125/72    Heart Rate (Admit)  93 bpm    Heart Rate (Exercise)  124 bpm    Heart Rate (Exit)  100 bpm    Oxygen Saturation (Admit)  98 %    Oxygen Saturation (Exercise)  98 %    Oxygen Saturation (Exit)  98 %    Rating of Perceived Exertion (Exercise)  7    Perceived Dyspnea (Exercise)   0    Duration  Continue with 45 min of aerobic exercise without signs/symptoms of physical distress.    Intensity  THRR unchanged      Progression   Progression  Continue to progress workloads to maintain intensity without signs/symptoms of physical distress.      Resistance Training   Training Prescription  Yes    Weight  orange bands    Reps  10-15    Time  10 Minutes      Treadmill   MPH  2.9    Grade  3    Minutes  17      Bike   Level  0.8    Minutes  17      NuStep   Level  4    SPM  80    Minutes  17    METs  2.6       Nutrition:  Target Goals: Understanding of nutrition guidelines, daily intake of sodium <1582m, cholesterol <2093m calories 30% from fat and 7% or less from saturated fats, daily to have 5 or more servings of fruits and vegetables.  Biometrics:    Nutrition Therapy Plan and Nutrition Goals: Nutrition Therapy & Goals - 11/23/18 1144      Nutrition Therapy   Diet  diabetic      Personal Nutrition Goals   Nutrition Goal  Pt to identify and limit food sources of sodium and refined carbohydrates    Personal Goal #2  The pt will consume small frequent meals across the day    Personal Goal #3  Identify food quantities necessary to achieve wt loss of  -2# per week to a goal wt loss of 2.7-10.9 kg (6-24 lb) at graduation from pulmonary rehab.    Personal Goal #4  Pt able to name foods that affect blood glucose       Intervention Plan   Intervention  Prescribe, educate and counsel regarding individualized specific dietary modifications aiming towards targeted core components such as weight, hypertension, lipid management, diabetes, heart failure and other comorbidities.    Expected Outcomes  Short Term Goal: Understand basic principles of dietary content, such as calories, fat, sodium, cholesterol and nutrients.;Long Term Goal: Adherence to prescribed nutrition plan.       Nutrition Assessments: Nutrition Assessments - 11/23/18 1152      Rate  Your Plate Scores   Pre Score  38       Nutrition Goals Re-Evaluation: Nutrition Goals Re-Evaluation    Row Name 11/23/18 1144             Goals   Current Weight  162 lb 11.2 oz (73.8 kg)          Nutrition Goals Discharge (Final Nutrition Goals Re-Evaluation): Nutrition Goals Re-Evaluation - 11/23/18 1144      Goals   Current Weight  162 lb 11.2 oz (73.8 kg)       Psychosocial: Target Goals: Acknowledge presence or absence of significant depression and/or stress, maximize coping skills, provide positive support system. Participant is able to verbalize types and ability  to use techniques and skills needed for reducing stress and depression.  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  Yes    Comments  freinds, church family and extended family      Barriers   Psychosocial barriers to participate in program  The patient should benefit from training in stress management and relaxation.      Screening Interventions   Interventions  Encouraged to exercise    Expected Outcomes  Long Term Goal: Stressors or current issues are controlled or eliminated.;Short Term goal: Identification and review with participant of any Quality of Life or Depression concerns found by scoring the questionnaire.;Long Term goal: The participant improves quality of Life and PHQ9 Scores as seen by post scores and/or verbalization of changes       Quality of Life Scores:  Scores of 19 and below usually indicate a poorer quality of life in these areas.  A difference of  2-3 points is a clinically meaningful difference.  A difference of 2-3 points in the total score of the Quality of Life Index has been associated with significant improvement in  overall quality of life, self-image, physical symptoms, and general health in studies assessing change in quality of life.  PHQ-9: Recent Review Flowsheet Data    Depression screen [facility] 2/9 10/26/2018 10/24/2018 07/21/2017   Decreased Interest 1 1 0   Down, Depressed, Hopeless 1 1 0   PHQ - 2 Score 2 2 0   Altered sleeping 0 0 -   Tired, decreased energy 1 0 -   Change in appetite 0 0 -   Feeling bad or failure about yourself  0 0 -   Trouble concentrating 1 1 -   Moving slowly or fidgety/restless 0 0 -   Suicidal thoughts 0 0 -   PHQ-9 Score 4 3 -   Difficult doing work/chores Not difficult at all - -     Interpretation of Total Score  Total Score Depression Severity:  1-4 = Minimal depression, 5-9 = Mild depression, 10-14 = Moderate depression, 15-19 = Moderately severe depression, 20-27 = Severe depression   Psychosocial Evaluation and Intervention: Psychosocial Evaluation - 01/02/19 1447      Psychosocial Evaluation & Interventions   Interventions  Stress management education;Encouraged to exercise with the program and follow exercise prescription;Relaxation education    Comments  Pt has set a retirement date for next year.  This has decreased her stress level at work.  Pt can see the "light at the end of the tunnel".    Expected Outcomes  Pt will develop and utilize effective and positive coping skills.    Continue Psychosocial Services   Follow up required by staff       Psychosocial Re-Evaluation: Psychosocial Re-Evaluation    [place] Name 12/05/18 1630 01/02/19 1448           Psychosocial Re-Evaluation   Current issues with  Current Stress Concerns  Current Stress Concerns      Comments  Pt finds her job stressful, pt plans to retire next year.  Pt finds her job stressful, pt plans to retire this year.      Expected Outcomes  Pt will develop  positive and healthy coping skills  Pt will develop positive and healthy coping skills for her work related stress.       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    Expected Outcomes  Pt will develop positive and healthy coping skills for her work related stress.    Interventions  Encouraged to attend Pulmonary Rehabilitation for the exercise;Stress management education;Relaxation education    Continue Psychosocial Services   Follow up required by staff       Education: Education Goals: Education classes will be provided on a weekly basis, covering required topics. Participant will state understanding/return demonstration of topics presented.  Learning Barriers/Preferences: Learning Barriers/Preferences - 10/26/18 1004      Learning Barriers/Preferences   Learning Barriers  Sight    Learning Preferences  Written Material;Computer/Internet;Group Instruction;Individual Instruction       Education Topics: Risk Factor Reduction:  -Group instruction that is supported by a PowerPoint presentation. Instructor discusses the definition of a risk factor, different risk factors for pulmonary disease, and how the heart and lungs work together.     Nutrition for Pulmonary Patient:  -Group instruction provided by PowerPoint slides, verbal discussion, and written materials to support subject matter. The instructor gives an explanation and review of  healthy diet recommendations, which includes a discussion on weight management, recommendations for fruit and vegetable consumption, as well as protein, fluid, caffeine, fiber, sodium, sugar, and alcohol. Tips for eating when patients are short of breath are discussed.   Pursed Lip Breathing:  -Group instruction that is supported by demonstration and informational handouts. Instructor discusses the benefits of pursed lip and diaphragmatic breathing and detailed demonstration on how to preform both.     Oxygen Safety:  -Group instruction provided by PowerPoint, verbal discussion, and written material to support subject matter. There is an overview of "What is Oxygen" and "Why do we need it".  Instructor also reviews how to create a safe environment for oxygen use, the importance of using oxygen as prescribed, and the risks of noncompliance. There is a brief discussion on traveling with oxygen and resources the patient may utilize.   Oxygen Equipment:  -Group instruction provided by [facility] Staff utilizing handouts, written materials, and equipment demonstrations.   Signs and Symptoms:  -Group instruction provided by written material and verbal discussion to support subject matter. Warning signs and symptoms of infection, stroke, and heart attack are reviewed and when to call the physician/911 reinforced. Tips for preventing the spread of infection discussed.   Advanced Directives:  -Group instruction provided by verbal instruction and written material to support subject matter. Instructor reviews Advanced Directive laws and proper instruction for filling out document.   Pulmonary Video:  -Group video education that reviews the importance of medication and oxygen compliance, exercise, good  nutrition, pulmonary hygiene, and pursed lip and diaphragmatic breathing for the pulmonary patient.   Exercise for the Pulmonary Patient:  -Group instruction that is supported by a PowerPoint  presentation. Instructor discusses benefits of exercise, core components of exercise, frequency, duration, and intensity of an exercise routine, importance of utilizing pulse oximetry during exercise, safety while exercising, and options of places to exercise outside of rehab.     Pulmonary Medications:  -Verbally interactive group education provided by instructor with focus on inhaled medications and proper administration.   Anatomy and Physiology of the Respiratory System and Intimacy:  -Group instruction provided by PowerPoint, verbal discussion, and written material to support subject matter. Instructor reviews respiratory cycle and anatomical components of the respiratory system and their functions. Instructor also reviews differences in obstructive and restrictive respiratory diseases with examples of each. Intimacy, Sex, and Sexuality differences are reviewed with a discussion on how relationships can change when diagnosed with pulmonary disease. Common sexual concerns are reviewed.   MD DAY -A group question and answer session with a medical doctor that allows participants to ask questions that relate to their pulmonary disease state.   OTHER EDUCATION -Group or individual verbal, written, or video instructions that support the educational goals of the pulmonary rehab program.   PULMONARY REHAB OTHER RESPIRATORY from 12/06/2018 in [place]  Date  -- [[facility]]  Educator  Keyona Emrich  Instruction Review Code  1- Verbalizes Understanding      Holiday Eating Survival Tips:  -Group instruction provided by PowerPoint slides, verbal discussion, and written materials to support subject matter. The instructor gives patients tips, tricks, and techniques to help them not only survive but enjoy the holidays despite the onslaught of food that accompanies the holidays.   PULMONARY REHAB OTHER RESPIRATORY from 12/06/2018 in [place]  Date  12/06/18 [How to beat a sedentary lifestyle]  Educator  -- [[place]]  Instruction Review Code  1- Verbalizes Understanding      Knowledge Questionnaire Score: Knowledge Questionnaire Score - 10/30/18 1634      Knowledge Questionnaire Score   Pre Score  12/18       Core Components/Risk Factors/Patient Goals at Admission: Personal Goals and Risk Factors at Admission - 10/26/18 1006      Core Components/Risk Factors/Patient Goals on Admission    Weight Management  Yes;Weight Loss    Intervention  Obesity: Provide education and appropriate resources to help participant work on and attain dietary goals.;Weight Management/Obesity: Establish reasonable short term and long term weight goals.;Weight Management: Provide education and appropriate resources to help participant work on and attain dietary goals.;Weight Management: Develop a combined nutrition and exercise program designed to reach desired caloric intake, while maintaining appropriate intake of nutrient and fiber, sodium and fats, and appropriate energy expenditure required for the weight goal.    Admit Weight  158 lb 15.2 oz (72.1 kg)    Goal Weight: Short Term  150 lb (68 kg)    Goal Weight: Long Term  140 lb (63.5 kg)    Expected Outcomes  Weight Loss: Understanding of general recommendations for a balanced deficit meal plan, which promotes 1-2 lb weight loss per week and includes a negative energy balance of [phone] kcal/d;Long Term: Adherence to nutrition and physical activity/exercise program aimed toward attainment of established weight goal;Short Term: Continue to assess and modify interventions until short term weight is achieved;Understanding recommendations for meals to include 15-35% energy as protein, 25-35% energy from fat,  35-60% energy from carbohydrates, less than 242m of dietary cholesterol, 20-35 gm of total fiber daily;Understanding of distribution of calorie intake throughout the day with the consumption  of 4-5 meals/snacks    Diabetes  Yes    Intervention  Provide education about signs/symptoms and action to take for hypo/hyperglycemia.;Provide education about proper nutrition, including hydration, and aerobic/resistive exercise prescription along with prescribed medications to achieve blood glucose in normal ranges: Fasting glucose 65-99 mg/dL    Expected Outcomes  Short Term: Participant verbalizes understanding of the signs/symptoms and immediate care of hyper/hypoglycemia, proper foot care and importance of medication, aerobic/resistive exercise and nutrition plan for blood glucose control.;Long Term: Attainment of HbA1C < 7%.    Hypertension  Yes    Intervention  Provide education on lifestyle modifcations including regular physical activity/exercise, weight management, moderate sodium restriction and increased consumption of fresh fruit, vegetables, and low fat dairy, alcohol moderation, and smoking cessation.;Monitor prescription use compliance.    Expected Outcomes  Long Term: Maintenance of blood pressure at goal levels.;Short Term: Continued assessment and intervention until BP is < 140/954mHG in hypertensive participants. < 130/803mG in hypertensive participants with diabetes, heart failure or chronic kidney disease.    Lipids  Yes    Intervention  Provide education and support for participant on nutrition & aerobic/resistive exercise along with prescribed medications to achieve LDL <5m29mDL >40mg61m Expected Outcomes  Short Term: Participant states understanding of desired cholesterol values and is compliant with medications prescribed. Participant is following exercise prescription and nutrition guidelines.;Long Term: Cholesterol controlled with medications as prescribed, with individualized exercise RX and with personalized nutrition plan. Value goals: LDL < 5mg,79m > 40 mg.    Stress  Yes    Intervention  Offer individual and/or small group education and counseling on adjustment to  heart disease, stress management and health-related lifestyle change. Teach and support self-help strategies.    Expected Outcomes  Short Term: Participant demonstrates changes in health-related behavior, relaxation and other stress management skills, ability to obtain effective social support, and compliance with psychotropic medications if prescribed.;Long Term: Emotional wellbeing is indicated by absence of clinically significant psychosocial distress or social isolation.       Core Components/Risk Factors/Patient Goals Review:  Goals and Risk Factor Review    Row Name 12/05/18 1633 01/02/19 1449           Core Components/Risk Factors/Patient Goals Review   Personal Goals Review  Weight Management/Obesity;Increase knowledge of respiratory medications and ability to use respiratory devices properly.;Diabetes;Stress;Improve shortness of breath with ADL's;Develop more efficient breathing techniques such as purse lipped breathing and diaphragmatic breathing and practicing self-pacing with activity.;Hypertension  Weight Management/Obesity;Increase knowledge of respiratory medications and ability to use respiratory devices properly.;Diabetes;Stress;Improve shortness of breath with ADL's;Develop more efficient breathing techniques such as purse lipped breathing and diaphragmatic breathing and practicing self-pacing with activity.      Review  Pt has completed 5 exercise sessions and 2 education classess. Pt with weight gain of 1.7 kg more than likely due to holiday eating.  Pt will meet with the dietician soon to discuss weight loss strategies.  Pt with improvement of her gout flare up due to steriods.   Pt sress level remains unchanged however her tolerance and reaciton has changed.  Pt has seleceted a retirement date and feels she can see the light at the end of the tunnel.   Pt workloads remain level .6 o the airydyne, increase on the treadmill to 2.0/2.0 and  increase to level 4 on the nustep.  pt  reports within acceptable limits and feels her lipid management is appropriate.  Will resolve this goal.  Pt brings her meter to check pre and post blood glucose.  Pt does not take her insulin prior to exercise and this has helped with hypoglycemic episodes.  Pt pre exercie bp remain slightly above goal hosever post exercise bp readings are acceptable.  Continue to monitor and may be able to resolve this goal on next reveiw.  Pt has completed 7 exercise sessions and 3 education classess.  Pt last exercised on 12/11/18.  Pt absent due to "flare up".  Will plan to extend her time or have her discharge and return when able. Pt with weight gain of 1.6 kg most likely from prednisone therapy with flare around Thanksgiving.   Pt sress level remains unchanged however her tolerance and reaction has changed.  Pt has seleceted a retirement date and feels she can see the light at the end of the tunnel.   Pt workloads increase to  level .8 o the airydyne, increase on the treadmill to 2.9/3.0 and level 4 on the nustep.  Pt brings her meter to check pre and post blood glucose.  Pt does not take her insulin prior to exercise and this has helped with hypoglycemic episodes.  Pt pre exercie bp remain slightly above goal (due to holding insulin prior to exercise) however post exercise bp readings are acceptable.      Expected Outcomes  See Admission Goals and Outcomes  See Admission Goals and Outcomes         Core Components/Risk Factors/Patient Goals at Discharge (Final Review):  Goals and Risk Factor Review - 01/02/19 1449      Core Components/Risk Factors/Patient Goals Review   Personal Goals Review  Weight Management/Obesity;Increase knowledge of respiratory medications and ability to use respiratory devices properly.;Diabetes;Stress;Improve shortness of breath with ADL's;Develop more efficient breathing techniques such as purse lipped breathing and diaphragmatic breathing and practicing self-pacing with activity.     Review  Pt has completed 7 exercise sessions and 3 education classess.  Pt last exercised on 12/11/18.  Pt absent due to "flare up".  Will plan to extend her time or have her discharge and return when able. Pt with weight gain of 1.6 kg most likely from prednisone therapy with flare around Thanksgiving.   Pt sress level remains unchanged however her tolerance and reaction has changed.  Pt has seleceted a retirement date and feels she can see the light at the end of the tunnel.   Pt workloads increase to  level .8 o the airydyne, increase on the treadmill to 2.9/3.0 and level 4 on the nustep.  Pt brings her meter to check pre and post blood glucose.  Pt does not take her insulin prior to exercise and this has helped with hypoglycemic episodes.  Pt pre exercie bp remain slightly above goal (due to holding insulin prior to exercise) however post exercise bp readings are acceptable.    Expected Outcomes  See Admission Goals and Outcomes       ITP Comments: ITP Comments    Row Name 10/26/18 0933 12/05/18 1628 01/02/19 1445       ITP Comments  Dr. Manfred Arch, Medical Director Pulmonary Rehab  Dr. Manfred Arch, Medical Director Pulmonary Rehab  Dr. Manfred Arch, Medical Director Pulmonary Rehab        Comments:  ITP REVIEW Pt is making expected progress toward personal goals after  completing  7 sessions.   Recommend continued exercise, life style modification, education, and utilization of breathing texhniques to increase stamina and strength and decrease shortness of breath with exertion. Cherre Huger, BSN Cardiac and Training and development officer

## 2019-01-03 ENCOUNTER — Encounter (HOSPITAL_COMMUNITY): Payer: 59

## 2019-01-03 ENCOUNTER — Telehealth (HOSPITAL_COMMUNITY): Payer: Self-pay

## 2019-01-07 ENCOUNTER — Other Ambulatory Visit (HOSPITAL_COMMUNITY): Payer: Self-pay | Admitting: Medical

## 2019-01-08 ENCOUNTER — Encounter (HOSPITAL_COMMUNITY): Payer: 59

## 2019-01-08 ENCOUNTER — Telehealth: Payer: Self-pay | Admitting: Medical

## 2019-01-08 NOTE — Telephone Encounter (Signed)
Is this ok to refill?  

## 2019-01-08 NOTE — Telephone Encounter (Signed)
I received pain med refill request on Tramadol.   She saw orthopedics since I last saw her to discuss her pains and carpal tunnel. What did they decide in terms of treating her pain?

## 2019-01-09 ENCOUNTER — Other Ambulatory Visit: Payer: Self-pay | Admitting: Medical

## 2019-01-09 MED ORDER — TRAMADOL HCL 50 MG PO TABS: 50.0000 mg | ORAL_TABLET | Freq: Two times a day (BID) | ORAL | 0 refills | Status: DC | PRN

## 2019-01-09 NOTE — Telephone Encounter (Signed)
Med sent.

## 2019-01-09 NOTE — Telephone Encounter (Signed)
Patient states that it is for the pain in her side not her arms.   She will call ortho to see if they will give her pain meds.

## 2019-01-09 NOTE — Telephone Encounter (Signed)
Left message on voicemail for patient to call back. 

## 2019-01-10 ENCOUNTER — Encounter (HOSPITAL_COMMUNITY): Payer: 59

## 2019-01-11 ENCOUNTER — Telehealth (HOSPITAL_COMMUNITY): Payer: Self-pay

## 2019-01-15 ENCOUNTER — Encounter (HOSPITAL_COMMUNITY): Payer: 59

## 2019-01-17 ENCOUNTER — Encounter (HOSPITAL_COMMUNITY): Payer: 59

## 2019-01-22 ENCOUNTER — Encounter (HOSPITAL_COMMUNITY): Payer: 59

## 2019-01-24 ENCOUNTER — Ambulatory Visit: Payer: 59 | Admitting: Medical

## 2019-01-24 ENCOUNTER — Encounter: Payer: Self-pay | Admitting: Medical

## 2019-01-24 ENCOUNTER — Encounter (HOSPITAL_COMMUNITY): Payer: 59

## 2019-01-24 VITALS — BP 130/80 | HR 80 | Temp 97.9°F | Resp 16 | Ht 64.0 in | Wt 172.0 lb

## 2019-01-24 DIAGNOSIS — M533 Sacrococcygeal disorders, not elsewhere classified: Secondary | ICD-10-CM | POA: Diagnosis not present

## 2019-01-24 NOTE — Progress Notes (Signed)
Subjective: Chief Complaint  Patient presents with  . pain    pain in tailbone X 2 weeks    Here for pain in tailbone x 2 weeks.  Denies fall, injury, trauma, no fever.  No pain with defecation, no pain with urination.  Denies blood in the urine or stool.  Denies any bowel changes, no urinary changes, no frequency, no abdominal pain, no back pain no pain in the buttocks.  She notes that she has pains in general in her legs and arms and basically all over but this is a new pain.  She denies weight loss, night sweats.  No triggers.    Last pap years ago.  S/p complete hysterectomy.  No other aggravating or relieving factors.  Past Medical History:  Diagnosis Date  . 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 Biceps tendonitis 2/12   Dr. Gladstone Lighter  . History of blood transfusion 1988   "related to hysterectomy" (09/27/2018)  . History of mammogram 05/25/12   stable, no suspicious finding, extremely dense breast tissue  . Hyperlipidemia   . Hypertension   . Neutropenia, unspecified ([place]) 02/2013   slight, likely ethnicity related  . Sarcoidosis    "lung, liver, eyes, skin"   . Seasonal allergies   . Shortness of breath dyspnea    with exertion   . Supraspinatus tendonitis 2/12   Dr. Gladstone Lighter  . Type II diabetes mellitus ([place])   . Wears glasses    Current Outpatient Medications on File Prior to Visit  Medication Sig Dispense Refill  . amLODipine (NORVASC) 10 MG tablet Take 1 tablet (10 mg total) by mouth daily. 90 tablet 3  . azaTHIOprine (IMURAN) 50 MG tablet Take 50 mg by mouth daily.    Marland Kitchen BAYER CONTOUR TEST test strip TEST BLOOD SUGAR TWICE DAILY 100 each 0  . blood glucose meter kit and supplies KIT Dispense based on patient and insurance preference. Use up to four times daily as directed. (FOR ICD-9 250.00, 250.01). 1 each 0  . cholecalciferol (VITAMIN D) 1000 units tablet Take 1 tablet (1,000 Units total)  by mouth daily. 90 tablet 3  . glucose blood (ONE TOUCH ULTRA TEST) test strip USE AS DIRECTED 100 each 2  . insulin aspart protamine - aspart (NOVOLOG MIX 70/30 FLEXPEN) (70-30) 100 UNIT/ML FlexPen Inject 12 Units into the skin 2 (two) times daily.    . Insulin Pen Needle (BD PEN NEEDLE NANO U/F) 32G X 4 MM MISC INJECT THREE TIMES DAILY AS DIRECTED 100 each 11  . Lancets 30G MISC Test blood sugars twice daily 100 each 2  . lisinopril-hydrochlorothiazide (PRINZIDE,ZESTORETIC) 20-25 MG tablet TAKE 1 TABLET BY MOUTH DAILY 90 tablet 0  . metFORMIN (GLUCOPHAGE-XR) 500 MG 24 hr tablet Take 1 tablet (500 mg total) by mouth daily with breakfast. 30 tablet 2  . PEPPERMINT OIL PO Take 1 drop by mouth See admin instructions. Place 1 drop into 12-16 ounces of water and drink as needed for nausea or headaches    . traMADol (ULTRAM) 50 MG tablet Take 1 tablet (50 mg total) by mouth every 12 (twelve) hours as needed (pain). 30 tablet 0   No current facility-administered medications on file prior to visit.    Past Surgical History:  Procedure Laterality Date  . ABDOMINAL HYSTERECTOMY  1990   and BSO; endometriosis  . ANTERIOR CERVICAL DECOMP/DISCECTOMY FUSION N/A 06/20/2013   Procedure: ANTERIOR CERVICAL DECOMPRESSION/DISCECTOMY  FUSION 1 LEVEL;  Surgeon: Ophelia Charter, MD;  Location: [facility];  Service: Neurosurgery;  Laterality: N/A;  C45 anterior cervical decompression with fusion interbody prothesis plating and bonegraft  . BACK SURGERY    . CARPAL TUNNEL RELEASE Right 06/20/2013   Procedure: CARPAL TUNNEL RELEASE;  Surgeon: Ophelia Charter, MD;  Location: [facility];  Service: Neurosurgery;  Laterality: Right;  RIGHT carpal tunnel release  . COLONOSCOPY  03/06/12   sigmoid polyp; repeat 10 years; Dr. Tora Duck  . LUNG SURGERY  1987   endometriosis in lungs  . REDUCTION MAMMAPLASTY  1994  . SUPRACLAVICAL NODE BIOPSY Left 01/14/2016   Procedure: LEFT SUPRACLAVICAL NODE BIOPSY;  Surgeon:  Melrose Nakayama, MD;  Location: [facility];  Service: Thoracic;  Laterality: Left;  . TONSILLECTOMY     ROS as in subjective    Objective BP 130/80   Pulse 80   Temp 97.9 F (36.6 C) (Oral)   Resp 16   Ht _0  (1.626 m)   Wt 172 lb (78 kg)   LMP 12/27/1987   SpO2 99%   BMI 29.52 kg/m   Wt Readings from Last 3 Encounters:  01/24/19 172 lb (78 kg)  12/18/18 162 lb 4.1 oz (73.6 kg)  12/04/18 162 lb 7.7 oz (73.7 kg)    General: Well-developed well-nourished no acute stress, African-American female Anus normal appearing She is tender directly over the coccyx tip, no surrounding erythema no fluctuance no skin lesion, nontender around the anus, buttocks nontender, no indention or obvious abscess or lump or mass, exam chaperoned by nurse Abdomen nontender no mass no organomegaly, no lymphadenopathy    Assessment: Encounter Diagnosis  Name Primary?  . Coccydynia Yes      Plan: We discussed possible causes.  She is quite tender over the coccyx specifically but no other abnormal findings on exam.  Declined pelvic exam today.  Of note she had a CT Angie chest abdomen and pelvis in October at that time there was no abnormalities of the bones or lymph nodes or other masses  I reviewed her labs from September 2019 for CBC and comprehensive metabolic panel  She had a complete hysterectomy back in the 90s.  I advised donut pillow, avoid direct pressure on the coccyx, begin ibuprofen 400 twice daily for the next 5 to 7 days.  She has ibuprofen 800 mg and she will cut these in half.  She will call back in 1 to 2 weeks.  If the symptoms persist then consider calling radiology for next steps  He voices agreement and understanding of plan   Gavina was seen today for pain.  Diagnoses and all orders for this visit:  Coccydynia

## 2019-01-29 ENCOUNTER — Encounter (HOSPITAL_COMMUNITY): Payer: 59

## 2019-01-31 ENCOUNTER — Encounter (HOSPITAL_COMMUNITY): Payer: 59

## 2019-02-01 NOTE — Addendum Note (Signed)
Encounter addended by: Ivonne Andrew, RD on: 02/01/2019 9:24 AM  Actions taken: Flowsheet data copied forward, Visit Navigator Flowsheet section accepted

## 2019-02-05 ENCOUNTER — Encounter (HOSPITAL_COMMUNITY): Payer: 59

## 2019-02-07 ENCOUNTER — Encounter (HOSPITAL_COMMUNITY): Payer: 59

## 2019-02-12 ENCOUNTER — Encounter (HOSPITAL_COMMUNITY): Payer: 59

## 2019-02-25 ENCOUNTER — Encounter: Payer: Self-pay | Admitting: Medical

## 2019-02-25 ENCOUNTER — Ambulatory Visit: Payer: 59 | Admitting: Medical

## 2019-02-25 VITALS — BP 140/90 | HR 106 | Temp 99.1°F | Resp 16 | Wt 165.8 lb

## 2019-02-25 DIAGNOSIS — D72819 Decreased white blood cell count, unspecified: Secondary | ICD-10-CM | POA: Diagnosis not present

## 2019-02-25 DIAGNOSIS — R112 Nausea with vomiting, unspecified: Secondary | ICD-10-CM | POA: Diagnosis not present

## 2019-02-25 DIAGNOSIS — R059 Cough, unspecified: Secondary | ICD-10-CM

## 2019-02-25 DIAGNOSIS — R05 Cough: Secondary | ICD-10-CM | POA: Diagnosis not present

## 2019-02-25 DIAGNOSIS — R6889 Other general symptoms and signs: Secondary | ICD-10-CM | POA: Diagnosis not present

## 2019-02-25 LAB — CBC WITH DIFFERENTIAL/PLATELET
BASOS ABS: 0 10*3/uL (ref 0.0–0.2)
Basos: 1 %
EOS (ABSOLUTE): 0 10*3/uL (ref 0.0–0.4)
Eos: 0 %
HEMATOCRIT: 35.7 % (ref 34.0–46.6)
Hemoglobin: 12.2 g/dL (ref 11.1–15.9)
LYMPHS ABS: 0.6 10*3/uL — AB (ref 0.7–3.1)
Lymphs: 32 %
MCH: 29.7 pg (ref 26.6–33.0)
MCHC: 34.2 g/dL (ref 31.5–35.7)
MCV: 87 fL (ref 79–97)
MONOCYTES: 19 %
Monocytes Absolute: 0.4 10*3/uL (ref 0.1–0.9)
NEUTROS ABS: 0.9 10*3/uL — AB (ref 1.4–7.0)
Neutrophils: 48 %
PLATELETS: 258 10*3/uL (ref 150–450)
RBC: 4.11 x10E6/uL (ref 3.77–5.28)
RDW: 13.7 % (ref 11.7–15.4)
WBC: 1.9 10*3/uL — CL (ref 3.4–10.8)

## 2019-02-25 LAB — BASIC METABOLIC PANEL
BUN / CREAT RATIO: 20 (ref 12–28)
BUN: 21 mg/dL (ref 8–27)
CHLORIDE: 96 mmol/L (ref 96–106)
CO2: 28 mmol/L (ref 20–29)
Calcium: 8.7 mg/dL (ref 8.7–10.3)
Creatinine, Ser: 1.06 mg/dL — ABNORMAL HIGH (ref 0.57–1.00)
GFR calc non Af Amer: 57 mL/min/{1.73_m2} — ABNORMAL LOW (ref >59)
GFR, EST AFRICAN AMERICAN: 66 mL/min/{1.73_m2} (ref >59)
Glucose: 99 mg/dL (ref 65–99)
Potassium: 3.5 mmol/L (ref 3.5–5.2)
Sodium: 137 mmol/L (ref 134–144)

## 2019-02-25 NOTE — Patient Instructions (Addendum)
You currently appear to have flulike symptoms  Most of the time flu symptoms last 7 to 10 days so you should be hopefully approaching the end of this  Recommendations  REST  You need to significantly increase your fluid intake including water, possibly short-term G2 Gatorade, soup broth, Jell-O, etc.  You are dehydrated at the moment  We will check some labs to make sure nothing looks worrisome  If you get worse in the next 2 to 3 days such as fever over 101, weight loss, cannot keep any fluids down, shortness of breath, cough below, worse cough in general, then you will need to be seen again either here or through the emergency department and have a chest x-ray  For the next 2 days I want you to stop your lisinopril HCT since you are dehydrated  If you are much improved with hydration in 2 days, then restart your lisinopril HCT been

## 2019-02-25 NOTE — Addendum Note (Signed)
Addended by: Carlena Hurl on: 02/25/2019 05:10 PM   Modules accepted: Orders

## 2019-02-25 NOTE — Progress Notes (Addendum)
Subjective:     Patient ID: Bethany Webb, female   DOB: [birth year], 58 y.o.   MRN: [id number]  HPI Chief Complaint  Patient presents with  . cough    couch, vomit, diarrhea, not eating, achey, chills X Wednesday   Here for not feeling well.  She reports back pain, chills, eyes sore, cough, some runny nose, nausea, vomiting, some diarrhea.  Has vomited the first day.  Vomited most of the day when symptoms started 6 days ago.   Has had loose stools last week when symptoms began but hasn't eaten much in last few days, no appetite.   Has had subjective fever.    No SOB.   Went to [place] recently, got sick.  Sick the whole time there.    No known contacts with Coronavirus or suspected Coronoviurs, but was around a lot of different people while in [place].     Using [employer].  No other aggravating or relieving factors. No other complaint.  Review of Systems As in subjective     Objective:   Physical Exam BP 140/90   Pulse (!) 106   Temp 99.1 F (37.3 C) (Oral)   Resp 16   Wt 165 lb 12.8 oz (75.2 kg)   LMP 12/27/1987   SpO2 97%   BMI 28.46 kg/m   General: Ill-appearing, well-developed, well-nourished Skin: warm, dry HEENT: Nose inflamed and congested, clear conjunctiva, TMs pearly, no sinus tenderness, pharynx with mild erythema, no exudates Neck: Supple, non tender, shotty cervical adenopathy Heart: Regular rate and rhythm, normal S1, S2, no murmurs Lungs: Clear to auscultation bilaterally, no wheezes, rales, rhonchi Abdomen: Non tender non distended Extremities: Mild generalized tenderness      Assessment:     Encounter Diagnoses  Name Primary?  . Flu-like symptoms Yes  . Cough   . Nausea and vomiting, intractability of vomiting not specified, unspecified vomiting type   . Leukopenia, unspecified type        Plan:     Flu negative swab  Discussed the following with patient  Patient Instructions  You currently appear to have flulike symptoms  Most of the  time flu symptoms last 7 to 10 days so you should be hopefully approaching the end of this  Recommendations  REST  You need to significantly increase your fluid intake including water, possibly short-term G2 Gatorade, soup broth, Jell-O, etc.  You are dehydrated at the moment  We will check some labs to make sure nothing looks worrisome  If you get worse in the next 2 to 3 days such as fever over 101, weight loss, cannot keep any fluids down, shortness of breath, cough below, worse cough in general, then you will need to be seen again either here or through the emergency department and have a chest x-ray  For the next 2 days I want you to stop your lisinopril HCT since you are dehydrated  If you are much improved with hydration in 2 days, then restart your lisinopril HCT been  Bethany Webb was seen today for cough.  Diagnoses and all orders for this visit:  Flu-like symptoms -     Basic metabolic panel -     CBC with Differential/Platelet -     Pulse oximetry (single); Future -     DG Chest 2 View; Future  Cough -     Basic metabolic panel -     CBC with Differential/Platelet -     Pulse oximetry (single); Future -  DG Chest 2 View; Future  Nausea and vomiting, intractability of vomiting not specified, unspecified vomiting type -     Basic metabolic panel -     CBC with Differential/Platelet -     Pulse oximetry (single); Future  Leukopenia, unspecified type    F/u if worse or not improving in the next 3-4 days.

## 2019-02-27 ENCOUNTER — Ambulatory Visit
Admission: RE | Admit: 2019-02-27 | Discharge: 2019-02-27 | Disposition: A | Discharge status: Home / Self Care | Payer: 59 | Source: Ambulatory Visit | Attending: Medical | Admitting: Medical

## 2019-02-27 DIAGNOSIS — R05 Cough: Secondary | ICD-10-CM | POA: Diagnosis not present

## 2019-02-27 DIAGNOSIS — R059 Cough, unspecified: Secondary | ICD-10-CM

## 2019-02-27 DIAGNOSIS — R6889 Other general symptoms and signs: Secondary | ICD-10-CM

## 2019-03-01 ENCOUNTER — Encounter: Payer: Self-pay | Admitting: Medical

## 2019-03-01 ENCOUNTER — Ambulatory Visit (INDEPENDENT_AMBULATORY_CARE_PROVIDER_SITE_OTHER): Payer: 59 | Admitting: Medical

## 2019-03-01 VITALS — BP 118/80 | HR 85 | Temp 98.1°F | Ht 64.0 in | Wt 165.8 lb

## 2019-03-01 DIAGNOSIS — D862 Sarcoidosis of lung with sarcoidosis of lymph nodes: Secondary | ICD-10-CM

## 2019-03-01 DIAGNOSIS — E1169 Type 2 diabetes mellitus with other specified complication: Secondary | ICD-10-CM

## 2019-03-01 DIAGNOSIS — I10 Essential (primary) hypertension: Secondary | ICD-10-CM | POA: Diagnosis not present

## 2019-03-01 DIAGNOSIS — I152 Hypertension secondary to endocrine disorders: Secondary | ICD-10-CM

## 2019-03-01 DIAGNOSIS — E785 Hyperlipidemia, unspecified: Secondary | ICD-10-CM

## 2019-03-01 DIAGNOSIS — Z79899 Other long term (current) drug therapy: Secondary | ICD-10-CM

## 2019-03-01 DIAGNOSIS — D8689 Sarcoidosis of other sites: Secondary | ICD-10-CM

## 2019-03-01 DIAGNOSIS — D72819 Decreased white blood cell count, unspecified: Secondary | ICD-10-CM

## 2019-03-01 DIAGNOSIS — E118 Type 2 diabetes mellitus with unspecified complications: Secondary | ICD-10-CM

## 2019-03-01 DIAGNOSIS — J929 Pleural plaque without asbestos: Secondary | ICD-10-CM

## 2019-03-01 DIAGNOSIS — E1159 Type 2 diabetes mellitus with other circulatory complications: Secondary | ICD-10-CM | POA: Diagnosis not present

## 2019-03-01 DIAGNOSIS — R6889 Other general symptoms and signs: Secondary | ICD-10-CM

## 2019-03-01 NOTE — Progress Notes (Signed)
Subjective:     Patient ID: Bethany Webb, female   DOB: 10-29-1961, 58 y.o.   MRN: [id number]  HPI Chief Complaint  Patient presents with  . Follow-up   Medical team Dr. Lottie Webb, sarcoid specialist, [place], [place] doctor, Dr. Idolina Webb Dentist, Dr. Warner Webb GI, Dr. Domenic Webb, Bethany Eng, PA-C here for primary dare  Here for f/u on flu like symptoms.   I saw her 02/25/19 and was quite sick that day.   She feels 85% better now.  Still has some cough, but most symptoms have resolved.  She is also here to recheck on her low WBC counts from recent visit.     She sees her sarcoidosis specialist again in [place] on 04/12/2019  No new c/o today.  No other aggravating or relieving factors. No other complaint.  Review of Systems As in subjective  Past Medical History:  Diagnosis Date  . 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 Biceps tendonitis 2/12   Dr. Gladstone Lighter  . History of blood transfusion 1988   "related to hysterectomy" (09/27/2018)  . History of mammogram 05/25/12   stable, no suspicious finding, extremely dense breast tissue  . Hyperlipidemia   . Hypertension   . Neutropenia, unspecified ([place]) 02/2013   slight, likely ethnicity related  . Sarcoidosis    "lung, liver, eyes, skin"   . Seasonal allergies   . Shortness of breath dyspnea    with exertion   . Supraspinatus tendonitis 2/12   Dr. Gladstone Lighter  . Type II diabetes mellitus ([place])   . Wears glasses    Current Outpatient Medications on File Prior to Visit  Medication Sig Dispense Refill  . amLODipine (NORVASC) 10 MG tablet Take 1 tablet (10 mg total) by mouth daily. 90 tablet 3  . azaTHIOprine (IMURAN) 50 MG tablet Take 50 mg by mouth daily.    . blood glucose meter kit and supplies KIT Dispense based on patient and insurance preference. Use up to four times daily as directed. (FOR ICD-9 250.00, 250.01). 1 each 0  . cholecalciferol  (VITAMIN D) 1000 units tablet Take 1 tablet (1,000 Units total) by mouth daily. 90 tablet 3  . glucose blood (ONE TOUCH ULTRA TEST) test strip USE AS DIRECTED 100 each 2  . HUMALOG MIX 75/25 KWIKPEN (75-25) 100 UNIT/ML Kwikpen Inject 5 Units into the skin 3 (three) times daily.    Marland Kitchen ibuprofen (ADVIL,MOTRIN) 800 MG tablet     . insulin aspart protamine - aspart (NOVOLOG MIX 70/30 FLEXPEN) (70-30) 100 UNIT/ML FlexPen Inject 12 Units into the skin 2 (two) times daily.    . Insulin Pen Needle (BD PEN NEEDLE NANO U/F) 32G X 4 MM MISC INJECT THREE TIMES DAILY AS DIRECTED 100 each 11  . Lancets 30G MISC Test blood sugars twice daily 100 each 2  . lisinopril-hydrochlorothiazide (PRINZIDE,ZESTORETIC) 20-25 MG tablet TAKE 1 TABLET BY MOUTH DAILY 90 tablet 0  . metFORMIN (GLUCOPHAGE-XR) 500 MG 24 hr tablet Take 1 tablet (500 mg total) by mouth daily with breakfast. 30 tablet 2  . PEPPERMINT OIL PO Take 1 drop by mouth See admin instructions. Place 1 drop into 12-16 ounces of water and drink as needed for nausea or headaches    . traMADol (ULTRAM) 50 MG tablet Take 1 tablet (50 mg total) by mouth every 12 (twelve) hours as needed (pain). (Patient not taking: Reported on 02/25/2019) 30 tablet 0  No current facility-administered medications on file prior to visit.        Objective:   Physical Exam BP 118/80   Pulse 85   Temp 98.1 F (36.7 C) (Oral)   Ht _0  (1.626 m)   Wt 165 lb 12.8 oz (75.2 kg)   LMP 12/27/1987   SpO2 98%   BMI 28.46 kg/m    Wt Readings from Last 3 Encounters:  03/01/19 165 lb 12.8 oz (75.2 kg)  02/25/19 165 lb 12.8 oz (75.2 kg)  01/24/19 172 lb (78 kg)   General appearance: alert, no distress, WD/WN,  HEENT: normocephalic, sclerae anicteric, TMs pearly, nares patent, no discharge or erythema, pharynx normal Oral cavity: MMM, no lesions Neck: supple, no lymphadenopathy, no thyromegaly, no masses Heart: RRR, normal S1, S2, no murmurs Lungs: CTA bilaterally, no wheezes,  rhonchi, or rales Abdomen: +bs, soft, non tender, non distended, no masses, no hepatomegaly, no splenomegaly Pulses: 2+ symmetric, upper and lower extremities, normal cap refill       Assessment:     Encounter Diagnoses  Name Primary?  . Hypertension associated with diabetes ([place]) Yes  . Pleural plaque   . 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 Lacrimal and parotid gland sarcoidosis   . Diabetes mellitus with complication ([place])   . Hyperlipidemia associated with type 2 diabetes mellitus ([place])   . Leukopenia, unspecified type   . High risk medication use   . Flu-like symptoms        Plan:      Leukopenia- for many years the white counts were mildly decreased with relatively normal neutrophil counts, but since May 2019 her white counts and neutrophil numbers have gotten lower.  Not sure if this is related to the azathioprine medication or not.  This medication is managed by her sarcoid doctor in [place].  For years we just assume her mildly decreased white counts were ethnicity related.  Referral to hematology for consult  She is fasting today so we will go ahead and check a lipid and A1c  She is compliant with her medications  Glad to see she is much improved from her flulike illness this week when I saw her a few days ago  Bethany Webb was seen today for follow-up.  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

## 2019-03-01 NOTE — Patient Instructions (Addendum)
Please check with your sarcoidosis doctor as I think your medicine azathioprine may be causing your white counts to drop.  You have had a long history of mildly decreased white counts but in the last 6 months these numbers have been significantly lower  We will refer you to hematology for consult on white counts anyhow because you have had low white counts for many years in general   Leukopenia Leukopenia is a condition in which you have a low number of white blood cells. White blood cells help the body to fight infections. The number of white blood cells in the body varies from person to person. There are five types of white blood cells. Two types (lymphocytes and neutrophils) make up most of the white blood cell count. When lymphocytes are low, the condition is called lymphocytopenia. When neutrophils are low, it is called neutropenia. Neutropenia is the most dangerous type of leukopenia because it can lead to dangerous infections. What are the causes? This condition is commonly caused by damage to soft tissue inside of the bones (bone marrow), which is where most white blood cells are made. Bone marrow can get damaged by:  Medicine or X-ray treatments for cancer (chemotherapy or radiation therapy).  Serious infections.  Cancer of the white blood cells (leukemia, lymphoma, or myeloma).  Medicines, including: ? Certain antibiotics. ? Certain heart medicines. ? Steroids. ? Certain medicines used to treat diseases of the immune system (autoimmune diseases), like rheumatoid arthritis. Leukopenia also happens when white blood cells are destroyed after leaving the bone marrow, which may result from:  Liver disease.  Autoimmune disease.  Vitamin B deficiencies. What are the signs or symptoms? One of the most common signs of leukopenia, especially severe neutropenia, is having a lot of bacterial infections. Different infections have different symptoms. An infection in your lungs may cause  coughing. A urinary tract infection may cause frequent urination and a burning sensation. You may also get infections of the blood, skin, rectum, throat, sinuses, or ears. Some people have no symptoms. If you do have symptoms, they may include:  Fever.  Fatigue.  Swollen glands (lymph nodes).  Painful mouth ulcers.  Gum disease. How is this diagnosed? This condition may be diagnosed based on:  Your medical history.  A physical exam to check for swollen lymph nodes and an enlarged spleen. Your spleen is an organ on the left side of your body that stores white blood cells.  Tests, such as: ? A complete blood count. This blood test counts each type of white cell. ? Bone marrow aspiration. Some bone marrow is removed to be checked under a microscope. ? Lymph node biopsy. Some lymph node tissue is removed to be checked under a microscope. ? Other types of blood tests or imaging tests. How is this treated? Treatment of leukopenia depends on the cause. Some common treatments include:  Antibiotic medicine to treat bacterial infections.  Stopping medicines that may cause leukopenia.  Medicines to stimulate neutrophil production (hematopoietic growth factors), to treat neutropenia. Follow these instructions at home:  Take over-the-counter and prescription medicines only as told by your health care provider. This includes supplements and vitamins.  If you were prescribed an antibiotic medicine, take it as told by your health care provider. Do not stop taking the antibiotic even if you start to feel better.  Preventing infection is important if you have leukopenia. To prevent infection: ? Avoid close contact with sick people. ? Wash your hands frequently with soap and water.  If soap and water are not available, use hand sanitizer. ? Do not eat uncooked or undercooked meats. ? Wash fruits and vegetables before eating them. ? Do not eat or drink unpasteurized dairy products. ? Get  regular dental care, and maintain good dental hygiene. You should visit the dentist at least once every 6 months.  Keep all follow-up visits as told by your health care provider. This is important. Contact a health care provider if:  You have chills or a fever.  You have symptoms of an infection. Get help right away if:  You have a fever that lasts for more than 2-3 days.  You have symptoms that last for more than 2-3 days.  You have trouble breathing.  You have chest pain. This information is not intended to replace advice given to you by your health care provider. Make sure you discuss any questions you have with your health care provider. Document Released: 12/17/2013 Document Revised: 11/01/2016 Document Reviewed: 11/01/2016 Elsevier Interactive Patient Education  2019 [employer].

## 2019-03-02 LAB — LIPID PANEL
CHOLESTEROL TOTAL: 213 mg/dL — AB (ref 100–199)
Chol/HDL Ratio: 6.3 ratio — ABNORMAL HIGH (ref 0.0–4.4)
HDL: 34 mg/dL — AB (ref >39)
LDL Calculated: 164 mg/dL — ABNORMAL HIGH (ref 0–99)
Triglycerides: 77 mg/dL (ref 0–149)
VLDL CHOLESTEROL CAL: 15 mg/dL (ref 5–40)

## 2019-03-02 LAB — HEPATIC FUNCTION PANEL
ALBUMIN: 4.2 g/dL (ref 3.8–4.9)
ALT: 17 IU/L (ref 0–32)
AST: 36 IU/L (ref 0–40)
Alkaline Phosphatase: 107 IU/L (ref 39–117)
BILIRUBIN TOTAL: 0.5 mg/dL (ref 0.0–1.2)
Bilirubin, Direct: 0.15 mg/dL (ref 0.00–0.40)
Total Protein: 6.6 g/dL (ref 6.0–8.5)

## 2019-03-02 LAB — HEMOGLOBIN A1C
Est. average glucose Bld gHb Est-mCnc: 128 mg/dL
HEMOGLOBIN A1C: 6.1 % — AB (ref 4.8–5.6)

## 2019-03-02 LAB — MICROALBUMIN / CREATININE URINE RATIO
CREATININE, UR: 113.3 mg/dL
MICROALB/CREAT RATIO: 10 mg/g{creat} (ref 0–29)
Microalbumin, Urine: 11 ug/mL

## 2019-03-04 ENCOUNTER — Other Ambulatory Visit: Payer: Self-pay | Admitting: Medical

## 2019-03-04 ENCOUNTER — Other Ambulatory Visit: Payer: Self-pay

## 2019-03-04 MED ORDER — INSULIN PEN NEEDLE 32G X 4 MM MISC: 11 refills | Status: AC

## 2019-03-04 MED ORDER — HUMALOG MIX 75/25 KWIKPEN (75-25) 100 UNIT/ML ~~LOC~~ SUPN: 12.0000 [IU] | PEN_INJECTOR | Freq: Three times a day (TID) | SUBCUTANEOUS | 2 refills | Status: DC

## 2019-03-04 MED ORDER — METFORMIN HCL ER 500 MG PO TB24: 500.0000 mg | ORAL_TABLET | Freq: Every day | ORAL | 1 refills | Status: AC

## 2019-03-04 MED ORDER — GLUCOSE BLOOD VI STRP: ORAL_STRIP | 2 refills | Status: DC

## 2019-03-06 ENCOUNTER — Other Ambulatory Visit: Payer: Self-pay | Admitting: Medical

## 2019-03-12 ENCOUNTER — Telehealth: Payer: Self-pay | Admitting: Hematology

## 2019-03-12 NOTE — Progress Notes (Signed)
Discharge Progress Report  Patient Details  Name: SKY PRIMO MRN: [id number] Date of Birth: May 30, 1961 Referring Provider:     Pulmonary Rehab Walk Test from 11/01/2018 in [place]  Referring Provider  Dr. Nelda Marseille       Number of Visits: 7  Reason for Discharge:  Early Exit:  Health  Smoking History:  Social History   Tobacco Use  Smoking Status Never Smoker  Smokeless Tobacco Never Used    Diagnosis:  Sarcoidosis  ADL [facility]: Pulmonary Assessment Scores    Row Name 10/30/18 1633 11/01/18 1626       ADL [facility]   ADL Phase  Entry  Entry    SOB Score total  40  -      CAT Score   CAT Score  8 pre  -      mMRC Score   mMRC Score  -  1       Initial Exercise Prescription: Initial Exercise Prescription - 11/01/18 1600      Date of Initial Exercise RX and Referring Provider   Date  11/01/18    Referring Provider  Dr. Nelda Marseille      Treadmill   MPH  1.7    Grade  0    Minutes  17      Bike   Level  0.4    Minutes  17      NuStep   Level  3    SPM  80    Minutes  17      Prescription Details   Frequency (times per week)  2    Duration  Progress to 45 minutes of aerobic exercise without signs/symptoms of physical distress      Intensity   THRR 40-80% of Max Heartrate  64-129    Ratings of Perceived Exertion  11-13    Perceived Dyspnea  0-4      Progression   Progression  Continue to progress workloads to maintain intensity without signs/symptoms of physical distress.      Resistance Training   Training Prescription  Yes    Weight  orange bands    Reps  10-15       Discharge Exercise Prescription (Final Exercise Prescription Changes): Exercise Prescription Changes - 12/11/18 1251      Response to Exercise   Blood Pressure (Admit)  156/90    Blood Pressure (Exercise)  148/80    Blood Pressure (Exit)  125/72    Heart Rate (Admit)  93 bpm    Heart Rate (Exercise)  124 bpm    Heart Rate (Exit)  100 bpm     Oxygen Saturation (Admit)  98 %    Oxygen Saturation (Exercise)  98 %    Oxygen Saturation (Exit)  98 %    Rating of Perceived Exertion (Exercise)  7    Perceived Dyspnea (Exercise)  0    Duration  Continue with 45 min of aerobic exercise without signs/symptoms of physical distress.    Intensity  THRR unchanged      Progression   Progression  Continue to progress workloads to maintain intensity without signs/symptoms of physical distress.      Resistance Training   Training Prescription  Yes    Weight  orange bands    Reps  10-15    Time  10 Minutes      Treadmill   MPH  2.9    Grade  3    Minutes  17  Bike   Level  0.8    Minutes  17      NuStep   Level  4    SPM  80    Minutes  17    METs  2.6       Functional Capacity: 6 Minute Walk    Row Name 11/01/18 1627         6 Minute Walk   Phase  Initial     Distance  1622 feet     Walk Time  6 minutes     # of Rest Breaks  0     MPH  3.07     METS  3.37     RPE  11     Perceived Dyspnea   1     Symptoms  No     Resting HR  84 bpm     Resting BP  128/70     Resting Oxygen Saturation   99 %     Exercise Oxygen Saturation  during 6 min walk  94 %     Max Ex. HR  127 bpm     Max Ex. BP  134/68     2 Minute Post BP  118/72       Interval HR   1 Minute HR  117     2 Minute HR  118     3 Minute HR  115     4 Minute HR  121     5 Minute HR  127     6 Minute HR  122     2 Minute Post HR  89     Interval Heart Rate?  Yes       Interval Oxygen   Interval Oxygen?  Yes     Baseline Oxygen Saturation %  99 %     1 Minute Oxygen Saturation %  98 %     1 Minute Liters of Oxygen  0 L     2 Minute Oxygen Saturation %  95 %     2 Minute Liters of Oxygen  0 L     3 Minute Oxygen Saturation %  97 %     3 Minute Liters of Oxygen  0 L     4 Minute Oxygen Saturation %  99 %     4 Minute Liters of Oxygen  0 L     5 Minute Oxygen Saturation %  100 %     5 Minute Liters of Oxygen  0 L     6 Minute Oxygen  Saturation %  94 %     6 Minute Liters of Oxygen  0 L     2 Minute Post Oxygen Saturation %  100 %     2 Minute Post Liters of Oxygen  0 L        Psychological, QOL, Others - Outcomes: PHQ 2/9: Depression screen [facility] 2/9 10/26/2018 10/24/2018 07/21/2017  Decreased Interest 1 1 0  Down, Depressed, Hopeless 1 1 0  PHQ - 2 Score 2 2 0  Altered sleeping 0 0 -  Tired, decreased energy 1 0 -  Change in appetite 0 0 -  Feeling bad or failure about yourself  0 0 -  Trouble concentrating 1 1 -  Moving slowly or fidgety/restless 0 0 -  Suicidal thoughts 0 0 -  PHQ-9 Score 4 3 -  Difficult doing work/chores Not difficult at all - -  Some recent data might  be hidden    Quality of Life:   Personal Goals: Goals established at orientation with interventions provided to work toward goal. Personal Goals and Risk Factors at Admission - 10/26/18 1006      Core Components/Risk Factors/Patient Goals on Admission    Weight Management  Yes;Weight Loss    Intervention  Obesity: Provide education and appropriate resources to help participant work on and attain dietary goals.;Weight Management/Obesity: Establish reasonable short term and long term weight goals.;Weight Management: Provide education and appropriate resources to help participant work on and attain dietary goals.;Weight Management: Develop a combined nutrition and exercise program designed to reach desired caloric intake, while maintaining appropriate intake of nutrient and fiber, sodium and fats, and appropriate energy expenditure required for the weight goal.    Admit Weight  158 lb 15.2 oz (72.1 kg)    Goal Weight: Short Term  150 lb (68 kg)    Goal Weight: Long Term  140 lb (63.5 kg)    Expected Outcomes  Weight Loss: Understanding of general recommendations for a balanced deficit meal plan, which promotes 1-2 lb weight loss per week and includes a negative energy balance of [phone] kcal/d;Long Term: Adherence to nutrition and physical  activity/exercise program aimed toward attainment of established weight goal;Short Term: Continue to assess and modify interventions until short term weight is achieved;Understanding recommendations for meals to include 15-35% energy as protein, 25-35% energy from fat, 35-60% energy from carbohydrates, less than 264m of dietary cholesterol, 20-35 gm of total fiber daily;Understanding of distribution of calorie intake throughout the day with the consumption of 4-5 meals/snacks    Diabetes  Yes    Intervention  Provide education about signs/symptoms and action to take for hypo/hyperglycemia.;Provide education about proper nutrition, including hydration, and aerobic/resistive exercise prescription along with prescribed medications to achieve blood glucose in normal ranges: Fasting glucose 65-99 mg/dL    Expected Outcomes  Short Term: Participant verbalizes understanding of the signs/symptoms and immediate care of hyper/hypoglycemia, proper foot care and importance of medication, aerobic/resistive exercise and nutrition plan for blood glucose control.;Long Term: Attainment of HbA1C < 7%.    Hypertension  Yes    Intervention  Provide education on lifestyle modifcations including regular physical activity/exercise, weight management, moderate sodium restriction and increased consumption of fresh fruit, vegetables, and low fat dairy, alcohol moderation, and smoking cessation.;Monitor prescription use compliance.    Expected Outcomes  Long Term: Maintenance of blood pressure at goal levels.;Short Term: Continued assessment and intervention until BP is < 140/97mHG in hypertensive participants. < 130/8037mG in hypertensive participants with diabetes, heart failure or chronic kidney disease.    Lipids  Yes    Intervention  Provide education and support for participant on nutrition & aerobic/resistive exercise along with prescribed medications to achieve LDL <66m62mDL >40mg42m Expected Outcomes  Short Term:  Participant states understanding of desired cholesterol values and is compliant with medications prescribed. Participant is following exercise prescription and nutrition guidelines.;Long Term: Cholesterol controlled with medications as prescribed, with individualized exercise RX and with personalized nutrition plan. Value goals: LDL < 66mg,81m > 40 mg.    Stress  Yes    Intervention  Offer individual and/or small group education and counseling on adjustment to heart disease, stress management and health-related lifestyle change. Teach and support self-help strategies.    Expected Outcomes  Short Term: Participant demonstrates changes in health-related behavior, relaxation and other stress management skills, ability to obtain effective social support, and compliance with psychotropic  medications if prescribed.;Long Term: Emotional wellbeing is indicated by absence of clinically significant psychosocial distress or social isolation.        Personal Goals Discharge: Goals and Risk Factor Review    Row Name 12/05/18 1633 01/02/19 1449 03/12/19 1401         Core Components/Risk Factors/Patient Goals Review   Personal Goals Review  Weight Management/Obesity;Increase knowledge of respiratory medications and ability to use respiratory devices properly.;Diabetes;Stress;Improve shortness of breath with ADL's;Develop more efficient breathing techniques such as purse lipped breathing and diaphragmatic breathing and practicing self-pacing with activity.;Hypertension  Weight Management/Obesity;Increase knowledge of respiratory medications and ability to use respiratory devices properly.;Diabetes;Stress;Improve shortness of breath with ADL's;Develop more efficient breathing techniques such as purse lipped breathing and diaphragmatic breathing and practicing self-pacing with activity.  Weight Management/Obesity;Increase knowledge of respiratory medications and ability to use respiratory devices  properly.;Diabetes;Stress;Improve shortness of breath with ADL's;Develop more efficient breathing techniques such as purse lipped breathing and diaphragmatic breathing and practicing self-pacing with activity.     Review  Pt has completed 5 exercise sessions and 2 education classess. Pt with weight gain of 1.7 kg more than likely due to holiday eating.  Pt will meet with the dietician soon to discuss weight loss strategies.  Pt with improvement of her gout flare up due to steriods.   Pt sress level remains unchanged however her tolerance and reaciton has changed.  Pt has seleceted a retirement date and feels she can see the light at the end of the tunnel.   Pt workloads remain level .6 o the airydyne, increase on the treadmill to 2.0/2.0 and increase to level 4 on the nustep.  pt reports within acceptable limits and feels her lipid management is appropriate.  Will resolve this goal.  Pt brings her meter to check pre and post blood glucose.  Pt does not take her insulin prior to exercise and this has helped with hypoglycemic episodes.  Pt pre exercie bp remain slightly above goal hosever post exercise bp readings are acceptable.  Continue to monitor and may be able to resolve this goal on next reveiw.  Pt has completed 7 exercise sessions and 3 education classess.  Pt last exercised on 12/11/18.  Pt absent due to "flare up".  Will plan to extend her time or have her discharge and return when able. Pt with weight gain of 1.6 kg most likely from prednisone therapy with flare around Thanksgiving.   Pt sress level remains unchanged however her tolerance and reaction has changed.  Pt has seleceted a retirement date and feels she can see the light at the end of the tunnel.   Pt workloads increase to  level .8 o the airydyne, increase on the treadmill to 2.9/3.0 and level 4 on the nustep.  Pt brings her meter to check pre and post blood glucose.  Pt does not take her insulin prior to exercise and this has helped with  hypoglycemic episodes.  Pt pre exercie bp remain slightly above goal (due to holding insulin prior to exercise) however post exercise bp readings are acceptable.  pt to graduate today with completion of 7 sessions     Expected Outcomes  See Admission Goals and Outcomes  See Admission Goals and Outcomes  Pt unable to complete all sessions d/t health concerns which have caused her stressed. pt was holding her insulin prior to exercise which has helped with her hypoglycemic episodes. Pt reports an improvement in her RPE and RPD scores with her progress in  rehab        Exercise Goals and Review: Exercise Goals    Row Name 10/26/18 0940             Exercise Goals   Increase Physical Activity  Yes       Intervention  Provide advice, education, support and counseling about physical activity/exercise needs.;Develop an individualized exercise prescription for aerobic and resistive training based on initial evaluation findings, risk stratification, comorbidities and participant's personal goals.       Expected Outcomes  Short Term: Attend rehab on a regular basis to increase amount of physical activity.;Long Term: Exercising regularly at least 3-5 days a week.;Long Term: Add in home exercise to make exercise part of routine and to increase amount of physical activity.       Increase Strength and Stamina  Yes       Intervention  Provide advice, education, support and counseling about physical activity/exercise needs.;Develop an individualized exercise prescription for aerobic and resistive training based on initial evaluation findings, risk stratification, comorbidities and participant's personal goals.       Expected Outcomes  Short Term: Increase workloads from initial exercise prescription for resistance, speed, and METs.;Short Term: Perform resistance training exercises routinely during rehab and add in resistance training at home;Long Term: Improve cardiorespiratory fitness, muscular endurance and  strength as measured by increased METs and functional capacity (6MWT)       Able to understand and use rate of perceived exertion (RPE) scale  Yes       Intervention  Provide education and explanation on how to use RPE scale       Expected Outcomes  Short Term: Able to use RPE daily in rehab to express subjective intensity level;Long Term:  Able to use RPE to guide intensity level when exercising independently       Able to understand and use Dyspnea scale  Yes       Intervention  Provide education and explanation on how to use Dyspnea scale       Expected Outcomes  Short Term: Able to use Dyspnea scale daily in rehab to express subjective sense of shortness of breath during exertion;Long Term: Able to use Dyspnea scale to guide intensity level when exercising independently       Knowledge and understanding of Target Heart Rate Range (THRR)  Yes       Intervention  Provide education and explanation of THRR including how the numbers were predicted and where they are located for reference       Expected Outcomes  Short Term: Able to state/look up THRR;Long Term: Able to use THRR to govern intensity when exercising independently;Short Term: Able to use daily as guideline for intensity in rehab       Understanding of Exercise Prescription  Yes       Intervention  Provide education, explanation, and written materials on patient's individual exercise prescription       Expected Outcomes  Short Term: Able to explain program exercise prescription;Long Term: Able to explain home exercise prescription to exercise independently          Exercise Goals Re-Evaluation: Exercise Goals Re-Evaluation    Row Name 12/04/18 1608 01/01/19 0733           Exercise Goal Re-Evaluation   Exercise Goals Review  Increase Physical Activity;Increase Strength and Stamina;Able to understand and use rate of perceived exertion (RPE) scale;Able to understand and use Dyspnea scale;Knowledge and understanding of Target Heart  Rate Range (THRR);Understanding of  Exercise Prescription  Increase Physical Activity;Increase Strength and Stamina;Able to understand and use rate of perceived exertion (RPE) scale;Able to understand and use Dyspnea scale;Knowledge and understanding of Target Heart Rate Range (THRR);Understanding of Exercise Prescription      Comments  Pt has completed 5 visits so far. She is open to workload increases and has progressed well so far. She exercise on the treadmill at a moderate MET level of 3.08. Will continue to monitor and progress as able.   Pt was progressing well and was exercising at 4.42 METs on the treadmill. Pt has missed the last 5 exercise sessions. Will follow up with pt if she misses today as well. Will continue to monitor and progress as well.       Expected Outcomes  Through exercise at rehab and at home, the patient will decrease shortness of breath with daily activities and feel confident in carrying out an exercise regime at home.   Through exercise at rehab and at home, the patient will decrease shortness of breath with daily activities and feel confident in carrying out an exercise regime at home.          Nutrition & Weight - Outcomes:    Nutrition: Nutrition Therapy & Goals - 11/23/18 1144      Nutrition Therapy   Diet  diabetic      Personal Nutrition Goals   Nutrition Goal  Pt to identify and limit food sources of sodium and refined carbohydrates    Personal Goal #2  The pt will consume small frequent meals across the day    Personal Goal #3  Identify food quantities necessary to achieve wt loss of  -2# per week to a goal wt loss of 2.7-10.9 kg (6-24 lb) at graduation from pulmonary rehab.    Personal Goal #4  Pt able to name foods that affect blood glucose       Intervention Plan   Intervention  Prescribe, educate and counsel regarding individualized specific dietary modifications aiming towards targeted core components such as weight, hypertension, lipid management,  diabetes, heart failure and other comorbidities.    Expected Outcomes  Short Term Goal: Understand basic principles of dietary content, such as calories, fat, sodium, cholesterol and nutrients.;Long Term Goal: Adherence to prescribed nutrition plan.       Nutrition Discharge: Nutrition Assessments - 02/01/19 0923      Rate Your Plate Scores   Pre Score  38    Post Score  32       Education Questionnaire Score: Knowledge Questionnaire Score - 10/30/18 1634      Knowledge Questionnaire Score   Pre Score  12/18       Goals reviewed with patient.   Joycelyn Man RN, BSN Cardiac and Pulmonary Rehab RN

## 2019-03-12 NOTE — Addendum Note (Signed)
Encounter addended by: Towanda Malkin, RN on: 03/12/2019 2:05 PM  Actions taken: Episode resolved, Flowsheet data copied forward, Visit Navigator Flowsheet section accepted, Clinical Note Signed

## 2019-03-12 NOTE — Telephone Encounter (Signed)
A new hem appt has been scheduled for the pt to see Dr. Irene Limbo on 3/26 at 11am. Pt has seen Dr. Irene Limbo in 2017. Agreed to the appt date and time.

## 2019-03-17 ENCOUNTER — Telehealth: Payer: Self-pay | Admitting: Medical

## 2019-03-17 NOTE — Telephone Encounter (Signed)
P.ACleda Clarks

## 2019-03-18 NOTE — Telephone Encounter (Signed)
Recv'd rejection no P.A. needed, called pharmacy & went thru for $25

## 2019-03-19 ENCOUNTER — Other Ambulatory Visit: Payer: Self-pay | Admitting: Medical

## 2019-03-19 ENCOUNTER — Telehealth: Payer: Self-pay

## 2019-03-19 NOTE — Telephone Encounter (Signed)
Bethany Webb called and states that her Doctor told her to let you know to treat her like any other patient as far as cholesterol medications.   She would like it to be called in to her pharmacy so that she can pick it up before we are all confined to our houses.

## 2019-03-19 NOTE — Telephone Encounter (Signed)
If agreeable let us restart Crestor lower dose with plan to recheck liver test in 6 weeks.  If agreeable send Crestor 10 mg 1 tab nightly #30 and 2 refills to pharmacy

## 2019-03-20 ENCOUNTER — Telehealth: Payer: Self-pay | Admitting: *Deleted

## 2019-03-20 NOTE — Telephone Encounter (Signed)
Attempted to contact patient per [facility] current rescheduling guidelines as patient stated respiratory symptoms on screening call. Left voice mail:  Dr.Kale would like to reschedule appt on 3/26 and see patient in one month.  Informed patient that scheduling will contact them.  Schedule msg sent.

## 2019-03-21 ENCOUNTER — Encounter: Payer: 59 | Admitting: Hematology

## 2019-03-21 ENCOUNTER — Other Ambulatory Visit: Payer: Self-pay

## 2019-03-21 DIAGNOSIS — E1169 Type 2 diabetes mellitus with other specified complication: Secondary | ICD-10-CM

## 2019-03-21 DIAGNOSIS — E785 Hyperlipidemia, unspecified: Principal | ICD-10-CM

## 2019-03-21 MED ORDER — ROSUVASTATIN CALCIUM 10 MG PO TABS: 10.0000 mg | ORAL_TABLET | Freq: Every day | ORAL | 2 refills | Status: DC

## 2019-03-27 ENCOUNTER — Encounter: Payer: Self-pay | Admitting: Medical

## 2019-03-27 ENCOUNTER — Ambulatory Visit (INDEPENDENT_AMBULATORY_CARE_PROVIDER_SITE_OTHER): Payer: 59 | Admitting: Medical

## 2019-03-27 ENCOUNTER — Other Ambulatory Visit: Payer: Self-pay

## 2019-03-27 VITALS — Ht 64.0 in | Wt 165.0 lb

## 2019-03-27 DIAGNOSIS — Z7189 Other specified counseling: Secondary | ICD-10-CM

## 2019-03-27 DIAGNOSIS — B029 Zoster without complications: Secondary | ICD-10-CM | POA: Diagnosis not present

## 2019-03-27 DIAGNOSIS — E118 Type 2 diabetes mellitus with unspecified complications: Secondary | ICD-10-CM | POA: Diagnosis not present

## 2019-03-27 DIAGNOSIS — Z1239 Encounter for other screening for malignant neoplasm of breast: Secondary | ICD-10-CM | POA: Insufficient documentation

## 2019-03-27 MED ORDER — VALACYCLOVIR HCL 1 G PO TABS: 1000.0000 mg | ORAL_TABLET | Freq: Three times a day (TID) | ORAL | 0 refills | Status: AC

## 2019-03-27 NOTE — Progress Notes (Signed)
Subjective:     Patient ID: Bethany Webb, female   DOB: [birth year], 58 y.o.   MRN: [id number]  Interactive audio and video telecommunications were attempted between this provider and patient, however due to patient inability to access video, we continued and completed visit with audio only.  She did sent text pictures of rash on torso to CMA for me to see.  The patient was located at home. The provider was located in the office. The patient did consent to this visit and is aware of possible charges through their insurance for this visit.  The other persons participating in this telemedicine service were none. Time spent on call was 18 minutes and in review of previous records >25 minutes total.  This virtual service is not related to other E/M service within previous 7 days.   HPI Chief Complaint  Patient presents with  . shingles    Possible shingles, burning sensations X 1week    Virtual visit today for possible shingles.  She notes that she started having burnign sensation on left back/left side about a week ago, irritation.   It worsened in the next few days.  Yesterday it was painful in area of the irritation.  There is now some redness and possible rash with small bumps.     No fever, but does get cold feeling occasionally having the sarcoidosis.   Has been more fatigued than usual.   No body aches.   Has work stress, but trying to not let the coronavirus pandemic stress her out.   Has given this over the God.    Blood sugars been running okay.  No other complaint  Past Medical History:  Diagnosis Date  . 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 Biceps tendonitis 2/12   Dr. Gladstone Lighter  . History of blood transfusion 1988   "related to hysterectomy" (09/27/2018)  . History of mammogram 05/25/12   stable, no suspicious finding, extremely dense breast tissue  . Hyperlipidemia   . Hypertension   .  Neutropenia, unspecified ([place]) 02/2013   slight, likely ethnicity related  . Sarcoidosis    "lung, liver, eyes, skin"   . Seasonal allergies   . Shortness of breath dyspnea    with exertion   . Supraspinatus tendonitis 2/12   Dr. Gladstone Lighter  . Type II diabetes mellitus ([place])   . Wears glasses    Current Outpatient Medications on File Prior to Visit  Medication Sig Dispense Refill  . amLODipine (NORVASC) 10 MG tablet Take 1 tablet (10 mg total) by mouth daily. 90 tablet 3  . azaTHIOprine (IMURAN) 50 MG tablet Take 50 mg by mouth daily.    . blood glucose meter kit and supplies KIT Dispense based on patient and insurance preference. Use up to four times daily as directed. (FOR ICD-9 250.00, 250.01). 1 each 0  . cholecalciferol (VITAMIN D) 1000 units tablet Take 1 tablet (1,000 Units total) by mouth daily. 90 tablet 3  . glucose blood (ONE TOUCH ULTRA TEST) test strip USE AS DIRECTED 100 each 2  . HUMALOG MIX 75/25 KWIKPEN (75-25) 100 UNIT/ML Kwikpen Inject 12 Units into the skin 3 (three) times daily. 15 mL 2  . ibuprofen (ADVIL,MOTRIN) 800 MG tablet     . Insulin Pen Needle (BD PEN NEEDLE NANO U/F) 32G X 4 MM MISC INJECT THREE TIMES DAILY AS DIRECTED 100 each 11  . Lancets 30G MISC Test  blood sugars twice daily 100 each 2  . lisinopril-hydrochlorothiazide (PRINZIDE,ZESTORETIC) 20-25 MG tablet TAKE 1 TABLET BY MOUTH DAILY 90 tablet 0  . metFORMIN (GLUCOPHAGE-XR) 500 MG 24 hr tablet Take 1 tablet (500 mg total) by mouth daily with breakfast. 90 tablet 1  . PEPPERMINT OIL PO Take 1 drop by mouth See admin instructions. Place 1 drop into 12-16 ounces of water and drink as needed for nausea or headaches    . rosuvastatin (CRESTOR) 10 MG tablet Take 1 tablet (10 mg total) by mouth daily. 30 tablet 2  . traMADol (ULTRAM) 50 MG tablet Take 1 tablet (50 mg total) by mouth every 12 (twelve) hours as needed (pain). (Patient not taking: Reported on 02/25/2019) 30 tablet 0   No current facility-administered  medications on file prior to visit.     Review of Systems As in subjective     Objective:   Physical Exam Ht '5\' 4"'  (1.626 m)   Wt 165 lb (74.8 kg)   LMP 12/27/1987   BMI 28.32 kg/m   General: No acute distress Psych: Pleasant, answers questions appropriately Skin: Left upper back/lateral chest wall with faint patch of erythema approximately 3 cm diameter but no obvious vesicles or rash otherwise     Assessment:     Encounter Diagnoses  Name Primary?  . Herpes zoster without complication Yes  . Diabetes mellitus with complication ([place])   . Counseling on health promotion and disease prevention        Plan:     Discussed the symptoms, exam findings, suggestive of possible early shingles outbreak.  discussed transmission, precautions to prevent spread to others, particularly high risk groups as discussed including young children, elderly, immunocompromised people, or pregnant women.  Discussed treatment including relative rest, hydration, can use OTC Cortaid topically but don't let others use the cream and discard the cream after this episode of shingles.   Begin Valtrex as below.   discussed the possibility of post herpetic neuralgia.   answered their questions, After visit summary given.   Coronavirus counseling: I know that they like many people are concerned about personal safety and transmission of this Coronavirus.  In general I recommend they avoid contact with people as much as possible for the foreseeable next several weeks, likely through April.   Obviously if they have to go to the grocery store or fuel station, then limit contact with people, and try to keep at least a 6 foot distance away from folks.  If still working, try and stay well away from people while at work to limit exposure to moisture droplets such as from sneezing, coughing, and general close conversation.   Coronavirus is spread through these droplets.  Likewise, limit touching surfaces others touch such  as door knobs, buggy handles, gas pumps.  Use gloves, or use alcohol hand gel to help decreased chance of catching germs when pumping gas for example.  Avoid touching face, eyes, noes.   Wash hands frequently with soap and water for at least 20 minutes  Advised call back in 3-4 days to update me on her symptoms  Reviewed diabetes labs from 03/01/19 visit.  Faizah was seen today for shingles.  Diagnoses and all orders for this visit:  Herpes zoster without complication  Diabetes mellitus with complication ([place])  Counseling on health promotion and disease prevention  Other orders -     valACYclovir (VALTREX) 1000 MG tablet; Take 1 tablet (1,000 mg total) by mouth 3 (three) times daily.

## 2019-04-01 ENCOUNTER — Telehealth: Payer: Self-pay

## 2019-04-01 ENCOUNTER — Telehealth: Payer: Self-pay | Admitting: Hematology

## 2019-04-01 NOTE — Telephone Encounter (Signed)
Patient left message on my voicemail stating that she is taking the medication and pain has subsided a little and she is doing good.

## 2019-04-01 NOTE — Telephone Encounter (Signed)
Pt has been rescheduled to see Dr. Irene Limbo on 4/23 at 10am. She's been cld and agreed to the new appt date and time.

## 2019-04-01 NOTE — Telephone Encounter (Signed)
Did she ever get a worse rash or did things calm down relatively quickly with the Valrex?

## 2019-04-02 NOTE — Telephone Encounter (Signed)
I am glad to hear it is improving.  There is no benefit to continuing Valtrex but I would certainly use some Tylenol for the next several days as needed.  If another week or 2 goes by and the pain is still there at least 50% or worse sometimes we will use other medications for neuropathic pain.  She will have to keep me informed of this or recheck in a few weeks if the pain increases or continues

## 2019-04-02 NOTE — Telephone Encounter (Signed)
Patient states that she is still having pain, rash is better and she has 2 more days of valtrex.  She does not think it will be gone.  She is feeling better.

## 2019-04-02 NOTE — Telephone Encounter (Signed)
Patient notified of recommendations. 

## 2019-04-08 ENCOUNTER — Other Ambulatory Visit: Payer: Self-pay | Admitting: Medical

## 2019-04-12 DIAGNOSIS — D869 Sarcoidosis, unspecified: Secondary | ICD-10-CM | POA: Diagnosis not present

## 2019-04-17 NOTE — Progress Notes (Signed)
Bethany Webb  HEMATOLOGY ONCOLOGY PROGRESS NOTE  Date of service: 04/18/19   Patient Care Team: Tysinger, Camelia Eng, PA-C as PCP - General (Family Medicine) Arta Silence, MD as Consulting Physician (Gastroenterology)  Cc:  Leucopenia H/o sarcoidosis   INTERVAL HISTORY:  Bethany Webb is here for management and evaluation of her newly noted leukopenia. I last saw her on 01/21/16 regarding her sarcoidosis. The pt reports that she is doing well overall.  The pt reports that she has been "doing very well." She is seeing Dr. Jeneen Rinks at the [facility], for management of her sarcoidosis. She is taking 48m Azathioprine daily currently. She notes that she participated in a clinical trial in 2018, and hasn't been on the study drug for a while now. She notes that her pulmonologist Dr. JJeneen Rinksis considering holding her Azathioprine if her leukopenia isn't shown to be resolved after labs today. She notes that she has had lower WBC counts for the last year, but worsened on last labs in early March with her PCP DChana Bode PA-C.  The pt is also taking 515mPrednisone BID for small sore spots on her skin, thought to be related to her sarcoid. She denies SOB or fevers. The pt denies other bothersome symptoms or concerns from her sarcoid at this time.  She has some mild itching related to her recent shingles outbreak, and took Valtrex for 7 days.   The pt had flu like symptoms in early March, with fevers, [place] have a viral work up. She notes that after her symptoms resolved in 1-2 weeks, she began feeling better.  The pt reports that she began a new statin about a month ago as well.  Most recent lab results (02/25/19) of CBC w/diff and BMP is as follows: all values are WNL except for WBC at 1.9k, ANC at 900, Lymphocytes abs at 600, Creatinine at 1.06.  On review of systems, pt reports good energy levels, recent shingles,  recent viral infection, and denies SOB, fevers, chills, other rashes, eye problems, abdominal pains, leg swelling, changes in breathing, mouth sores, and any other symptoms.    REVIEW OF SYSTEMS:    A 10+ POINT REVIEW OF SYSTEMS WAS OBTAINED including neurology, dermatology, psychiatry, cardiac, respiratory, lymph, extremities, GI, GU, Musculoskeletal, constitutional, breasts, reproductive, HEENT.  All pertinent positives are noted in the HPI.  All others are negative.   . Past Medical History:  Diagnosis Date  . 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 Biceps tendonitis 2/12   Dr. GiGladstone Lighter. History of blood transfusion 1988   "related to hysterectomy" (09/27/2018)  . History of mammogram 05/25/12   stable, no suspicious finding, extremely dense breast tissue  . Hyperlipidemia   . Hypertension   . Neutrop[place], unspecified [place]   slight, likely ethnicity related  . Sarcoidosis    "lung, liver, eyes, skin"   . Seasonal allergies   . Shortness of breath dyspnea    with exertion   . Supraspinatus tendonitis 2/12   Dr. GiGladstone Lighter. [place] II diabetes mellitus ([place]  . Wears glasses     . Past Surgical History:  Procedure Laterality Date  . ABDOMINAL HYSTERECTOMY  1990   and BSO; endometriosis  . ANTERIOR CERVICAL DECOMP/DISCECTOMY FUSION N/A 06/20/2013   Procedure: ANTERIOR CERVICAL DECOMPRESSION/DISCECTOMY FUSION 1 LEVEL;  Surgeon:  Ophelia Charter, MD;  Location: [place] [facility];  Service: Neurosurgery;  Laterality: N/A;  C45 anterior cervical decompression with fusion interbody prothesis plating and bonegraft  . BACK SURGERY    . CARPAL TUNNEL RELEASE Right 06/20/2013   Procedure: CARPAL TUNNEL RELEASE;  Surgeon: Ophelia Charter, MD;  Location: [place] [facility];  Service: Neurosurgery;  Laterality: Right;  RIGHT carpal tunnel release  . COLONOSCOPY  03/06/12   sigmoid polyp; repeat 10 years; Dr.  Tora Duck  . LUNG SURGERY  1987   endometriosis in lungs  . REDUCTION MAMMAPLASTY  1994  . SUPRACLAVICAL NODE BIOPSY Left 01/14/2016   Procedure: LEFT SUPRACLAVICAL NODE BIOPSY;  Surgeon: Melrose Nakayama, MD;  Location: [facility];  Service: Thoracic;  Laterality: Left;  . TONSILLECTOMY      . Social History   Tobacco Use  . Smoking status: Never Smoker  . Smokeless tobacco: Never Used  Substance Use Topics  . Alcohol use: Yes    Alcohol/week: 0.0 standard drinks    Comment: rare wine, maybe 2-3 times years  . Drug use: No    ALLERGIES:  is allergic to fish-derived products; shellfish-derived products; codeine; gabapentin; and metformin and related.  MEDICATIONS:  Current Outpatient Medications  Medication Sig Dispense Refill  . amLODipine (NORVASC) 10 MG tablet Take 1 tablet (10 mg total) by mouth daily. 90 tablet 3  . azaTHIOprine (IMURAN) 50 MG tablet Take 50 mg by mouth daily.    . blood glucose meter kit and supplies KIT Dispense based on patient and insurance preference. Use up to four times daily as directed. (FOR ICD-9 250.00, 250.01). 1 each 0  . cholecalciferol (VITAMIN D) 1000 units tablet Take 1 tablet (1,000 Units total) by mouth daily. 90 tablet 3  . glucose blood (ONE TOUCH ULTRA TEST) test strip USE AS DIRECTED 100 each 2  . HUMALOG MIX 75/25 KWIKPEN (75-25) 100 UNIT/ML Kwikpen Inject 12 Units into the skin 3 (three) times daily. 15 mL 2  . ibuprofen (ADVIL,MOTRIN) 800 MG tablet     . Insulin Pen Needle (BD PEN NEEDLE NANO U/F) 32G X 4 MM MISC INJECT THREE TIMES DAILY AS DIRECTED 100 each 11  . Lancets 30G MISC Test blood sugars twice daily 100 each 2  . lisinopril-hydrochlorothiazide (PRINZIDE,ZESTORETIC) 20-25 MG tablet TAKE 1 TABLET BY MOUTH DAILY 90 tablet 0  . metFORMIN (GLUCOPHAGE-XR) 500 MG 24 hr tablet Take 1 tablet (500 mg total) by mouth daily with breakfast. 90 tablet 1  . PEPPERMINT OIL PO Take 1 drop by mouth See admin instructions. Place 1 drop  into 12-16 ounces of water and drink as needed for nausea or headaches    . rosuvastatin (CRESTOR) 10 MG tablet Take 1 tablet (10 mg total) by mouth daily. 30 tablet 2  . traMADol (ULTRAM) 50 MG tablet Take 1 tablet (50 mg total) by mouth every 12 (twelve) hours as needed (pain). (Patient not taking: Reported on 02/25/2019) 30 tablet 0  . valACYclovir (VALTREX) 1000 MG tablet Take 1 tablet (1,000 mg total) by mouth 3 (three) times daily. 21 tablet 0   No current facility-administered medications for this visit.     PHYSICAL EXAMINATION: ECOG PERFORMANCE STATUS: 2 - Symptomatic, <50% confined to bed  Vitals:   04/18/19 1035  BP: 135/75  Pulse: 92  Resp: 18  Temp: 98.4 F (36.9 C)  SpO2: 100%    Filed Weights   04/18/19 1035  Weight: 174 lb 9.6 oz (79.2 kg)   .  Body mass index is 29.97 kg/m.  GENERAL:alert, in no acute distress and comfortable SKIN: no acute rashes, no significant lesions EYES: conjunctiva are pink and non-injected, sclera anicteric OROPHARYNX: MMM, no exudates, no oropharyngeal erythema or ulceration NECK: supple, no JVD LYMPH:  no palpable lymphadenopathy in the cervical, axillary or inguinal regions LUNGS: clear to auscultation b/l with normal respiratory effort HEART: regular rate & rhythm ABDOMEN:  normoactive bowel sounds , non tender, not distended. No palpable hepatosplenomegaly.  Extremity: no pedal edema PSYCH: alert & oriented x 3 with fluent speech NEURO: no focal motor/sensory deficits   LABORATORY DATA:   I have reviewed the data as listed  . CBC Latest Ref Rng & Units 04/18/2019 02/25/2019 09/29/2018  WBC 4.0 - 10.5 K/uL 3.7(L) 1.9(LL) 2.0(L)  Hemoglobin 12.0 - 15.0 g/dL 11.8(L) 12.2 11.3(L)  Hematocrit 36.0 - 46.0 % 36.1 35.7 33.6(L)  Platelets 150 - 400 K/uL 373 258 278  ANC 2.1k  . CMP Latest Ref Rng & Units 04/18/2019 03/01/2019 02/25/2019  Glucose 70 - 99 mg/dL 75 - 99  BUN 6 - 20 mg/dL 15 - 21  Creatinine 0.44 - 1.00 mg/dL 0.87 -  1.06(H)  Sodium 135 - 145 mmol/L 144 - 137  Potassium 3.5 - 5.1 mmol/L 3.4(L) - 3.5  Chloride 98 - 111 mmol/L 105 - 96  CO2 22 - 32 mmol/L 26 - 28  Calcium 8.9 - 10.3 mg/dL 9.2 - 8.7  Total Protein 6.5 - 8.1 g/dL 7.7 6.6 -  Total Bilirubin 0.3 - 1.2 mg/dL 0.8 0.5 -  Alkaline Phos 38 - 126 U/L 140(H) 107 -  AST 15 - 41 U/L 43(H) 36 -  ALT 0 - 44 U/L 35 17 -     RADIOGRAPHIC STUDIES: I have personally reviewed the radiological images as listed and agreed with the findings in the report. No results found.  ASSESSMENT & PLAN:   58 y.o.  very pleasant African-American female with  #1 Sarcoidosis with multisystem involvement Presented with pulmonary and lymph nodes. 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 -Previous Upper abdominal symptoms suggest potential gastrointestinal involvement. -Previous dry eyes and dry mouth suggestive of involvement of the lacrimal glands and potentially the slightly glands by sarcoidosis. -Previously had cutaneous involvement characterized by a rash on the back that has since spontaneously improved. Still has a fair amount of pruritus. -Had burning paresthesias in her lower extremities suggesting potential nerve involvement. -Significant constitutional symptoms including fatigue. -likely thyroid involvement/thyroiditis. -Continue to follow up with Dr. Jeneen Rinks at [facility] for comprehensive sarcoidosis care  #2 Leukopenia seen on March 2020 labs  PLAN: -Discussed pt labwork from 02/25/19; WBC at 1.9k with ANC at 900 and Lymphs at 600. HGB normal at 12.2 and PLT normal at 258k. Last hepatic function panel from 03/01/19 revealed all values WNL. -Last labs from early March 2020 were completed in context of likely viral infection which can suppress counts -She has also had recent  shingles outbreak and was on Acyclovir in the last few weeks, which can suppress counts -If sarcoid is well controlled, less likely leukopenia is related to this -Discussed that Azathioprine can also cause leukopenia -Will check labs again today and will check CMV and EBV -Continue to follow up with Dr. Jeneen Rinks at [facility] for comprehensive sarcoidosis care -Will see the pt back based on labs   Labs today  RTC with Dr Irene Limbo based on labs   The total time spent in the appt was 25 minutes and more than 50% was on counseling and direct patient cares.    Sullivan Lone MD [place] AAHIVMS [facility] Hematology/Oncology Physician [facility]  (Office):       [phone] (Work cell):  [phone] (Fax):           [phone]  I, Baldwin [place], am acting as a scribe for Dr. Sullivan Lone.   .I have reviewed the above documentation for accuracy and completeness, and I agree with the above. Brunetta Genera MD

## 2019-04-18 ENCOUNTER — Inpatient Hospital Stay: Payer: 59

## 2019-04-18 ENCOUNTER — Telehealth: Payer: Self-pay | Admitting: Hematology

## 2019-04-18 ENCOUNTER — Other Ambulatory Visit: Payer: Self-pay

## 2019-04-18 ENCOUNTER — Inpatient Hospital Stay: Payer: 59 | Attending: Hematology | Admitting: Hematology

## 2019-04-18 VITALS — BP 135/75 | HR 92 | Temp 98.4°F | Resp 18 | Ht 64.0 in | Wt 174.6 lb

## 2019-04-18 DIAGNOSIS — Z794 Long term (current) use of insulin: Secondary | ICD-10-CM | POA: Insufficient documentation

## 2019-04-18 DIAGNOSIS — Z79899 Other long term (current) drug therapy: Secondary | ICD-10-CM | POA: Insufficient documentation

## 2019-04-18 DIAGNOSIS — E119 Type 2 diabetes mellitus without complications: Secondary | ICD-10-CM | POA: Insufficient documentation

## 2019-04-18 DIAGNOSIS — D869 Sarcoidosis, unspecified: Secondary | ICD-10-CM | POA: Diagnosis not present

## 2019-04-18 DIAGNOSIS — D72819 Decreased white blood cell count, unspecified: Secondary | ICD-10-CM | POA: Insufficient documentation

## 2019-04-18 DIAGNOSIS — I10 Essential (primary) hypertension: Secondary | ICD-10-CM | POA: Diagnosis not present

## 2019-04-18 DIAGNOSIS — Z7984 Long term (current) use of oral hypoglycemic drugs: Secondary | ICD-10-CM | POA: Diagnosis not present

## 2019-04-18 LAB — RETICULOCYTES
Immature Retic Fract: 10 % (ref 2.3–15.9)
RBC.: 3.93 MIL/uL (ref 3.87–5.11)
Retic Count, Absolute: 94.7 10*3/uL (ref 19.0–186.0)
Retic Ct Pct: 2.4 % (ref 0.4–3.1)

## 2019-04-18 LAB — CBC WITH DIFFERENTIAL/PLATELET
Abs Immature Granulocytes: 0.01 10*3/uL (ref 0.00–0.07)
Basophils Absolute: 0 10*3/uL (ref 0.0–0.1)
Basophils Relative: 1 %
Eosinophils Absolute: 0 10*3/uL (ref 0.0–0.5)
Eosinophils Relative: 1 %
HCT: 36.1 % (ref 36.0–46.0)
Hemoglobin: 11.8 g/dL — ABNORMAL LOW (ref 12.0–15.0)
Immature Granulocytes: 0 %
Lymphocytes Relative: 31 %
Lymphs Abs: 1.1 10*3/uL (ref 0.7–4.0)
MCH: 29.9 pg (ref 26.0–34.0)
MCHC: 32.7 g/dL (ref 30.0–36.0)
MCV: 91.4 fL (ref 80.0–100.0)
Monocytes Absolute: 0.4 10*3/uL (ref 0.1–1.0)
Monocytes Relative: 10 %
Neutro Abs: 2.1 10*3/uL (ref 1.7–7.7)
Neutrophils Relative %: 57 %
Platelets: 373 10*3/uL (ref 150–400)
RBC: 3.95 MIL/uL (ref 3.87–5.11)
RDW: 14.2 % (ref 11.5–15.5)
WBC: 3.7 10*3/uL — ABNORMAL LOW (ref 4.0–10.5)
nRBC: 0 % (ref 0.0–0.2)

## 2019-04-18 LAB — SEDIMENTATION RATE: Sed Rate: 14 mm/hr (ref 0–22)

## 2019-04-18 LAB — CMP (CANCER CENTER ONLY)
ALT: 35 U/L (ref 0–44)
AST: 43 U/L — ABNORMAL HIGH (ref 15–41)
Albumin: 4.3 g/dL (ref 3.5–5.0)
Alkaline Phosphatase: 140 U/L — ABNORMAL HIGH (ref 38–126)
Anion gap: 13 (ref 5–15)
BUN: 15 mg/dL (ref 6–20)
CO2: 26 mmol/L (ref 22–32)
Calcium: 9.2 mg/dL (ref 8.9–10.3)
Chloride: 105 mmol/L (ref 98–111)
Creatinine: 0.87 mg/dL (ref 0.44–1.00)
GFR, Est AFR Am: >60 mL/min (ref >60)
GFR, Estimated: >60 mL/min (ref >60)
Glucose, Bld: 75 mg/dL (ref 70–99)
Potassium: 3.4 mmol/L — ABNORMAL LOW (ref 3.5–5.1)
Sodium: 144 mmol/L (ref 135–145)
Total Bilirubin: 0.8 mg/dL (ref 0.3–1.2)
Total Protein: 7.7 g/dL (ref 6.5–8.1)

## 2019-04-18 NOTE — Telephone Encounter (Signed)
Per 4/23 los RTC with Dr Irene Limbo based on labs.

## 2019-04-19 LAB — CMV IGM: CMV IgM: <30 AU/mL (ref 0.0–29.9)

## 2019-04-19 LAB — CMV ANTIBODY, IGG (EIA): CMV Ab - IgG: >10 U/mL — ABNORMAL HIGH (ref 0.00–0.59)

## 2019-04-19 LAB — EPSTEIN-BARR VIRUS VCA, IGG: EBV VCA IgG: >600 U/mL — ABNORMAL HIGH (ref 0.0–17.9)

## 2019-04-19 LAB — EPSTEIN-BARR VIRUS VCA, IGM: EBV VCA IgM: 58.9 U/mL — ABNORMAL HIGH (ref 0.0–35.9)

## 2019-04-25 ENCOUNTER — Telehealth: Payer: Self-pay | Admitting: *Deleted

## 2019-04-25 NOTE — Telephone Encounter (Signed)
-----   Message from Brunetta Genera, MD sent at 04/24/2019 12:33 AM EDT ----- Plz let patient know her WBC count and neutropenia have resolved. Likely medication related. Was also +ve for EBV IgM suggest recent EBV infection that could have contributed to lower counts as well. thx GK

## 2019-04-25 NOTE — Telephone Encounter (Signed)
Contacted patient as directed in message from Dr. Irene Limbo. Patient appreciative of information and verbalized understanding. Adised to contact office if further concerns.

## 2019-05-02 ENCOUNTER — Ambulatory Visit: Payer: 59 | Admitting: Medical

## 2019-05-02 ENCOUNTER — Encounter: Payer: Self-pay | Admitting: Medical

## 2019-05-02 ENCOUNTER — Other Ambulatory Visit: Payer: Self-pay

## 2019-05-02 VITALS — BP 124/70 | Temp 99.1°F | Ht 64.0 in | Wt 175.8 lb

## 2019-05-02 DIAGNOSIS — D72819 Decreased white blood cell count, unspecified: Secondary | ICD-10-CM | POA: Diagnosis not present

## 2019-05-02 DIAGNOSIS — I889 Nonspecific lymphadenitis, unspecified: Secondary | ICD-10-CM | POA: Diagnosis not present

## 2019-05-02 DIAGNOSIS — E118 Type 2 diabetes mellitus with unspecified complications: Secondary | ICD-10-CM | POA: Diagnosis not present

## 2019-05-02 NOTE — Progress Notes (Signed)
Subjective: Chief Complaint  Patient presents with  . swollen face    swollen right side of face X 1 day     She reports swelling under right ear x 1 days.  No body aches, no chills, no fever, but is always cold.   Had some right ear pain last night.  No sore throat, no sinus pressure, no headache, no no teeth pain,  No nausea, no vomiting.  Around people at shelter, using social distancing.  No redness, no heat.   Not using anything for it.  No cough, no SOB.    Glucose running ok unless she eats junk.   She is back on prednisone.   She is having carpal tunnel surgery left wrist next Thursday in 7 days.   No other aggravating or relieving factors. No other complaint.  Past Medical History:  Diagnosis Date  . 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 Biceps tendonitis 2/12   Dr. Gladstone Lighter  . History of blood transfusion 1988   "related to hysterectomy" (09/27/2018)  . History of mammogram 05/25/12   stable, no suspicious finding, extremely dense breast tissue  . Hyperlipidemia   . Hypertension   . Neutropenia, unspecified ([place]) 02/2013   slight, likely ethnicity related  . Sarcoidosis    "lung, liver, eyes, skin"   . Seasonal allergies   . Shortness of breath dyspnea    with exertion   . Supraspinatus tendonitis 2/12   Dr. Gladstone Lighter  . Type II diabetes mellitus ([place])   . Wears glasses    Current Outpatient Medications on File Prior to Visit  Medication Sig Dispense Refill  . amLODipine (NORVASC) 10 MG tablet Take 1 tablet (10 mg total) by mouth daily. 90 tablet 3  . azaTHIOprine (IMURAN) 50 MG tablet Take 50 mg by mouth daily.    . blood glucose meter kit and supplies KIT Dispense based on patient and insurance preference. Use up to four times daily as directed. (FOR ICD-9 250.00, 250.01). 1 each 0  . cholecalciferol (VITAMIN D) 1000 units tablet Take 1 tablet (1,000 Units total) by mouth daily. 90 tablet 3   . glucose blood (ONE TOUCH ULTRA TEST) test strip USE AS DIRECTED 100 each 2  . HUMALOG MIX 75/25 KWIKPEN (75-25) 100 UNIT/ML Kwikpen Inject 12 Units into the skin 3 (three) times daily. 15 mL 2  . ibuprofen (ADVIL,MOTRIN) 800 MG tablet     . Insulin Pen Needle (BD PEN NEEDLE NANO U/F) 32G X 4 MM MISC INJECT THREE TIMES DAILY AS DIRECTED 100 each 11  . Lancets 30G MISC Test blood sugars twice daily 100 each 2  . lisinopril-hydrochlorothiazide (PRINZIDE,ZESTORETIC) 20-25 MG tablet TAKE 1 TABLET BY MOUTH DAILY 90 tablet 0  . metFORMIN (GLUCOPHAGE-XR) 500 MG 24 hr tablet Take 1 tablet (500 mg total) by mouth daily with breakfast. 90 tablet 1  . PEPPERMINT OIL PO Take 1 drop by mouth See admin instructions. Place 1 drop into 12-16 ounces of water and drink as needed for nausea or headaches    . predniSONE (DELTASONE) 5 MG tablet Take 5 mg by mouth daily.    . rosuvastatin (CRESTOR) 10 MG tablet Take 1 tablet (10 mg total) by mouth daily. 30 tablet 2  . valACYclovir (VALTREX) 1000 MG tablet Take 1 tablet (1,000 mg total) by mouth 3 (three) times daily. 21 tablet 0  . traMADol (ULTRAM) 50 MG tablet Take 1  tablet (50 mg total) by mouth every 12 (twelve) hours as needed (pain). (Patient not taking: Reported on 02/25/2019) 30 tablet 0   No current facility-administered medications on file prior to visit.      Objective: BP 124/70   Temp 99.1 F (37.3 C) (Temporal)   Ht _0  (1.626 m)   Wt 175 lb 12.8 oz (79.7 kg)   LMP 12/27/1987   BMI 30.18 kg/m   General appearance: alert, no distress, WD/WN,  HEENT: normocephalic, sclerae anicteric, TMs pearly, nares patent, no discharge or erythema, pharynx normal Oral cavity: MMM, no lesions Neck: supple, shoddy tender nodes under right mandible, otherwise no lymphadenopathy, no thyromegaly, no masses   Assessment: Encounter Diagnoses  Name Primary?  . Lymphadenitis Yes  . Diabetes mellitus with complication ([place])   . Leukopenia, unspecified type       Plan: Discussed findings of mild lymphadenitis, likely benign.   Discussed concerns. Advised rest, hydration, tylenol prn, and if any symptoms change or worsen to call back immediately.  Discussed other differential that is not apparent today such as lymphoma, cellulitis, blocked salivary duct, other.  Likely viral etiology today.   Leucopenia - reviewed recent hematology consult notes. It was thought that the leucopenia was related to medication and recent viral illness.   Diabetes - relatively controlled, c/t glucose monitoring, continue current medications  Plan to keep appt for carpal tunnel surgery in 1 week unless symptoms change  Christen was seen today for swollen face.  Diagnoses and all orders for this visit:  Lymphadenitis  Diabetes mellitus with complication (H[facility])  Leukopenia, unspecified type

## 2019-05-09 ENCOUNTER — Encounter: Payer: Self-pay | Admitting: Orthopaedic Surgery

## 2019-05-09 ENCOUNTER — Other Ambulatory Visit: Payer: Self-pay | Admitting: Physician Assistant

## 2019-05-09 DIAGNOSIS — G5622 Lesion of ulnar nerve, left upper limb: Secondary | ICD-10-CM | POA: Diagnosis not present

## 2019-05-09 DIAGNOSIS — G5602 Carpal tunnel syndrome, left upper limb: Secondary | ICD-10-CM

## 2019-05-09 DIAGNOSIS — M654 Radial styloid tenosynovitis [de Quervain]: Secondary | ICD-10-CM | POA: Diagnosis not present

## 2019-05-09 MED ORDER — ONDANSETRON HCL 4 MG PO TABS: 4.0000 mg | ORAL_TABLET | Freq: Three times a day (TID) | ORAL | 0 refills | Status: DC | PRN

## 2019-05-09 MED ORDER — HYDROCODONE-ACETAMINOPHEN 5-325 MG PO TABS: 1.0000 | ORAL_TABLET | Freq: Four times a day (QID) | ORAL | 0 refills | Status: DC | PRN

## 2019-05-10 ENCOUNTER — Telehealth: Payer: Self-pay | Admitting: Orthopaedic Surgery

## 2019-05-10 NOTE — Telephone Encounter (Signed)
Received a call from patients [place]. She received pts FMLA forms but they did not reflect pts 5/14 surgery. She emailed them to me, and forms were corrected to reflect where she did have surgery. I emailed the corrected forms back to [place]. Ph is [phone]

## 2019-05-23 ENCOUNTER — Ambulatory Visit (INDEPENDENT_AMBULATORY_CARE_PROVIDER_SITE_OTHER): Payer: 59 | Admitting: Physician Assistant

## 2019-05-23 ENCOUNTER — Encounter: Payer: Self-pay | Admitting: Orthopaedic Surgery

## 2019-05-23 ENCOUNTER — Other Ambulatory Visit: Payer: Self-pay

## 2019-05-23 DIAGNOSIS — Z9889 Other specified postprocedural states: Secondary | ICD-10-CM

## 2019-05-23 NOTE — Progress Notes (Signed)
Post-Op Visit Note   Patient: Bethany Webb           Date of Birth: 1961/02/05           MRN: [id number] Visit Date: 05/23/2019 PCP: Carlena Hurl, PA-C   Assessment & Plan:  Chief Complaint:  Chief Complaint  Patient presents with  . Left Wrist - Pain, Routine Post Op   Visit Diagnoses:  1. History of carpal tunnel surgery of left wrist     Plan: Patient is a pleasant 58 year old female who presents our clinic today 2 weeks status post left carpal tunnel release, date of surgery May 09, 2019.  Preop nerve conduction study showed a severe left median nerve entrapment.  She has been doing quite well.  No pain.  She does note occasional numbness when she accidentally sleeps on her left arm.  Otherwise, no complaints.  Examination of her left wrist reveals a well-healing surgical incision with nylon sutures in place.  No evidence of infection.  Full range of motion.  She is neurovascularly intact distally.  Today, nylon sutures were removed.  We will provide the patient with a removable wrist splint to wear out in public for needed.  Advised her no soaking the incision or heavy lifting to the left upper extremity for another 2 weeks.  She will follow-up with [name] in 4 weeks time for recheck.  Work note was provided today to return to work in 2 weeks.  She will call with concerns or questions in the meantime.  Follow-Up Instructions: Return in about 4 weeks (around 06/20/2019).   Orders:  No orders of the defined types were placed in this encounter.  No orders of the defined types were placed in this encounter.   Imaging: No new imaging   PMFS History: Patient Active Problem List   Diagnosis Date Noted  . History of carpal tunnel surgery of left wrist 05/23/2019  . Lymphadenitis 05/02/2019  . Herpes zoster without complication 10/93/2355  . Counseling on health promotion and disease prevention 03/27/2019  . Leukopenia 02/25/2019  . De Quervain's disease (tenosynovitis)  11/16/2018  . Right wrist tendonitis 11/08/2018  . Myalgia 11/08/2018  . Low back pain 11/08/2018  . Coccydynia 10/01/2018  . Diffuse pain 09/27/2018  . Carpal tunnel syndrome 08/31/2018  . High risk medication use 01/09/2018  . Need for influenza vaccination 09/21/2017  . Vaccine counseling 09/21/2017  . 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 Abnormal liver function 01/21/2016  . Elevated liver enzymes 01/05/2016  . Abnormal chest CT 01/05/2016  . Pleural plaque 01/05/2016  . Pulmonary nodule 01/05/2016  . Allergic rhinitis 10/18/2011  . Diabetes mellitus with complication ([place]) 73/22/0254  . Hyperlipidemia associated with type 2 diabetes mellitus ([place]) 10/06/2011  . Hypertension associated with diabetes ([place]) 10/06/2011   Past Medical History:  Diagnosis Date  . 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 Biceps tendonitis 2/12   Dr. Gladstone Lighter  . History of blood transfusion 1988   "related to hysterectomy" (09/27/2018)  . History of mammogram 05/25/12   stable, no suspicious finding, extremely dense breast tissue  . Hyperlipidemia   . Hypertension   . Neutropenia, unspecified ([place]) 02/2013   slight, likely ethnicity related  . Sarcoidosis    "lung, liver, eyes, skin"   . Seasonal allergies   . Shortness of  breath dyspnea    with exertion   . Supraspinatus tendonitis 2/12   Dr. Gladstone Lighter  . Type II diabetes mellitus ([place])   . Wears glasses     Family History  Problem Relation Age of Onset  . Heart disease Father 54       died of MI, CABG   . Multiple sclerosis Sister   . Asthma Brother   . Cancer Paternal Grandmother        breast  . Diabetes Other        maternal and paternal grandmother  . Cancer Maternal Uncle        hematologic  . Hypertension Maternal Grandmother   . Cancer Maternal Grandfather         prostate  . Hypertension Maternal Grandfather   . Asthma Brother   . Eczema Sister   . Allergies Sister   . Healthy Daughter     Past Surgical History:  Procedure Laterality Date  . ABDOMINAL HYSTERECTOMY  1990   and BSO; endometriosis  . ANTERIOR CERVICAL DECOMP/DISCECTOMY FUSION N/A 06/20/2013   Procedure: ANTERIOR CERVICAL DECOMPRESSION/DISCECTOMY FUSION 1 LEVEL;  Surgeon: Ophelia Charter, MD;  Location: [facility];  Service: Neurosurgery;  Laterality: N/A;  C45 anterior cervical decompression with fusion interbody prothesis plating and bonegraft  . BACK SURGERY    . CARPAL TUNNEL RELEASE Right 06/20/2013   Procedure: CARPAL TUNNEL RELEASE;  Surgeon: Ophelia Charter, MD;  Location: [facility];  Service: Neurosurgery;  Laterality: Right;  RIGHT carpal tunnel release  . COLONOSCOPY  03/06/12   sigmoid polyp; repeat 10 years; Dr. Tora Duck  . LUNG SURGERY  1987   endometriosis in lungs  . REDUCTION MAMMAPLASTY  1994  . SUPRACLAVICAL NODE BIOPSY Left 01/14/2016   Procedure: LEFT SUPRACLAVICAL NODE BIOPSY;  Surgeon: Melrose Nakayama, MD;  Location: [facility];  Service: Thoracic;  Laterality: Left;  . TONSILLECTOMY     Social History   Occupational History  . Occupation: Midwife  Tobacco Use  . Smoking status: Never Smoker  . Smokeless tobacco: Never Used  Substance and Sexual Activity  . Alcohol use: Yes    Alcohol/week: 0.0 standard drinks    Comment: rare wine, maybe 2-3 times years  . Drug use: No  . Sexual activity: Not on file

## 2019-05-26 ENCOUNTER — Other Ambulatory Visit: Payer: 59

## 2019-05-26 ENCOUNTER — Telehealth: Payer: Self-pay | Admitting: Internal Medicine

## 2019-05-26 DIAGNOSIS — Z20822 Contact with and (suspected) exposure to covid-19: Secondary | ICD-10-CM

## 2019-05-26 NOTE — Telephone Encounter (Signed)
Spoke with the patient about possible Covid-19 exposure at 05/23/19 office visit to [facility]. RN explained the free Covid-19 screening offered for this exposure. Notified that she and her driver should wear a masks and the drive-up screening will take place at the old [facility] on [facility]. Patient chose to come 05/26/19 @ 1400 to the [place] location.

## 2019-05-27 LAB — NOVEL CORONAVIRUS, NAA: SARS-CoV-2, NAA: NOT DETECTED

## 2019-05-30 ENCOUNTER — Telehealth: Payer: Self-pay | Admitting: Medical

## 2019-05-30 NOTE — Telephone Encounter (Signed)
Informed pt that her COVID test was negative per shane pt had test done she went to have stiches removed at the ortho office and they called her and informed her that some of the employees had been tested positive for the COVID so they said she needed to go get tested, she states she is not having any symptoms pt can be reached at [phone]

## 2019-06-04 ENCOUNTER — Other Ambulatory Visit: Payer: Self-pay

## 2019-06-04 ENCOUNTER — Ambulatory Visit (INDEPENDENT_AMBULATORY_CARE_PROVIDER_SITE_OTHER): Payer: 59 | Admitting: Medical

## 2019-06-04 ENCOUNTER — Encounter: Payer: Self-pay | Admitting: Medical

## 2019-06-04 VITALS — BP 142/86 | HR 89 | Temp 97.4°F | Ht 64.0 in | Wt 180.0 lb

## 2019-06-04 DIAGNOSIS — R102 Pelvic and perineal pain: Secondary | ICD-10-CM

## 2019-06-04 DIAGNOSIS — Z79899 Other long term (current) drug therapy: Secondary | ICD-10-CM

## 2019-06-04 DIAGNOSIS — Z1239 Encounter for other screening for malignant neoplasm of breast: Secondary | ICD-10-CM

## 2019-06-04 DIAGNOSIS — E1169 Type 2 diabetes mellitus with other specified complication: Secondary | ICD-10-CM

## 2019-06-04 DIAGNOSIS — D862 Sarcoidosis of lung with sarcoidosis of lymph nodes: Secondary | ICD-10-CM | POA: Diagnosis not present

## 2019-06-04 DIAGNOSIS — E785 Hyperlipidemia, unspecified: Secondary | ICD-10-CM

## 2019-06-04 DIAGNOSIS — M533 Sacrococcygeal disorders, not elsewhere classified: Secondary | ICD-10-CM

## 2019-06-04 DIAGNOSIS — E118 Type 2 diabetes mellitus with unspecified complications: Secondary | ICD-10-CM | POA: Diagnosis not present

## 2019-06-04 DIAGNOSIS — I10 Essential (primary) hypertension: Secondary | ICD-10-CM

## 2019-06-04 DIAGNOSIS — E1159 Type 2 diabetes mellitus with other circulatory complications: Secondary | ICD-10-CM

## 2019-06-04 DIAGNOSIS — I152 Hypertension secondary to endocrine disorders: Secondary | ICD-10-CM

## 2019-06-04 NOTE — Patient Instructions (Signed)
  Recommendations  Diabetes  Continue Metformin XR 500 mg daily  Continue Humalog mix 75/25, 10-15 units 3 times a day with meals  Continue checking your blood sugars fasting in the morning with goal less than 130  Glad to see you are off prednisone at this time  Continue regular exercise like you are doing  Vaccine recommendations.  Please call your insurance to check the following insurance coverage as you are due for all of these below:  Pneumococcal 23 vaccine  Shingrix vaccine, 2 shots 2 months apart  Tdap vaccine  High blood pressure-continue current medication  Sarcoidosis-managed by sarcoid specialist  High cholesterol-continue current medication  Coccyx pain - we will refer for CT pelvis   Please call to schedule your mammogram.  [facility]  [phone] [address] [place], [place] [address]

## 2019-06-04 NOTE — Progress Notes (Signed)
This visit type was conducted due to national recommendations for restrictions regarding the COVID-19 Pandemic (e.g. social distancing) in an effort to limit this patient's exposure and mitigate transmission in our community.  Due to their co-morbid illnesses, this patient is at least at moderate risk for complications without adequate follow up.  This format is felt to be most appropriate for this patient at this time.    Documentation for virtual audio and video telecommunications through Zoom encounter:  The patient was located at home. The provider was located in the office. The patient did consent to this visit and is aware of possible charges through their insurance for this visit.  The other persons participating in this telemedicine service were none. Time spent on call was 25 minutes and in review of previous records >30mnutes total.  This virtual service is not related to other E/M service within previous 7 days.    Subjective: Chief Complaint  Patient presents with  . DM    DM follow up sugar 118 this am    Virtual consult today for med check.  Diabetes-compliant with metformin XR 5 mg daily, Humalog mix 75/25 unit on average 10 to 15 units with meals 3 times a day.  Blood sugars been running 98-170 in the past week but just got back off prednisone within the last 2 weeks.  No foot concerns.  No polyuria, polydipsia, no weight changes.  She has questions about metformin safety.  She had hospitalization a while back due to dehydration on Ozempic  Sarcoidosis-managed by specialist, just taken off prednisone recently.  Has been on and off prednisone in the past with over the last few years  Hypertension-compliant with amlodipine 10 mg daily, lisinopril HCT 20/25 mg daily  Hyperlipidemia compliant with medication  Exercise - [place] 3 miles, 30-45 min, daily  No other aggravating or relieving factors. No other complaint.   Past Medical History:  Diagnosis  Date  . 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 Biceps tendonitis 2/12   Dr. GGladstone Lighter . History of blood transfusion 1988   "related to hysterectomy" (09/27/2018)  . History of mammogram 05/25/12   stable, no suspicious finding, extremely dense breast tissue  . Hyperlipidemia   . Hypertension   . Neutropenia, unspecified ([place] 02/2013   slight, likely ethnicity related  . Sarcoidosis    "lung, liver, eyes, skin"   . Seasonal allergies   . Shortness of breath dyspnea    with exertion   . Supraspinatus tendonitis 2/12   Dr. GGladstone Lighter . Type II diabetes mellitus ([place]   . Wears glasses    Current Outpatient Medications on File Prior to Visit  Medication Sig Dispense Refill  . amLODipine (NORVASC) 10 MG tablet Take 1 tablet (10 mg total) by mouth daily. 90 tablet 3  . azaTHIOprine (IMURAN) 50 MG tablet Take 50 mg by mouth daily.    . cholecalciferol (VITAMIN D) 1000 units tablet Take 1 tablet (1,000 Units total) by mouth daily. 90 tablet 3  . glucose blood (ONE TOUCH ULTRA TEST) test strip USE AS DIRECTED 100 each 2  . HUMALOG MIX 75/25 KWIKPEN (75-25) 100 UNIT/ML Kwikpen Inject 12 Units into the skin 3 (three) times daily. 15 mL 2  . Insulin Pen Needle (BD PEN NEEDLE NANO U/F) 32G X 4 MM MISC INJECT THREE TIMES DAILY AS DIRECTED 100 each 11  . Lancets 30G MISC Test  blood sugars twice daily 100 each 2  . lisinopril-hydrochlorothiazide (PRINZIDE,ZESTORETIC) 20-25 MG tablet TAKE 1 TABLET BY MOUTH DAILY 90 tablet 0  . metFORMIN (GLUCOPHAGE-XR) 500 MG 24 hr tablet Take 1 tablet (500 mg total) by mouth daily with breakfast. 90 tablet 1  . PEPPERMINT OIL PO Take 1 drop by mouth See admin instructions. Place 1 drop into 12-16 ounces of water and drink as needed for nausea or headaches    . rosuvastatin (CRESTOR) 10 MG tablet Take 1 tablet (10 mg total) by mouth daily. 30 tablet 2  . blood glucose meter kit and supplies  KIT Dispense based on patient and insurance preference. Use up to four times daily as directed. (FOR ICD-9 250.00, 250.01). (Patient not taking: Reported on 06/04/2019) 1 each 0  . HYDROcodone-acetaminophen (NORCO) 5-325 MG tablet Take 1 tablet by mouth every 6 (six) hours as needed for moderate pain. (Patient not taking: Reported on 06/04/2019) 20 tablet 0  . ibuprofen (ADVIL,MOTRIN) 800 MG tablet     . ondansetron (ZOFRAN) 4 MG tablet Take 1 tablet (4 mg total) by mouth every 8 (eight) hours as needed for nausea or vomiting. (Patient not taking: Reported on 06/04/2019) 20 tablet 0  . traMADol (ULTRAM) 50 MG tablet Take 1 tablet (50 mg total) by mouth every 12 (twelve) hours as needed (pain). (Patient not taking: Reported on 02/25/2019) 30 tablet 0  . valACYclovir (VALTREX) 1000 MG tablet Take 1 tablet (1,000 mg total) by mouth 3 (three) times daily. (Patient not taking: Reported on 06/04/2019) 21 tablet 0   No current facility-administered medications on file prior to visit.    ROS as in subjective   Objective: BP (!) 142/86   Pulse 89   Temp (!) 97.4 F (36.3 C) (Oral)   Ht '5\' 4"'  (1.626 m)   Wt 180 lb (81.6 kg)   LMP 12/27/1987   BMI 30.90 kg/m   Wt Readings from Last 3 Encounters:  06/04/19 180 lb (81.6 kg)  05/02/19 175 lb 12.8 oz (79.7 kg)  04/18/19 174 lb 9.6 oz (79.2 kg)     Assessment: Encounter Diagnoses  Name Primary?  . Hypertension associated with diabetes ([place]) Yes  . 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 Hyperlipidemia associated with type 2 diabetes mellitus ([place])   . Diabetes mellitus with complication ([place])   . Coccydynia   . High risk medication use   . Pelvic and perineal pain   . Screening for breast cancer      Plan: Discussed her concerns, medications.      I reviewed labs in chart from March and April 2020.    Recommendations  Diabetes  Continue Metformin XR 500 mg daily  Continue Humalog mix 75/25, 10-15 units 3 times a  day with meals  Continue checking your blood sugars fasting in the morning with goal less than 130  Glad to see you are off prednisone at this time  Continue regular exercise like you are doing  Vaccine recommendations.  Please call your insurance to check the following insurance coverage as you are due for all of these below:  Pneumococcal 23 vaccine  Shingrix vaccine, 2 shots 2 months apart  Tdap vaccine  High blood pressure-continue current medication  Sarcoidosis-managed by sarcoid specialist  High cholesterol-continue current medication  Coccyx pain - we will refer for CT pelvis   Please call to schedule your mammogram.  [facility]  [phone] [address] [place], [place] [address]   Nikole was seen today for dm.  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

## 2019-06-04 NOTE — Progress Notes (Signed)
done

## 2019-06-06 ENCOUNTER — Other Ambulatory Visit: Payer: Self-pay | Admitting: Medical

## 2019-06-13 NOTE — Progress Notes (Signed)
Order placed

## 2019-06-20 ENCOUNTER — Telehealth: Payer: Self-pay | Admitting: Medical

## 2019-06-20 ENCOUNTER — Other Ambulatory Visit: Payer: Self-pay

## 2019-06-20 DIAGNOSIS — E118 Type 2 diabetes mellitus with unspecified complications: Secondary | ICD-10-CM

## 2019-06-20 MED ORDER — HUMALOG MIX 75/25 KWIKPEN (75-25) 100 UNIT/ML ~~LOC~~ SUPN: 12.0000 [IU] | PEN_INJECTOR | Freq: Three times a day (TID) | SUBCUTANEOUS | 3 refills | Status: DC

## 2019-06-20 NOTE — Telephone Encounter (Signed)
Sent in RX

## 2019-06-20 NOTE — Telephone Encounter (Signed)
Pt called for refills of humalog 75/25. Send to [facility].

## 2019-06-21 ENCOUNTER — Ambulatory Visit: Payer: 59 | Admitting: Orthopaedic Surgery

## 2019-06-24 ENCOUNTER — Other Ambulatory Visit: Payer: Self-pay | Admitting: Medical

## 2019-06-24 DIAGNOSIS — E1169 Type 2 diabetes mellitus with other specified complication: Secondary | ICD-10-CM

## 2019-06-25 ENCOUNTER — Encounter: Payer: Self-pay | Admitting: Orthopaedic Surgery

## 2019-06-25 ENCOUNTER — Other Ambulatory Visit: Payer: Self-pay

## 2019-06-25 ENCOUNTER — Ambulatory Visit (INDEPENDENT_AMBULATORY_CARE_PROVIDER_SITE_OTHER): Payer: 59 | Admitting: Orthopaedic Surgery

## 2019-06-25 DIAGNOSIS — G8929 Other chronic pain: Secondary | ICD-10-CM

## 2019-06-25 DIAGNOSIS — M25511 Pain in right shoulder: Secondary | ICD-10-CM | POA: Diagnosis not present

## 2019-06-25 DIAGNOSIS — Z9889 Other specified postprocedural states: Secondary | ICD-10-CM

## 2019-06-25 NOTE — Progress Notes (Signed)
Office Visit Note   Patient: Bethany Webb           Date of Birth: May 02, 1961           MRN: [id number] Visit Date: 06/25/2019              Requested by: Carlena Hurl, PA-C [address] [place],  [place] [address] PCP: Carlena Hurl, PA-C   Assessment & Plan: Visit Diagnoses:  1. History of carpal tunnel surgery of left wrist   2. Chronic right shoulder pain     Plan: Impression is 6-week status post left carpal tunnel release and doing very well.  She is released activity as tolerated.  For the right shoulder questions were answered to her satisfaction.  Pain is not bad enough to warrant an injection at this time.  We will see her back as needed.  Follow-Up Instructions: No follow-ups on file.   Orders:  No orders of the defined types were placed in this encounter.  No orders of the defined types were placed in this encounter.     Procedures: No procedures performed   Clinical Data: No additional findings.   Subjective: Chief Complaint  Patient presents with  . Left Wrist - Follow-up    Bethany Webb is 6 weeks status post left carpal tunnel release and right de Quervain's injection.  Overall she is doing well.  She is noticed near complete resolution of her carpal tunnel symptoms.  She is also got questions about her right shoulder.  She had an MRI back in 2018 which showed rotator cuff tendinosis and mild to moderate glenohumeral arthritis and synovitis.   Review of Systems   Objective: Vital Signs: LMP 12/27/1987   Physical Exam  Ortho Exam Left hand exam shows a fully healed surgical scar.  No neurovascular compromise.  Scar is nontender. Right shoulder exam shows mild limitation of the range of motion.  Manual muscle testing is grossly normal. Specialty Comments:  No specialty comments available.  Imaging: No results found.   PMFS History: Patient Active Problem List   Diagnosis Date Noted  . Pelvic and perineal pain 06/04/2019  .  History of carpal tunnel surgery of left wrist 05/23/2019  . Lymphadenitis 05/02/2019  . Herpes zoster without complication 66/44/0347  . Screening for breast cancer 03/27/2019  . Leukopenia 02/25/2019  . De Quervain's disease (tenosynovitis) 11/16/2018  . Right wrist tendonitis 11/08/2018  . Myalgia 11/08/2018  . Low back pain 11/08/2018  . Coccydynia 10/01/2018  . Diffuse pain 09/27/2018  . Carpal tunnel syndrome 08/31/2018  . High risk medication use 01/09/2018  . Need for influenza vaccination 09/21/2017  . Vaccine counseling 09/21/2017  . 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 Abnormal liver function 01/21/2016  . Elevated liver enzymes 01/05/2016  . Abnormal chest CT 01/05/2016  . Pleural plaque 01/05/2016  . Pulmonary nodule 01/05/2016  . Allergic rhinitis 10/18/2011  . Diabetes mellitus with complication ([place]) 42/59/5638  . Hyperlipidemia associated with type 2 diabetes mellitus ([place]) 10/06/2011  . Hypertension associated with diabetes ([place]) 10/06/2011   Past Medical History:  Diagnosis Date  . 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 Biceps tendonitis 2/12   Dr. Gladstone Lighter  . History of blood transfusion 1988   "related to hysterectomy" (09/27/2018)  . History of mammogram 05/25/12   stable, no  suspicious finding, extremely dense breast tissue  . Hyperlipidemia   . Hypertension   . Neutropenia, unspecified ([place]) 02/2013   slight, likely ethnicity related  . Sarcoidosis    "lung, liver, eyes, skin"   . Seasonal allergies   . Shortness of breath dyspnea    with exertion   . Supraspinatus tendonitis 2/12   Dr. Gladstone Lighter  . Type II diabetes mellitus ([place])   . Wears glasses     Family History  Problem Relation Age of Onset  . Heart disease Father 42       died of MI, CABG   . Multiple sclerosis Sister   .  Asthma Brother   . Cancer Paternal Grandmother        breast  . Diabetes Other        maternal and paternal grandmother  . Cancer Maternal Uncle        hematologic  . Hypertension Maternal Grandmother   . Cancer Maternal Grandfather        prostate  . Hypertension Maternal Grandfather   . Asthma Brother   . Eczema Sister   . Allergies Sister   . Healthy Daughter     Past Surgical History:  Procedure Laterality Date  . ABDOMINAL HYSTERECTOMY  1990   and BSO; endometriosis  . ANTERIOR CERVICAL DECOMP/DISCECTOMY FUSION N/A 06/20/2013   Procedure: ANTERIOR CERVICAL DECOMPRESSION/DISCECTOMY FUSION 1 LEVEL;  Surgeon: Ophelia Charter, MD;  Location: [facility];  Service: Neurosurgery;  Laterality: N/A;  C45 anterior cervical decompression with fusion interbody prothesis plating and bonegraft  . BACK SURGERY    . CARPAL TUNNEL RELEASE Right 06/20/2013   Procedure: CARPAL TUNNEL RELEASE;  Surgeon: Ophelia Charter, MD;  Location: [facility];  Service: Neurosurgery;  Laterality: Right;  RIGHT carpal tunnel release  . COLONOSCOPY  03/06/12   sigmoid polyp; repeat 10 years; Dr. Tora Duck  . LUNG SURGERY  1987   endometriosis in lungs  . REDUCTION MAMMAPLASTY  1994  . SUPRACLAVICAL NODE BIOPSY Left 01/14/2016   Procedure: LEFT SUPRACLAVICAL NODE BIOPSY;  Surgeon: Melrose Nakayama, MD;  Location: [facility];  Service: Thoracic;  Laterality: Left;  . TONSILLECTOMY     Social History   Occupational History  . Occupation: Midwife  Tobacco Use  . Smoking status: Never Smoker  . Smokeless tobacco: Never Used  Substance and Sexual Activity  . Alcohol use: Yes    Alcohol/week: 0.0 standard drinks    Comment: rare wine, maybe 2-3 times years  . Drug use: No  . Sexual activity: Not on file

## 2019-07-01 ENCOUNTER — Telehealth: Payer: Self-pay | Admitting: Medical

## 2019-07-01 DIAGNOSIS — M533 Sacrococcygeal disorders, not elsewhere classified: Secondary | ICD-10-CM

## 2019-07-01 NOTE — Telephone Encounter (Signed)
We had set up CT pelvis for ongoing coccyx pain.  Insurance declined to cover CT.  Since this pain continues and insurance has denied the request, please refer to gynecology for further evaluation.

## 2019-07-02 ENCOUNTER — Other Ambulatory Visit: Payer: Self-pay

## 2019-07-02 ENCOUNTER — Other Ambulatory Visit: Payer: Self-pay | Admitting: Medical

## 2019-07-02 DIAGNOSIS — M533 Sacrococcygeal disorders, not elsewhere classified: Secondary | ICD-10-CM

## 2019-07-02 DIAGNOSIS — R102 Pelvic and perineal pain: Secondary | ICD-10-CM

## 2019-07-02 MED ORDER — NYSTATIN 100000 UNIT/GM EX CREA: 1.0000 "application " | TOPICAL_CREAM | Freq: Two times a day (BID) | CUTANEOUS | 1 refills | Status: AC

## 2019-07-02 NOTE — Progress Notes (Signed)
error 

## 2019-07-02 NOTE — Telephone Encounter (Signed)
Patient notified and she cancelled her appointment for 07-03-19

## 2019-07-02 NOTE — Telephone Encounter (Signed)
Patient notified of recommendations.  She has went to Dr Sabra Heck some years back.    She also wants to know if you will call in the fungal cream for her butt rash that she has had before?

## 2019-07-02 NOTE — Telephone Encounter (Signed)
I sent Nystatin cream antifungal.  Not sure if this is the one she is referring too.

## 2019-07-03 ENCOUNTER — Ambulatory Visit: Payer: 59 | Admitting: Medical

## 2019-07-04 ENCOUNTER — Other Ambulatory Visit: Payer: Self-pay | Admitting: Medical

## 2019-07-05 ENCOUNTER — Encounter: Payer: Self-pay | Admitting: Obstetrics & Gynecology

## 2019-07-05 ENCOUNTER — Other Ambulatory Visit: Payer: Self-pay | Admitting: Obstetrics & Gynecology

## 2019-07-05 ENCOUNTER — Ambulatory Visit: Payer: 59 | Admitting: Obstetrics & Gynecology

## 2019-07-05 ENCOUNTER — Other Ambulatory Visit: Payer: Self-pay

## 2019-07-05 VITALS — BP 138/76 | HR 82 | Temp 97.1°F | Resp 14 | Ht 63.0 in | Wt 182.1 lb

## 2019-07-05 DIAGNOSIS — N766 Ulceration of vulva: Secondary | ICD-10-CM | POA: Diagnosis not present

## 2019-07-05 NOTE — Progress Notes (Signed)
58 y.o. G2P1 Single Black or [place] American female here for new patient visit.  She was referred from [facility]. [facility].  She fell in February in a neighbor's yard and fell on some roots.  She had pain immediately.  She reports sitting in particular positions can cause a lot of pain.  She has used a donut pillow and heat.  She hasn't really tried anti-inflammatories.    Denies vaginal bleeding.  She does have some vaginal discharge.  Denies itching or odor.    Has a recurrent irritated lesion in gluteal cleft that is bothering her today.  She does have nystatin for it and it seems to help some.  Would like me to assess this as well.  Patient's last menstrual period was 12/27/1987.          Sexually active: No.  The current method of family planning is status post hysterectomy.    Exercising: Yes.    walking Smoker:  no  Health Maintenance: Pap:  1989- prior to hysterectomy  History of abnormal Pap:  no MMG:  ?2017 per patient- scheduled 07-30-2019 Colonoscopy:  06-19-17 normal, f/u 5 years. Dr. Paulita Fujita BMD:   no TDaP:  2007- PCP  Pneumonia vaccine(s):  no Shingrix:   no Hep C testing: years ago per patient Screening Labs: PCP   reports that she has never smoked. She has never used smokeless tobacco. She reports current alcohol use. She reports that she does not use drugs.  Past Medical History:  Diagnosis Date  . 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 Biceps tendonitis 2/12   Dr. Gladstone Lighter  . History of blood transfusion 1988   "related to hysterectomy" (09/27/2018)  . History of mammogram 05/25/12   stable, no suspicious finding, extremely dense breast tissue  . Hyperlipidemia   . Hypertension   . Neutropenia, unspecified ([place]) 02/2013   slight, likely ethnicity related  . Sarcoidosis    "lung, liver, eyes, skin"   . Seasonal allergies   . Shortness of breath dyspnea    with exertion   . Supraspinatus  tendonitis 2/12   Dr. Gladstone Lighter  . Type II diabetes mellitus ([place])   . Wears glasses     Past Surgical History:  Procedure Laterality Date  . ABDOMINAL HYSTERECTOMY  1990   and BSO; endometriosis  . ANTERIOR CERVICAL DECOMP/DISCECTOMY FUSION N/A 06/20/2013   Procedure: ANTERIOR CERVICAL DECOMPRESSION/DISCECTOMY FUSION 1 LEVEL;  Surgeon: Ophelia Charter, MD;  Location: [facility];  Service: Neurosurgery;  Laterality: N/A;  C45 anterior cervical decompression with fusion interbody prothesis plating and bonegraft  . BACK SURGERY    . CARPAL TUNNEL RELEASE Right 06/20/2013   Procedure: CARPAL TUNNEL RELEASE;  Surgeon: Ophelia Charter, MD;  Location: [facility];  Service: Neurosurgery;  Laterality: Right;  RIGHT carpal tunnel release  . COLONOSCOPY  03/06/12   sigmoid polyp; repeat 10 years; Dr. Tora Duck  . LUNG SURGERY  1987   endometriosis in lungs  . REDUCTION MAMMAPLASTY  1994  . SUPRACLAVICAL NODE BIOPSY Left 01/14/2016   Procedure: LEFT SUPRACLAVICAL NODE BIOPSY;  Surgeon: Melrose Nakayama, MD;  Location: [facility];  Service: Thoracic;  Laterality: Left;  . TONSILLECTOMY      Current Outpatient Medications  Medication Sig Dispense Refill  . amLODipine (NORVASC) 10 MG tablet Take 1 tablet (10 mg total) by mouth daily. 90 tablet 3  . azaTHIOprine (IMURAN) 50 MG  tablet Take 50 mg by mouth daily.    . blood glucose meter kit and supplies KIT Dispense based on patient and insurance preference. Use up to four times daily as directed. (FOR ICD-9 250.00, 250.01). 1 each 0  . cholecalciferol (VITAMIN D) 1000 units tablet Take 1 tablet (1,000 Units total) by mouth daily. 90 tablet 3  . HUMALOG MIX 75/25 KWIKPEN (75-25) 100 UNIT/ML Kwikpen Inject 12 Units into the skin 3 (three) times daily. 15 mL 3  . ibuprofen (ADVIL,MOTRIN) 800 MG tablet     . Insulin Pen Needle (BD PEN NEEDLE NANO U/F) 32G X 4 MM MISC INJECT THREE TIMES DAILY AS DIRECTED 100 each 11  . Lancets 30G MISC Test blood  sugars twice daily 100 each 2  . lisinopril-hydrochlorothiazide (ZESTORETIC) 20-25 MG tablet TAKE 1 TABLET BY MOUTH DAILY 90 tablet 0  . metFORMIN (GLUCOPHAGE-XR) 500 MG 24 hr tablet Take 1 tablet (500 mg total) by mouth daily with breakfast. 90 tablet 1  . nystatin cream (MYCOSTATIN) Apply 1 application topically 2 (two) times daily. 30 g 1  . ONETOUCH ULTRA test strip USE AS DIRECTED 100 each 1  . PEPPERMINT OIL PO Take 1 drop by mouth See admin instructions. Place 1 drop into 12-16 ounces of water and drink as needed for nausea or headaches    . rosuvastatin (CRESTOR) 10 MG tablet TAKE 1 TABLET(10 MG) BY MOUTH DAILY 30 tablet 2  . valACYclovir (VALTREX) 1000 MG tablet Take 1 tablet (1,000 mg total) by mouth 3 (three) times daily. (Patient not taking: Reported on 06/04/2019) 21 tablet 0   No current facility-administered medications for this visit.     Family History  Problem Relation Age of Onset  . Heart disease Father 82       died of MI, CABG   . Multiple sclerosis Sister   . Asthma Brother   . Cancer Paternal Grandmother        breast  . Hypertension Paternal Grandmother   . Diabetes Other        maternal and paternal grandmother  . Cancer Maternal Uncle        hematologic  . Hypertension Maternal Grandmother   . Cancer Maternal Grandfather        prostate  . Hypertension Maternal Grandfather   . Asthma Brother   . Eczema Sister   . Allergies Sister   . Healthy Daughter     Review of Systems  Musculoskeletal:       Tailbone pain    All other systems reviewed and are negative.   Exam:   BP 138/76 (BP Location: Left Arm, Patient Position: Sitting, Cuff Size: Large)   Pulse 82   Temp (!) 97.1 F (36.2 C) (Temporal)   Resp 14   Ht _0  (1.6 m)   Wt 182 lb 1.6 oz (82.6 kg)   LMP 12/27/1987   BMI 32.26 kg/m    Height: _1  (160 cm)  Ht Readings from Last 3 Encounters:  07/05/19 _2  (1.6 m)  06/04/19 _3  (1.626 m)  05/02/19 _4  (1.626 m)    General  appearance: alert, cooperative and appears stated age Head: Normocephalic, without obvious abnormality, atraumatic Flank:  No CVA tenderness Abdomen: soft, non-tender; bowel sounds normal; no masses,  no organomegaly Extremities: extremities normal, atraumatic, no cyanosis or edema Skin: Skin color, texture, turgor normal. No rashes or lesions Lymph nodes: Cervical, supraclavicular, and axillary nodes normal. No abnormal inguinal nodes palpated Neurologic: Grossly  normal   Pelvic: External genitalia:  no lesions however there are two ulcerated lesions in the proximal portion of the gluteal cleft              Urethra:  normal appearing urethra with no masses, tenderness or lesions              Bartholins and Skenes: normal                 Vagina: normal appearing vagina with normal color and discharge, no lesions              Cervix: absent              Pap taken: No. Bimanual Exam:  Uterus:  uterus absent              Adnexa: normal adnexa               Rectovaginal: Confirms               Anus:  normal sphincter tone, no lesions    Pelvic floor:  No pelvic floor tenderness to palpation  Chaperone was present for exam.  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  If positive, will plan to treat with daily valtrex for 6 months due to frequency of recurrence at this time.   Pelvic MRI needs to be ordered due to coccyx pain.  If insurance will not cover, will start with x-rays first.  Pelvic PT sometimes indicated with coccyx pain but not today as her pelvic floor exam is normal.  May need ortho referral as well depending on results of MRI.

## 2019-07-07 LAB — HERPES SIMPLEX VIRUS(HSV) DNA BY PCR: HSV 2 DNA: NEGATIVE

## 2019-07-07 LAB — HSV DNA BY PCR (REFERENCE LAB): HSV-1 DNA: NEGATIVE

## 2019-07-08 ENCOUNTER — Telehealth: Payer: Self-pay | Admitting: *Deleted

## 2019-07-08 DIAGNOSIS — M533 Sacrococcygeal disorders, not elsewhere classified: Secondary | ICD-10-CM

## 2019-07-08 NOTE — Telephone Encounter (Signed)
Spoke with patient, advised as seen below per [facility]. Patient states she will plan to go on 07/12/19. Patient is aware once results reviewed by Dr. Sabra Heck our office will contact her to advise. Patient verbalizes understanding and is agreeable.   Routing to provider for final review. Patient is agreeable to disposition. Will close encounter.   Cc: Lerry Liner, Magdalene Patricia

## 2019-07-08 NOTE — Telephone Encounter (Signed)
Spoke with Denton Ar at [facility]. Confirmed order for xray, [id number] Sacrum/Coccyx with instructions to include PA and lateral, sitting and standing. Patient can walk in to either [facility] location from 8am to 4:30pm, no appt needed.

## 2019-07-08 NOTE — Telephone Encounter (Signed)
Megan Salon, MD  Bethany Logan, RN        [name],  Pt was seen on Friday. She had a fall with possible coccyx injury several months ago. She is still in pain. She needs x rays done first. I was thinking we should do a pelvic MRI but will need the x rays done first.   I hope the [facility] has a protocol for this but I do want PA and lateral views and she should have sitting and standing views. Can you call [facility] and get this scheduled? Thanks.

## 2019-07-12 ENCOUNTER — Ambulatory Visit
Admission: RE | Admit: 2019-07-12 | Discharge: 2019-07-12 | Disposition: A | Discharge status: Home / Self Care | Payer: 59 | Source: Ambulatory Visit | Attending: Obstetrics & Gynecology | Admitting: Obstetrics & Gynecology

## 2019-07-12 DIAGNOSIS — M533 Sacrococcygeal disorders, not elsewhere classified: Secondary | ICD-10-CM

## 2019-07-15 ENCOUNTER — Telehealth: Payer: Self-pay | Admitting: *Deleted

## 2019-07-15 DIAGNOSIS — R102 Pelvic and perineal pain: Secondary | ICD-10-CM

## 2019-07-15 DIAGNOSIS — M533 Sacrococcygeal disorders, not elsewhere classified: Secondary | ICD-10-CM

## 2019-07-15 NOTE — Telephone Encounter (Signed)
Spoke with Bethany Webb at [facility], confirmed MRI pelvis w/wo contrast for coccydynia.   Call to patient, advised as seen below per Dr. Sabra Heck. Advised I will place order for MRI pelvis /wo contrast to [facility], they will contact you directly to schedule. Once scheduled, our office will precert. Patient verbalizes understanding and is agreeable.   Routing to [employer] for [employer].    Cc: Dr. Sabra Heck

## 2019-07-15 NOTE — Telephone Encounter (Signed)
-----   Message from Megan Salon, MD sent at 07/12/2019  5:26 PM EDT ----- Please let pt know the x ray was normal.  Will need to move ahead to pelvic MRI due to coccyx pain.  Please call [facility] to make sure we order it correctly.  Thanks.

## 2019-07-18 LAB — HM DIABETES EYE EXAM

## 2019-07-30 ENCOUNTER — Other Ambulatory Visit: Payer: Self-pay

## 2019-07-30 ENCOUNTER — Ambulatory Visit
Admission: RE | Admit: 2019-07-30 | Discharge: 2019-07-30 | Disposition: A | Discharge status: Home / Self Care | Payer: 59 | Source: Ambulatory Visit | Attending: Medical | Admitting: Medical

## 2019-07-30 DIAGNOSIS — Z1239 Encounter for other screening for malignant neoplasm of breast: Secondary | ICD-10-CM

## 2019-07-31 ENCOUNTER — Telehealth: Payer: Self-pay | Admitting: *Deleted

## 2019-07-31 DIAGNOSIS — M533 Sacrococcygeal disorders, not elsewhere classified: Secondary | ICD-10-CM

## 2019-07-31 NOTE — Telephone Encounter (Signed)
Fax received from [facility] regarding MRI of pelvis w/wo contrast not approved. Reviewed with Dr. Sabra Heck, recommends referral to orthopedic for further evaluation of coccyx pain.   Call to patient to notify, advised RN will call to cancel MRI, patient agreeable. Patient is established at [facility] with Dr. Erlinda Hong, requesting referral. Advised patient she is established can call to schedule, patient request referral, order placed. Advised our referral coordinator will f/u with appt details once scheduled. Patient verbalizes understanding and is agreeable.   Call placed to [facility], spoke with Manus Gunning, MRI of pelvis cancelled.   Routing to Dr. Lestine Box.   Encounter closed.   Cc: Lerry Liner, Magdalene Patricia

## 2019-08-02 ENCOUNTER — Other Ambulatory Visit: Payer: Self-pay

## 2019-08-02 ENCOUNTER — Encounter: Payer: Self-pay | Admitting: Medical

## 2019-08-02 ENCOUNTER — Ambulatory Visit: Payer: 59 | Admitting: Medical

## 2019-08-02 VITALS — BP 130/80 | HR 80 | Temp 98.2°F | Resp 16 | Wt 180.6 lb

## 2019-08-02 DIAGNOSIS — H109 Unspecified conjunctivitis: Secondary | ICD-10-CM | POA: Diagnosis not present

## 2019-08-02 DIAGNOSIS — Z23 Encounter for immunization: Secondary | ICD-10-CM

## 2019-08-02 DIAGNOSIS — J309 Allergic rhinitis, unspecified: Secondary | ICD-10-CM

## 2019-08-02 MED ORDER — FLUTICASONE PROPIONATE 50 MCG/ACT NA SUSP: 2.0000 | Freq: Every day | NASAL | 3 refills | Status: AC

## 2019-08-02 NOTE — Progress Notes (Signed)
Subjective: Chief Complaint  Patient presents with  . sinus    congestion, eyes X a while   Here for ongoing problems with allergies, sinus nasal and eye congestion.  Having itching and crusting eyes, every morning crusted.   Been going on about a month.   Eyes have been red, but area little better.   Using allergy eye drops OTC.   Having ongoing stuffy nose all the time, crusting in nose, but not so runny.   Has had some sores in the nose. Been using saline nasal.   Nasal congestion x a month too.   Been using [employer] brand zyrtec.  No fever, no cough, no NVD, no sore throat.  No sick contacts.  No covid contacts.   No other aggravating or relieving factors.   Here for vaccines we discussed prior, insurance covers those we discussed.  No other aggravating or relieving factors. No other complaint.    Objective: BP 130/80   Pulse 80   Temp 98.2 F (36.8 C) (Tympanic)   Resp 16   Wt 180 lb 9.6 oz (81.9 kg)   LMP 12/27/1987   SpO2 98%   BMI 31.99 kg/m   Gen: wd, wn, nad No obvious erythema about her conjunctiva, no crusting of eyelids, PERRLA, EOMI, no obvious abnormality Nares with significant turbinate edema, pink and boggy, TMs pearly, pharynx normal, head nontender  oral MMM, normal pharynx Lungs clear    Assessment: Encounter Diagnoses  Name Primary?  . Allergic rhinitis, unspecified seasonality, unspecified trigger Yes  . Conjunctivitis of both eyes, unspecified conjunctivitis type   . Need for Tdap vaccination   . Need for pneumococcal vaccination       Plan We discussed her symptoms and concerns, begin Flonase nasal, discussed recommendations below.  If not much improved within the next 1-2 weeks then call back  Patient Instructions  Allergic conjunctivitis  Allergy eye symptoms include itchy eyes, watery eyes, red eyes, crusting in the eyelids . You may use over-the-counter Pataday allergy eyedrops daily for these symptoms during the time period you get the  symptoms.  For a lot of people this is spring and fall, however some people get these symptoms year round . You can use warm wet cloth early eyes for comfort if you have crusting of the eyelids or irritation . Take a shower every day in the evening to remove pollen and dust off of your face and body  Allergic rhinitis Allergy symptoms can include runny nose, nasal congestion, stuffed up nose, sneezing, postnasal drainage, raspy throat . You may use over-the-counter Zyrtec daily at bedtime during the time period you get the symptoms.  For a lot of people this is spring and fall, however some people get these symptoms year round . You may use a nasal steroid spray to reduce the swelling stopped up sensation in your nose.  I prescribed FLonase today.   . You may use salt water gargles to clear mucus from your throat   Consider using nasal saline flush in the evening while in the shower.   If not improving within a week, call back     Counseled on the Tdap (tetanus, diptheria, and acellular pertussis) vaccine.  Vaccine information sheet given. Tdap vaccine given after consent obtained.  Counseled on the pneumococcal vaccine.  Vaccine information sheet given.  Pneumococcal vaccine PPSV23 given after consent obtained.  Kenzleigh was seen today for sinus.  Diagnoses and all orders for this visit:  Allergic rhinitis, unspecified seasonality, unspecified  trigger  Conjunctivitis of both eyes, unspecified conjunctivitis type  Need for Tdap vaccination  Need for pneumococcal vaccination  Other orders -     fluticasone (FLONASE) 50 MCG/ACT nasal spray; Place 2 sprays into both nostrils daily. -     Tdap vaccine greater than or equal to 7yo IM -     Pneumococcal polysaccharide vaccine 23-valent greater than or equal to 2yo subcutaneous/IM

## 2019-08-02 NOTE — Patient Instructions (Addendum)
Allergic conjunctivitis  Allergy eye symptoms include itchy eyes, watery eyes, red eyes, crusting in the eyelids . You may use over-the-counter Pataday allergy eyedrops daily for these symptoms during the time period you get the symptoms.  For a lot of people this is spring and fall, however some people get these symptoms year round . You can use warm wet cloth early eyes for comfort if you have crusting of the eyelids or irritation . Take a shower every day in the evening to remove pollen and dust off of your face and body  Allergic rhinitis Allergy symptoms can include runny nose, nasal congestion, stuffed up nose, sneezing, postnasal drainage, raspy throat . You may use over-the-counter Zyrtec daily at bedtime during the time period you get the symptoms.  For a lot of people this is spring and fall, however some people get these symptoms year round . You may use a nasal steroid spray to reduce the swelling stopped up sensation in your nose.  I prescribed FLonase today.   . You may use salt water gargles to clear mucus from your throat   Consider using nasal saline flush in the evening while in the shower.   If not improving within a week, call back

## 2019-08-04 ENCOUNTER — Other Ambulatory Visit: Payer: 59

## 2019-08-06 ENCOUNTER — Encounter: Payer: Self-pay | Admitting: Orthopaedic Surgery

## 2019-08-06 ENCOUNTER — Ambulatory Visit (INDEPENDENT_AMBULATORY_CARE_PROVIDER_SITE_OTHER): Payer: 59 | Admitting: Orthopaedic Surgery

## 2019-08-06 ENCOUNTER — Other Ambulatory Visit: Payer: Self-pay

## 2019-08-06 DIAGNOSIS — S322XXA Fracture of coccyx, initial encounter for closed fracture: Secondary | ICD-10-CM | POA: Diagnosis not present

## 2019-08-06 NOTE — Progress Notes (Signed)
Office Visit Note   Patient: Bethany Webb           Date of Birth: 1961/01/15           MRN: [id number] Visit Date: 08/06/2019              Requested by: Megan Salon, MD [address] [place],  [place] [address] PCP: Carlena Hurl, PA-C   Assessment & Plan: Visit Diagnoses:  1. Closed fracture of coccyx, initial encounter [facility])     Plan: Impression is questionable coccyx fracture.  I reviewed the x-rays and there is a questionable lucency through the tip of the coccyx.  Again we discussed that there is no good treatment for this other than giving this time and treating the symptoms which fortunately are not too severe.  She will continue doing what she is doing.  I will see her back as needed.  Follow-Up Instructions: No follow-ups on file.   Orders:  No orders of the defined types were placed in this encounter.  No orders of the defined types were placed in this encounter.     Procedures: No procedures performed   Clinical Data: No additional findings.   Subjective: Chief Complaint  Patient presents with  . Lower Back - Injury    Had a fall in Feb. Pain since    Bethany Webb comes in today for evaluation of coccygeal pain.  She fell directly on her tailbone back in February and since then has been uncomfortable.  Fortunately is not uncomfortable enough to where she has to take medicines regularly.  She is using a doughnut and using heat and ice.  Denies any neurovascular symptoms.   Review of Systems  Constitutional: Negative.   HENT: Negative.   Eyes: Negative.   Respiratory: Negative.   Cardiovascular: Negative.   Endocrine: Negative.   Musculoskeletal: Negative.   Neurological: Negative.   Hematological: Negative.   Psychiatric/Behavioral: Negative.   All other systems reviewed and are negative.    Objective: Vital Signs: LMP 12/27/1987   Physical Exam Vitals signs and nursing note reviewed.  Constitutional:      Appearance: She  is well-developed.  Pulmonary:     Effort: Pulmonary effort is normal.  Skin:    General: Skin is warm.     Capillary Refill: Capillary refill takes less than 2 seconds.  Neurological:     Mental Status: She is alert and oriented to person, place, and time.  Psychiatric:        Behavior: Behavior normal.        Thought Content: Thought content normal.        Judgment: Judgment normal.     Ortho Exam Exam shows direct tenderness to the tip of the coccyx.  Otherwise exam is unremarkable. Specialty Comments:  No specialty comments available.  Imaging: No results found.   PMFS History: Patient Active Problem List   Diagnosis Date Noted  . Closed fracture of coccyx ([place]) 08/06/2019  . Conjunctivitis of both eyes 08/02/2019  . Pelvic and perineal pain 06/04/2019  . History of carpal tunnel surgery of left wrist 05/23/2019  . Lymphadenitis 05/02/2019  . Herpes zoster without complication 56/38/7564  . Screening for breast cancer 03/27/2019  . Leukopenia 02/25/2019  . De Quervain's disease (tenosynovitis) 11/16/2018  . Right wrist tendonitis 11/08/2018  . Myalgia 11/08/2018  . Low back pain 11/08/2018  . Coccydynia 10/01/2018  . Diffuse pain 09/27/2018  . Carpal tunnel syndrome 08/31/2018  .  High risk medication use 01/09/2018  . Need for influenza vaccination 09/21/2017  . Vaccine counseling 09/21/2017  . 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 Abnormal liver function 01/21/2016  . Elevated liver enzymes 01/05/2016  . Abnormal chest CT 01/05/2016  . Pleural plaque 01/05/2016  . Pulmonary nodule 01/05/2016  . Allergic rhinitis 10/18/2011  . Diabetes mellitus with complication ([place]) 81/15/7262  . Hyperlipidemia associated with type 2 diabetes mellitus ([place]) 10/06/2011  . Hypertension associated with diabetes ([place]) 10/06/2011   Past Medical  History:  Diagnosis Date  . 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 Biceps tendonitis 2/12   Dr. Gladstone Lighter  . History of blood transfusion 1988   "related to hysterectomy" (09/27/2018)  . History of mammogram 05/25/12   stable, no suspicious finding, extremely dense breast tissue  . Hyperlipidemia   . Hypertension   . Neutropenia, unspecified ([place]) 02/2013   slight, likely ethnicity related  . Sarcoidosis    "lung, liver, eyes, skin"   . Seasonal allergies   . Shortness of breath dyspnea    with exertion   . Supraspinatus tendonitis 2/12   Dr. Gladstone Lighter  . Type II diabetes mellitus ([place])   . Wears glasses     Family History  Problem Relation Age of Onset  . Heart disease Father 27       died of MI, CABG   . Multiple sclerosis Sister   . Asthma Brother   . Cancer Paternal Grandmother        breast  . Hypertension Paternal Grandmother   . Breast cancer Paternal Grandmother   . Diabetes Other        maternal and paternal grandmother  . Cancer Maternal Uncle        hematologic  . Hypertension Maternal Grandmother   . Cancer Maternal Grandfather        prostate  . Hypertension Maternal Grandfather   . Asthma Brother   . Eczema Sister   . Allergies Sister   . Healthy Daughter     Past Surgical History:  Procedure Laterality Date  . ABDOMINAL HYSTERECTOMY  1990   and BSO; endometriosis  . ANTERIOR CERVICAL DECOMP/DISCECTOMY FUSION N/A 06/20/2013   Procedure: ANTERIOR CERVICAL DECOMPRESSION/DISCECTOMY FUSION 1 LEVEL;  Surgeon: Ophelia Charter, MD;  Location: [facility];  Service: Neurosurgery;  Laterality: N/A;  C45 anterior cervical decompression with fusion interbody prothesis plating and bonegraft  . BACK SURGERY    . CARPAL TUNNEL RELEASE Right 06/20/2013   Procedure: CARPAL TUNNEL RELEASE;  Surgeon: Ophelia Charter, MD;  Location: [facility];  Service: Neurosurgery;  Laterality: Right;   RIGHT carpal tunnel release  . COLONOSCOPY  03/06/12   sigmoid polyp; repeat 10 years; Dr. Tora Duck  . LUNG SURGERY  1987   endometriosis in lungs  . REDUCTION MAMMAPLASTY  1994  . SUPRACLAVICAL NODE BIOPSY Left 01/14/2016   Procedure: LEFT SUPRACLAVICAL NODE BIOPSY;  Surgeon: Melrose Nakayama, MD;  Location: [facility];  Service: Thoracic;  Laterality: Left;  . TONSILLECTOMY     Social History   Occupational History  . Occupation: Midwife  Tobacco Use  . Smoking status: Never Smoker  . Smokeless tobacco: Never Used  Substance and Sexual Activity  . Alcohol use: Yes    Alcohol/week: 0.0 standard drinks  Comment: rare wine, maybe 2-3 times years  . Drug use: No  . Sexual activity: Not Currently    Birth control/protection: Surgical    Comment: Hysterectomy

## 2019-08-08 ENCOUNTER — Other Ambulatory Visit: Payer: Self-pay | Admitting: Family Medicine

## 2019-09-05 ENCOUNTER — Encounter: Payer: Self-pay | Admitting: Medical

## 2019-09-12 ENCOUNTER — Encounter: Payer: Self-pay | Admitting: Medical

## 2019-09-13 ENCOUNTER — Other Ambulatory Visit: Payer: Self-pay | Admitting: Medical

## 2019-09-13 DIAGNOSIS — E1169 Type 2 diabetes mellitus with other specified complication: Secondary | ICD-10-CM

## 2019-09-13 DIAGNOSIS — E785 Hyperlipidemia, unspecified: Secondary | ICD-10-CM

## 2019-10-04 ENCOUNTER — Other Ambulatory Visit: Payer: Self-pay | Admitting: Medical

## 2019-10-04 ENCOUNTER — Telehealth: Payer: Self-pay | Admitting: Medical

## 2019-10-04 ENCOUNTER — Ambulatory Visit: Payer: 59 | Admitting: Medical

## 2019-10-04 DIAGNOSIS — E118 Type 2 diabetes mellitus with unspecified complications: Secondary | ICD-10-CM

## 2019-10-04 MED ORDER — INSULIN ASPART PROT & ASPART (70-30 MIX) 100 UNIT/ML ~~LOC~~ SUSP: 12.0000 [IU] | Freq: Three times a day (TID) | SUBCUTANEOUS | 5 refills | Status: AC

## 2019-10-04 MED ORDER — HUMALOG MIX 75/25 KWIKPEN (75-25) 100 UNIT/ML ~~LOC~~ SUPN: 12.0000 [IU] | PEN_INJECTOR | Freq: Three times a day (TID) | SUBCUTANEOUS | 3 refills | Status: DC

## 2019-10-04 NOTE — Telephone Encounter (Signed)
Pt called and is requesting a refill on her insulin states she is out and will not have enough over the weekend she has a appt on Monday for a diabetes check, pt uses [facility] [place] [facility] [address]

## 2019-10-04 NOTE — Progress Notes (Signed)
   Subjective:    Patient ID: Bethany Webb, female    DOB: 12-03-1961, 58 y.o.   MRN: [id number]  HPI She is on a different insurance plan and needs Novolog  Review of Systems     Objective:   Physical Exam        Assessment & Plan:

## 2019-10-04 NOTE — Telephone Encounter (Signed)
done

## 2019-10-07 ENCOUNTER — Other Ambulatory Visit: Payer: Self-pay

## 2019-10-07 ENCOUNTER — Encounter: Payer: Self-pay | Admitting: Medical

## 2019-10-07 ENCOUNTER — Ambulatory Visit: Payer: BC Managed Care – PPO | Admitting: Medical

## 2019-10-07 VITALS — BP 130/72 | HR 76 | Temp 98.6°F | Ht 63.0 in | Wt 187.6 lb

## 2019-10-07 DIAGNOSIS — E1159 Type 2 diabetes mellitus with other circulatory complications: Secondary | ICD-10-CM

## 2019-10-07 DIAGNOSIS — E118 Type 2 diabetes mellitus with unspecified complications: Secondary | ICD-10-CM

## 2019-10-07 DIAGNOSIS — E785 Hyperlipidemia, unspecified: Secondary | ICD-10-CM

## 2019-10-07 DIAGNOSIS — E1169 Type 2 diabetes mellitus with other specified complication: Secondary | ICD-10-CM | POA: Diagnosis not present

## 2019-10-07 DIAGNOSIS — Z79899 Other long term (current) drug therapy: Secondary | ICD-10-CM

## 2019-10-07 DIAGNOSIS — I152 Hypertension secondary to endocrine disorders: Secondary | ICD-10-CM

## 2019-10-07 DIAGNOSIS — D862 Sarcoidosis of lung with sarcoidosis of lymph nodes: Secondary | ICD-10-CM

## 2019-10-07 DIAGNOSIS — I10 Essential (primary) hypertension: Secondary | ICD-10-CM

## 2019-10-07 MED ORDER — FIASP FLEXTOUCH 100 UNIT/ML ~~LOC~~ SOPN: 15.0000 [IU] | PEN_INJECTOR | Freq: Three times a day (TID) | SUBCUTANEOUS | 0 refills | Status: AC

## 2019-10-07 NOTE — Patient Instructions (Signed)
Correction Insulin/Sliding Scale Your caregiver has decided you need insulin at home. You have been given a correctional scale (sliding scale) in case you need extra insulin when your blood sugar is too high (hyperglycemia). The following instructions will assist you in how to use that correctional scale.  WHAT IS A CORRECTIONAL SCALE (SLIDING SCALE)?  When you check your blood sugar, sometimes it will be higher than your caregiver wants it to be. You may need an extra dose of insulin to bring your blood sugar to your desired level (also known as your goal, target level, or normal level.) The correctional scale is prescribed by your caregiver based on your specific needs.   ______________________________________________________________________  INSULIN SLIDING SCALE   Use the chart below to determine the amount of your Fiasp Insulin that you will use to control your meal time blood sugar.  If your glucose before meal is less than 60, drink 4 oz of orange juice or if able, eat a piece of candy and do not use the meal time dose of insulin  If your glucose before meal is 60 -100, don't use the meal time insulin for this meal If your glucose before meal is 101-150, use  6  units of Insulin  If your glucose before meal is 151-200, use  9  units of Insulin  If your glucose before meal is 201-250, use  12  units of Insulin If your glucose before meal is 251-300, use  15  units of Insulin If your glucose before meal is 301-350, use  18  units of Insulin If your glucose before meal is 351-400, use  21  units of Insulin If your glucose before meal is 451-500, use  24  units of Insulin If your glucose before meal is >500, use 29 units of Insulin and call doctor immediately  ________________________________________________________________________    WHY IS IT IMPORTANT TO KEEP YOUR BLOOD SUGAR LEVELS AT YOUR DESIRED LEVEL?  It helps to prevent long-term complications of diabetes, such as eye  disease, kidney failure, and other serious complications. WHAT TYPE OF INSULIN WILL YOU USE?  To help bring down blood sugars that are too high, your caregiver has prescribed a short-acting or a rapid-acting insulin. An example of a short-acting insulin would be Regular.  WHAT DO I NEED TO DO?   Check your blood sugar with your home blood glucose meter as recommended by your caregiver.  Using your correctional scale, find the range your blood sugar lies in.  Look for the units of insulin that matches the blood sugar range. Give yourself the dose of correctional insulin your caregiver has prescribed. Always make sure you are using the right type of insulin.  Prior to the injection make sure you have food available that you can eat in the next 15 to 30 minutes.  If your correctional insulin is rapid acting, start eating your meal within 15 minutes after you have given yourself the insulin injection. If you wait longer than 15 minutes to eat, your blood sugar might get too low.  If your correctional insulin is short acting (Regular), start eating your meal within 30 minutes after you have given yourself the insulin injection. If you wait longer than 30 minutes to eat, your blood sugar might get too low. Symptoms of low blood sugar (hypoglycemia) may include feeling shaky or weak, sweating a lot, not thinking straight, difficulty seeing, agitation, or crankiness. Check your blood sugar immediately and treat your results as directed by your  caregiver.  Keep a log of your blood sugar results with the time you took the test and the amount of insulin that you injected. This information will help your caregiver manage your medications.  Note on your log anything that may affect your blood sugars such as:  Changes in normal exercise or activity.  Changes in your normal schedule, such as staying up late, going on vacation, changing your diet, or holidays.  New medications. This includes all  medications. Some medications, even those that do not require a prescription, may cause high blood sugars.  Illness or stress.  Changes in when you actually took your medication.  Changes in your meals, such as skipping a meal, a late meal, or dining out.  Eating things that may affect blood glucose, such as snacks, larger meal portions than normal, or drinks with sugar.  Ask your caregiver any questions you have.

## 2019-10-07 NOTE — Progress Notes (Signed)
Subjective:  Bethany Webb is a 58 y.o. female who presents for Chief Complaint  Patient presents with  . Medication Management    discuss insulin and prednsione      Here for elevated blood sugars.  She has a history of diabetes, history of sarcoidosis.  Over the last few years she has had various times in which her sarcoid doctor had to add steroid/prednisone.  When she is not on her prednisone she seems to do okay with managing blood sugars with sugars under 120, at goal.  But during the short periods where she is on steroids her sugars get out of control quickly in the 200 -300 range.  She did started back on prednisone almost 2 weeks ago and is supposed to be on this for a full month.  She ran out of her NovoLog 70/30 mix insulin over the weekend.  To get this refilled she is going to cost over $200 out-of-pocket.  She wants to try something else.  Currently denies low sugar readings, no polyuria, no polydipsia, no blurred vision, no recent weight changes.  She is checking her sugars regularly.  Prior diabetes therapy includes Metformin, higher doses cause constipation, zig duo, glipizide, Januvia, Ozempic however she was only on Ozempic for a brief period you this unusual side effects.  She ultimately was stopped off of Ozempic given this was the  potential trigger for her some of her side effects.  No other aggravating or relieving factors.    No other c/o.  The following portions of the patient's history were reviewed and updated as appropriate: allergies, current medications, past family history, past medical history, past social history, past surgical history and problem list.  ROS Otherwise as in subjective above  Objective: BP 130/72   Pulse 76   Temp 98.6 F (37 C)   Ht 5\' 3"  (1.6 m)   Wt 187 lb 9.6 oz (85.1 kg)   LMP 12/27/1987   SpO2 98%   BMI 33.23 kg/m   General appearance: alert, no distress, well developed, well nourished Neck: supple, no lymphadenopathy, no  thyromegaly, no masses Heart: RRR, normal S1, S2, no murmurs Lungs: CTA bilaterally, no wheezes, rhonchi, or rales Pulses: 2+ radial pulses, 2+ pedal pulses, normal cap refill Ext: no edema     Assessment: Encounter Diagnoses  Name Primary?  . Diabetes mellitus with complication ([place]) Yes  . Hyperlipidemia associated with type 2 diabetes mellitus ([place])   . Hypertension associated with diabetes ([place])   . High risk medication use   . 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  We had several samples of Fiasp.  She will begin Fiasp short acting insulin with meals, and I gave her a sliding scale to use for dosing.  We discussed risks of uncontrolled glucose, possible complications of diabetes.  Begin Fiasp.  Stop NovoLog 70/30 mix.  Work on improving diet in the short-term.  Counseled on diet and exercise.  Follow-up in 1  Month.  Bethany Webb was seen today for medication management.  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    Follow up: 48mo

## 2019-10-12 ENCOUNTER — Telehealth: Payer: Self-pay | Admitting: Medical

## 2019-10-12 NOTE — Telephone Encounter (Signed)
P.A. HUMALOG, pt was switched to Novolog, covered medication

## 2019-10-21 ENCOUNTER — Other Ambulatory Visit: Payer: Self-pay | Admitting: Medical

## 2019-11-16 ENCOUNTER — Other Ambulatory Visit: Payer: Self-pay | Admitting: Medical

## 2019-11-27 ENCOUNTER — Other Ambulatory Visit: Payer: Self-pay

## 2019-11-27 ENCOUNTER — Ambulatory Visit: Payer: BC Managed Care – PPO | Admitting: Family Medicine

## 2019-11-27 ENCOUNTER — Encounter: Payer: Self-pay | Admitting: Family Medicine

## 2019-11-27 VITALS — BP 128/72 | HR 97 | Temp 99.3°F | Wt 183.2 lb

## 2019-11-27 DIAGNOSIS — Z23 Encounter for immunization: Secondary | ICD-10-CM

## 2019-11-27 DIAGNOSIS — B373 Candidiasis of vulva and vagina: Secondary | ICD-10-CM | POA: Diagnosis not present

## 2019-11-27 DIAGNOSIS — R52 Pain, unspecified: Secondary | ICD-10-CM

## 2019-11-27 DIAGNOSIS — B3731 Acute candidiasis of vulva and vagina: Secondary | ICD-10-CM

## 2019-11-27 MED ORDER — GABAPENTIN 100 MG PO CAPS: 100.0000 mg | ORAL_CAPSULE | Freq: Three times a day (TID) | ORAL | 3 refills | Status: AC

## 2019-11-27 MED ORDER — FLUCONAZOLE 150 MG PO TABS: 150.0000 mg | ORAL_TABLET | Freq: Once | ORAL | 0 refills | Status: AC

## 2019-11-27 NOTE — Progress Notes (Signed)
   Subjective:    Patient ID: Bethany Webb, female    DOB: [birth year], 58 y.o.   MRN: [id number]  HPI She is here for evaluation of vaginal burning sensation.  Apparently this is intermittent in nature.  She states that nystatin has worked for this in the past.  She has been off prednisone for the last 2 weeks and did not go on a insulin sliding scale.  Presently her dosage of NovoLog is 12 in the morning, 20 at lunch and 20 at dinner.  Her blood sugars run between 150 and 280.  She also states that she is having numbness of her left great toe but also a burning sensation in her feet.  She has been on gabapentin in the past and would like to try this again.   Review of Systems     Objective:   Physical Exam Alert and in no distress.  Vaginal exam shows no discharge.  KOH and wet prep was negative.       Assessment & Plan:  Yeast vaginitis - Plan: fluconazole (DIFLUCAN) 150 MG tablet  Diffuse pain - Plan: gabapentin (NEURONTIN) 100 MG capsule I will go ahead and treat her for yeast vaginitis in spite of negative testing.  We will also start her out on low-dose Neurontin to see how she tolerates this. She states that she plans to move back to [place] to help take care of her father.

## 2019-11-27 NOTE — Addendum Note (Signed)
Addended by: Elyse Jarvis on: 11/27/2019 02:41 PM   Modules accepted: Orders

## 2019-12-02 ENCOUNTER — Telehealth: Payer: Self-pay

## 2019-12-02 NOTE — Telephone Encounter (Signed)
Received fax from [facility] on [address] [place] stating that one touch ultra blue test strips needs a PA or you could change test strip prescription.

## 2019-12-02 NOTE — Telephone Encounter (Signed)
She needs to check with insurer or pharmacy to what test strips /meter is covered by her insurance.  Then we can prescribe.    Or just call in generic test strips, lancets, #100 each, 11 refills, dx: diabetes2  with complication, tests 2-3 times per day

## 2019-12-03 ENCOUNTER — Other Ambulatory Visit: Payer: Self-pay

## 2019-12-03 MED ORDER — BLOOD GLUCOSE MONITOR KIT: PACK | 0 refills | Status: AC

## 2019-12-03 NOTE — Telephone Encounter (Signed)
Unfortunately in [place] (unless you guys know a better way) I do not know of generic order in system for test strips, lancets, glucometer.   So either order or call in for strips, lancets #100 each, 11 refills, checks 2-3 times daily, glucometer #1 if needed, diagnosis: diabetes II with complication, uncontrolled.

## 2019-12-03 NOTE — Telephone Encounter (Signed)
Requested monitor and supplies that patient insurance will cover.

## 2019-12-03 NOTE — Telephone Encounter (Signed)
I called the pt. Insurance company pt. Said she had [place] and I looked in the system and we had [place] on file date issued was 2018 but they told me she did not have [place], so we may be better off sending in generic for the meter , test strips, lancets.

## 2019-12-24 ENCOUNTER — Other Ambulatory Visit: Payer: Self-pay | Admitting: Medical

## 2019-12-24 DIAGNOSIS — E1169 Type 2 diabetes mellitus with other specified complication: Secondary | ICD-10-CM
